# Patient Record
Sex: Female | Born: 1955 | Race: Black or African American | Hispanic: No | Marital: Single | Smoking: Former smoker
Health system: Southern US, Community
[De-identification: ages and names within clinical notes are randomized; demographics above are authoritative.]

## PROBLEM LIST (undated history)

## (undated) DIAGNOSIS — C801 Malignant (primary) neoplasm, unspecified: Secondary | ICD-10-CM

## (undated) HISTORY — PX: TRACHEOSTOMY: SUR1362

---

## 1999-11-04 ENCOUNTER — Encounter: Payer: Self-pay | Admitting: Emergency Medicine

## 2000-10-04 ENCOUNTER — Encounter: Payer: Self-pay | Admitting: Otolaryngology

## 2000-10-04 ENCOUNTER — Inpatient Hospital Stay (HOSPITAL_COMMUNITY): Admission: EM | Admit: 2000-10-04 | Discharge: 2000-10-05 | Payer: Self-pay | Admitting: Emergency Medicine

## 2000-10-04 ENCOUNTER — Encounter: Payer: Self-pay | Admitting: Emergency Medicine

## 2001-04-19 DIAGNOSIS — I10 Essential (primary) hypertension: Secondary | ICD-10-CM | POA: Insufficient documentation

## 2001-06-08 ENCOUNTER — Encounter: Payer: Self-pay | Admitting: *Deleted

## 2001-06-08 ENCOUNTER — Ambulatory Visit (HOSPITAL_COMMUNITY): Admission: RE | Admit: 2001-06-08 | Discharge: 2001-06-08 | Payer: Self-pay | Admitting: *Deleted

## 2002-09-15 ENCOUNTER — Emergency Department (HOSPITAL_COMMUNITY): Admission: EM | Admit: 2002-09-15 | Discharge: 2002-09-15 | Payer: Self-pay | Admitting: Emergency Medicine

## 2005-07-27 ENCOUNTER — Ambulatory Visit: Payer: Self-pay | Admitting: Internal Medicine

## 2005-09-30 ENCOUNTER — Ambulatory Visit: Payer: Self-pay | Admitting: Family Medicine

## 2005-12-28 ENCOUNTER — Ambulatory Visit: Payer: Self-pay | Admitting: Family Medicine

## 2006-05-02 ENCOUNTER — Ambulatory Visit: Payer: Self-pay | Admitting: Family Medicine

## 2006-05-02 ENCOUNTER — Encounter (INDEPENDENT_AMBULATORY_CARE_PROVIDER_SITE_OTHER): Payer: Self-pay | Admitting: Family Medicine

## 2006-05-02 ENCOUNTER — Other Ambulatory Visit: Admission: RE | Admit: 2006-05-02 | Discharge: 2006-05-02 | Payer: Self-pay | Admitting: Family Medicine

## 2006-09-28 ENCOUNTER — Ambulatory Visit: Payer: Self-pay | Admitting: Family Medicine

## 2007-03-09 ENCOUNTER — Encounter (INDEPENDENT_AMBULATORY_CARE_PROVIDER_SITE_OTHER): Payer: Self-pay | Admitting: Family Medicine

## 2007-03-14 ENCOUNTER — Encounter (INDEPENDENT_AMBULATORY_CARE_PROVIDER_SITE_OTHER): Payer: Self-pay | Admitting: Family Medicine

## 2007-05-25 ENCOUNTER — Telehealth (INDEPENDENT_AMBULATORY_CARE_PROVIDER_SITE_OTHER): Payer: Self-pay | Admitting: *Deleted

## 2007-06-08 ENCOUNTER — Encounter (INDEPENDENT_AMBULATORY_CARE_PROVIDER_SITE_OTHER): Payer: Self-pay | Admitting: *Deleted

## 2007-06-12 ENCOUNTER — Ambulatory Visit: Payer: Self-pay | Admitting: Family Medicine

## 2007-06-12 DIAGNOSIS — J309 Allergic rhinitis, unspecified: Secondary | ICD-10-CM | POA: Insufficient documentation

## 2007-06-12 DIAGNOSIS — E785 Hyperlipidemia, unspecified: Secondary | ICD-10-CM

## 2007-06-16 ENCOUNTER — Ambulatory Visit (HOSPITAL_COMMUNITY): Admission: RE | Admit: 2007-06-16 | Discharge: 2007-06-16 | Payer: Self-pay | Admitting: Family Medicine

## 2007-06-21 LAB — CONVERTED CEMR LAB
CO2: 24 meq/L (ref 19–32)
Calcium: 10 mg/dL (ref 8.4–10.5)
Cholesterol: 261 mg/dL — ABNORMAL HIGH (ref 0–200)
Eosinophils Relative: 2 % (ref 0–5)
Glucose, Bld: 98 mg/dL (ref 70–99)
HCT: 38.4 % (ref 36.0–46.0)
Hemoglobin: 12.7 g/dL (ref 12.0–15.0)
Lymphocytes Relative: 33 % (ref 12–46)
Lymphs Abs: 3.3 10*3/uL (ref 0.7–4.0)
Monocytes Absolute: 0.7 10*3/uL (ref 0.1–1.0)
Platelets: 335 10*3/uL (ref 150–400)
Sodium: 143 meq/L (ref 135–145)
Total Bilirubin: 0.5 mg/dL (ref 0.3–1.2)
Total Protein: 8.3 g/dL (ref 6.0–8.3)
Triglycerides: 253 mg/dL — ABNORMAL HIGH (ref <150)
VLDL: 51 mg/dL — ABNORMAL HIGH (ref 0–40)
WBC: 10 10*3/uL (ref 4.0–10.5)

## 2007-06-26 ENCOUNTER — Encounter: Admission: RE | Admit: 2007-06-26 | Discharge: 2007-06-26 | Payer: Self-pay | Admitting: Family Medicine

## 2007-07-04 ENCOUNTER — Encounter: Admission: RE | Admit: 2007-07-04 | Discharge: 2007-07-04 | Payer: Self-pay | Admitting: Family Medicine

## 2007-07-04 ENCOUNTER — Encounter (INDEPENDENT_AMBULATORY_CARE_PROVIDER_SITE_OTHER): Payer: Self-pay | Admitting: Diagnostic Radiology

## 2007-07-04 ENCOUNTER — Encounter (INDEPENDENT_AMBULATORY_CARE_PROVIDER_SITE_OTHER): Payer: Self-pay | Admitting: Family Medicine

## 2007-09-22 ENCOUNTER — Encounter (INDEPENDENT_AMBULATORY_CARE_PROVIDER_SITE_OTHER): Payer: Self-pay | Admitting: Family Medicine

## 2007-10-25 ENCOUNTER — Telehealth (INDEPENDENT_AMBULATORY_CARE_PROVIDER_SITE_OTHER): Payer: Self-pay | Admitting: Family Medicine

## 2008-02-20 ENCOUNTER — Encounter (INDEPENDENT_AMBULATORY_CARE_PROVIDER_SITE_OTHER): Payer: Self-pay | Admitting: Family Medicine

## 2008-02-26 ENCOUNTER — Telehealth (INDEPENDENT_AMBULATORY_CARE_PROVIDER_SITE_OTHER): Payer: Self-pay | Admitting: Family Medicine

## 2008-03-18 ENCOUNTER — Ambulatory Visit: Payer: Self-pay | Admitting: Family Medicine

## 2008-03-18 LAB — CONVERTED CEMR LAB
AST: 11 units/L (ref 0–37)
Alkaline Phosphatase: 67 units/L (ref 39–117)
BUN: 15 mg/dL (ref 6–23)
Basophils Relative: 0 % (ref 0–1)
Creatinine, Ser: 0.85 mg/dL (ref 0.40–1.20)
Eosinophils Absolute: 0.4 10*3/uL (ref 0.0–0.7)
Eosinophils Relative: 3 % (ref 0–5)
Glucose, Bld: 97 mg/dL (ref 70–99)
HCT: 39.2 % (ref 36.0–46.0)
Hemoglobin: 12.8 g/dL (ref 12.0–15.0)
MCHC: 32.7 g/dL (ref 30.0–36.0)
MCV: 88.1 fL (ref 78.0–100.0)
Monocytes Absolute: 0.8 10*3/uL (ref 0.1–1.0)
Monocytes Relative: 6 % (ref 3–12)
Neutrophils Relative %: 56 % (ref 43–77)
RBC: 4.45 M/uL (ref 3.87–5.11)

## 2008-03-22 ENCOUNTER — Encounter (INDEPENDENT_AMBULATORY_CARE_PROVIDER_SITE_OTHER): Payer: Self-pay | Admitting: Family Medicine

## 2008-03-28 ENCOUNTER — Telehealth (INDEPENDENT_AMBULATORY_CARE_PROVIDER_SITE_OTHER): Payer: Self-pay | Admitting: *Deleted

## 2008-04-03 ENCOUNTER — Encounter (INDEPENDENT_AMBULATORY_CARE_PROVIDER_SITE_OTHER): Payer: Self-pay | Admitting: *Deleted

## 2008-04-05 ENCOUNTER — Telehealth (INDEPENDENT_AMBULATORY_CARE_PROVIDER_SITE_OTHER): Payer: Self-pay | Admitting: *Deleted

## 2008-04-30 ENCOUNTER — Ambulatory Visit: Payer: Self-pay | Admitting: Cardiovascular Disease

## 2008-05-02 ENCOUNTER — Encounter (INDEPENDENT_AMBULATORY_CARE_PROVIDER_SITE_OTHER): Payer: Self-pay | Admitting: Family Medicine

## 2008-05-15 ENCOUNTER — Encounter: Payer: Self-pay | Admitting: Cardiovascular Disease

## 2008-05-15 ENCOUNTER — Ambulatory Visit: Payer: Self-pay

## 2008-06-14 ENCOUNTER — Telehealth (INDEPENDENT_AMBULATORY_CARE_PROVIDER_SITE_OTHER): Payer: Self-pay | Admitting: *Deleted

## 2008-08-02 ENCOUNTER — Encounter (INDEPENDENT_AMBULATORY_CARE_PROVIDER_SITE_OTHER): Payer: Self-pay | Admitting: Family Medicine

## 2008-08-15 ENCOUNTER — Encounter (INDEPENDENT_AMBULATORY_CARE_PROVIDER_SITE_OTHER): Payer: Self-pay | Admitting: Family Medicine

## 2008-08-16 ENCOUNTER — Encounter (INDEPENDENT_AMBULATORY_CARE_PROVIDER_SITE_OTHER): Payer: Self-pay | Admitting: Family Medicine

## 2008-09-05 ENCOUNTER — Encounter (INDEPENDENT_AMBULATORY_CARE_PROVIDER_SITE_OTHER): Payer: Self-pay | Admitting: Family Medicine

## 2008-10-01 ENCOUNTER — Encounter (INDEPENDENT_AMBULATORY_CARE_PROVIDER_SITE_OTHER): Payer: Self-pay | Admitting: Nurse Practitioner

## 2008-10-02 ENCOUNTER — Encounter (INDEPENDENT_AMBULATORY_CARE_PROVIDER_SITE_OTHER): Payer: Self-pay | Admitting: Nurse Practitioner

## 2008-11-19 ENCOUNTER — Telehealth (INDEPENDENT_AMBULATORY_CARE_PROVIDER_SITE_OTHER): Payer: Self-pay | Admitting: Nurse Practitioner

## 2008-11-20 ENCOUNTER — Encounter (INDEPENDENT_AMBULATORY_CARE_PROVIDER_SITE_OTHER): Payer: Self-pay | Admitting: *Deleted

## 2008-12-16 ENCOUNTER — Encounter (INDEPENDENT_AMBULATORY_CARE_PROVIDER_SITE_OTHER): Payer: Self-pay | Admitting: *Deleted

## 2009-01-03 ENCOUNTER — Encounter (INDEPENDENT_AMBULATORY_CARE_PROVIDER_SITE_OTHER): Payer: Self-pay | Admitting: *Deleted

## 2009-01-14 ENCOUNTER — Encounter: Payer: Self-pay | Admitting: Physician Assistant

## 2009-02-19 ENCOUNTER — Telehealth: Payer: Self-pay | Admitting: Physician Assistant

## 2009-02-19 ENCOUNTER — Ambulatory Visit (HOSPITAL_COMMUNITY): Admission: RE | Admit: 2009-02-19 | Discharge: 2009-02-19 | Payer: Self-pay | Admitting: Surgery

## 2009-02-19 ENCOUNTER — Encounter (INDEPENDENT_AMBULATORY_CARE_PROVIDER_SITE_OTHER): Payer: Self-pay | Admitting: Surgery

## 2009-02-19 ENCOUNTER — Encounter: Admission: RE | Admit: 2009-02-19 | Discharge: 2009-02-19 | Payer: Self-pay | Admitting: Surgery

## 2009-02-24 ENCOUNTER — Telehealth: Payer: Self-pay | Admitting: Cardiovascular Disease

## 2009-03-18 ENCOUNTER — Telehealth: Payer: Self-pay | Admitting: Physician Assistant

## 2009-04-22 ENCOUNTER — Ambulatory Visit: Payer: Self-pay | Admitting: Physician Assistant

## 2009-04-22 DIAGNOSIS — G4700 Insomnia, unspecified: Secondary | ICD-10-CM

## 2009-04-24 ENCOUNTER — Encounter: Payer: Self-pay | Admitting: Physician Assistant

## 2009-10-09 ENCOUNTER — Telehealth: Payer: Self-pay | Admitting: Physician Assistant

## 2009-10-15 ENCOUNTER — Ambulatory Visit: Payer: Self-pay | Admitting: Physician Assistant

## 2009-10-16 LAB — CONVERTED CEMR LAB
ALT: 19 units/L (ref 0–35)
AST: 21 units/L (ref 0–37)
Alkaline Phosphatase: 59 units/L (ref 39–117)
BUN: 17 mg/dL (ref 6–23)
Calcium: 9.9 mg/dL (ref 8.4–10.5)
Chloride: 104 meq/L (ref 96–112)
Creatinine, Ser: 0.96 mg/dL (ref 0.40–1.20)
HDL: 47 mg/dL (ref >39)
LDL Cholesterol: 157 mg/dL — ABNORMAL HIGH (ref 0–99)
Potassium: 4.2 meq/L (ref 3.5–5.3)
Total CHOL/HDL Ratio: 5.2

## 2009-11-18 ENCOUNTER — Ambulatory Visit: Payer: Self-pay | Admitting: Physician Assistant

## 2010-04-16 ENCOUNTER — Telehealth (INDEPENDENT_AMBULATORY_CARE_PROVIDER_SITE_OTHER): Payer: Self-pay | Admitting: Internal Medicine

## 2010-05-09 ENCOUNTER — Encounter: Payer: Self-pay | Admitting: Internal Medicine

## 2010-05-10 ENCOUNTER — Encounter: Payer: Self-pay | Admitting: Surgery

## 2010-05-17 LAB — CONVERTED CEMR LAB
AST: 16 units/L (ref 0–37)
Albumin: 4.8 g/dL (ref 3.5–5.2)
BUN: 10 mg/dL (ref 6–23)
Calcium: 9.6 mg/dL (ref 8.4–10.5)
Chloride: 102 meq/L (ref 96–112)
Glucose, Bld: 92 mg/dL (ref 70–99)
Potassium: 3.8 meq/L (ref 3.5–5.3)
Sodium: 141 meq/L (ref 135–145)
Total Protein: 7.9 g/dL (ref 6.0–8.3)

## 2010-05-21 NOTE — Assessment & Plan Note (Signed)
Summary: FU FOR CHOL AND BP///KT   Vital Signs:  Patient profile:   55 year old female Height:      64 inches Weight:      264 pounds BMI:     45.48 Temp:     97.9 degrees F oral Pulse rate:   96 / minute Pulse rhythm:   regular Resp:     18 per minute BP sitting:   127 / 87  (left arm) Cuff size:   large  Vitals Entered By: Armenia Shannon (November 18, 2009 9:54 AM) CC: Hypertension Management Is Patient Diabetic? No Pain Assessment Patient in pain? no       Does patient need assistance? Functional Status Self care Ambulation Normal   Primary Care Aine Strycharz:  Tereso Newcomer PA-C  CC:  Hypertension Management.  History of Present Illness: Here for f/u.  Had a hard time finding out if she was taking crestor or not.  At one point, told the nurses she was taking Tricor.  Now, states she took Crestor.  She may have missed some days.  I could never find a record that she tried anything else.  She had crestor increased to 40mg  in 2009 when her Lipid panel returned abnl.  Her LDL then was over 140.  Recently, it was 150 and patient states she was taking.  Sounds like she may be missing some doses.    Hypertension History:      She denies headache, chest pain, dyspnea with exertion, and syncope.  She notes no problems with any antihypertensive medication side effects.        Positive major cardiovascular risk factors include hyperlipidemia and hypertension.  Negative major cardiovascular risk factors include female age less than 61 years old and non-tobacco-user status.     Current Medications (verified): 1)  Lisinopril-Hydrochlorothiazide 20-25 Mg  Tabs (Lisinopril-Hydrochlorothiazide) .... Take 1 Tab By Mouth Daily 2)  Crestor 40 Mg  Tabs (Rosuvastatin Calcium) .Marland Kitchen.. 1 Tablet By Mouth Nightly For Cholesterol **needs Lab Visit** 3)  Loratadine 10 Mg  Tabs (Loratadine) .Marland Kitchen.. 1 Tablet By Mouth Daily For Allergies 4)  Flonase 50 Mcg/act  Susp (Fluticasone Propionate) .... Use 2 Sprays To  Each Nostril Once Daily (Substituted For Veramyst) 5)  Trazodone Hcl 50 Mg Tabs (Trazodone Hcl) .... One Tablet By Mouth Nightly As Needed For Sleep **pharmacy - Discontinue Ambien**  Allergies (verified): No Known Drug Allergies  Physical Exam  General:  alert, well-developed, and well-nourished.   Head:  normocephalic and atraumatic.   Neck:  tracheostomy noted Lungs:  normal breath sounds, no crackles, and no wheezes.   Heart:  regular rhythm and no murmur.   Abdomen:  soft and non-tender.   Neurologic:  alert & oriented X3 and cranial nerves II-XII intact.   Psych:  normally interactive.     Impression & Recommendations:  Problem # 1:  HYPERLIPIDEMIA (ICD-272.4) change to Simvastatin 40 will have her see dietician repeat FLP and LFTs in 3 mos  The following medications were removed from the medication list:    Crestor 40 Mg Tabs (Rosuvastatin calcium) .Marland Kitchen... 1 tablet by mouth nightly for cholesterol **needs lab visit** Her updated medication list for this problem includes:    Simvastatin 40 Mg Tabs (Simvastatin) .Marland Kitchen... Take 1 tab by mouth at bedtime for cholesterol  Problem # 2:  HYPERTENSION (ICD-401.9) controlled  Her updated medication list for this problem includes:    Lisinopril-hydrochlorothiazide 20-25 Mg Tabs (Lisinopril-hydrochlorothiazide) .Marland Kitchen... Take 1 tab by mouth daily  Problem # 3:  Preventive Health Care (ICD-V70.0)  needs CPP  schedule mammo  Orders: Mammogram (Screening) (Mammo)  Complete Medication List: 1)  Lisinopril-hydrochlorothiazide 20-25 Mg Tabs (Lisinopril-hydrochlorothiazide) .... Take 1 tab by mouth daily 2)  Loratadine 10 Mg Tabs (Loratadine) .Marland Kitchen.. 1 tablet by mouth daily for allergies 3)  Flonase 50 Mcg/act Susp (Fluticasone propionate) .... Use 2 sprays to each nostril once daily (substituted for veramyst) 4)  Trazodone Hcl 50 Mg Tabs (Trazodone hcl) .... One tablet by mouth nightly as needed for sleep **pharmacy - discontinue  ambien** 5)  Simvastatin 40 Mg Tabs (Simvastatin) .... Take 1 tab by mouth at bedtime for cholesterol  Hypertension Assessment/Plan:      The patient's hypertensive risk group is category B: At least one risk factor (excluding diabetes) with no target organ damage.  Her calculated 10 year risk of coronary heart disease is 8 %.  Today's blood pressure is 127/87.  Her blood pressure goal is < 140/90.  Patient Instructions: 1)  Please schedule a follow-up appointment in 3 months with Scott for CPP.  Come fasting for lab work. 2)  You are now on Simvastatin 40 mg at bedtime for cholesterol.  Take every day.  Please do not miss any doses. Prescriptions: SIMVASTATIN 40 MG TABS (SIMVASTATIN) Take 1 tab by mouth at bedtime for cholesterol  #30 x 5   Entered and Authorized by:   Tereso Newcomer PA-C   Signed by:   Tereso Newcomer PA-C on 11/18/2009   Method used:   Print then Give to Patient   RxID:   [id number]

## 2010-05-21 NOTE — Progress Notes (Signed)
Summary: Crestor  Phone Note [employer] of Call: Received prior auth. for crestor. I need her to: 1.  Come in for FLP and CMET. 2.  Ask if she has been taking Crestor 40 mg every day. 3.  F/u office visit. Marland Kitchen . I was supposed to see her around Feb in f/u for BP. . . .never did. Initial call taken by: Brynda Rim,  October 09, 2009 9:38 AM  Follow-up for Phone Call        Pt. is currently taking Crestor 40 mg  Daily. pt. scheduled for labs. Will schedule F/U for HTN with you when she comes for labs. Follow-up by: Gaylyn Cheers RN,  October 10, 2009 11:55 AM  Additional Follow-up for Phone Call Additional follow up Details #1::        Put PA form in your slot. Please check paper chart to see if she was ever on simvastatin or something different and why she was switched to Crestor. If tried simvastatin in past, answer yes to #1. Additional Follow-up by: Tereso Newcomer PA-C,  October 10, 2009 1:16 PM    Additional Follow-up for Phone Call Additional follow up Details #2::    Per paper chart, has never been on anything other than Crestor.  Dutch Quint RN  October 10, 2009 3:04 PM

## 2010-05-21 NOTE — Letter (Signed)
Summary: *HSN Results Follow up  [facility]  [address] [place], [place] [address]   Phone: [phone]  Fax: [phone]      04/24/2009   Catherine Erickson [address] [place], [place]  [address]   Dear  [name],                            ____S.Drinkard,FNP   ____D. Gore,FNP       ____B. McPherson,MD   ____V. Rankins,MD    ____E. Mulberry,MD    ____N. Daphine Deutscher, FNP  ____D. Reche Dixon, MD    ____K. Philipp Deputy, MD    _x___S. Alben Spittle, PA-C     This letter is to inform you that your recent test(s):  _______Pap Smear    ___x____Lab Test     _______X-ray    ____x___ is within acceptable limits  _______ requires a medication change  _______ requires a follow-up lab visit  _______ requires a follow-up visit with your provider   Comments:       _________________________________________________________ If you have any questions, please contact our office                     Sincerely,  Tereso Newcomer PA-C [facility]

## 2010-05-21 NOTE — Progress Notes (Signed)
Summary: Query:  Refill trazodone?  Phone Note Outgoing Call   Summary of Call: Last seen 11/2009.  Refill trazodone per protocol or with no refills? Initial call taken by: Dutch Quint RN,  April 16, 2010 5:02 PM  Follow-up for Phone Call        Refill until August of 2012 Follow-up by: Julieanne Manson MD,  April 21, 2010 9:04 AM  Additional Follow-up for Phone Call Additional follow up Details #1::        Noted - refills completed. Additional Follow-up by: Dutch Quint RN,  April 21, 2010 2:58 PM
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
## 2010-07-22 LAB — COMPREHENSIVE METABOLIC PANEL
ALT: 24 U/L (ref 0–35)
AST: 19 U/L (ref 0–37)
Albumin: 4.3 g/dL (ref 3.5–5.2)
Alkaline Phosphatase: 74 U/L (ref 39–117)
CO2: 30 mEq/L (ref 19–32)
Chloride: 101 mEq/L (ref 96–112)
GFR calc Af Amer: 57 mL/min — ABNORMAL LOW (ref >60)
GFR calc non Af Amer: 47 mL/min — ABNORMAL LOW (ref >60)
Potassium: 4 mEq/L (ref 3.5–5.1)
Sodium: 140 mEq/L (ref 135–145)
Total Bilirubin: 0.4 mg/dL (ref 0.3–1.2)

## 2010-07-22 LAB — DIFFERENTIAL
Basophils Absolute: 0 10*3/uL (ref 0.0–0.1)
Basophils Relative: 0 % (ref 0–1)
Eosinophils Absolute: 0.4 10*3/uL (ref 0.0–0.7)
Eosinophils Relative: 3 % (ref 0–5)
Lymphocytes Relative: 41 % (ref 12–46)
Monocytes Absolute: 0.9 10*3/uL (ref 0.1–1.0)

## 2010-07-22 LAB — CBC
MCV: 87.3 fL (ref 78.0–100.0)
Platelets: 342 10*3/uL (ref 150–400)
RBC: 4.57 MIL/uL (ref 3.87–5.11)
WBC: 11.4 10*3/uL — ABNORMAL HIGH (ref 4.0–10.5)
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
## 2010-09-04 NOTE — H&P (Signed)
[facility]  Patient:    Catherine Erickson, KRETZSCHMAR                     MRN: [id number] Adm. Date:  [id number] Attending:  Corliss Marcus                         History and Physical  HISTORY OF PRESENT ILLNESS:   Ms. Laton is a 55 year old black female, patient of [facility], who is admitted with shortness of breath. She first had some cough and shortness of breath in November of last year. She saw medical care at [facility] and a chest x-ray was reportedly normal. She was treated with a cough syrup and prednisone several times from November of 2000 until March of 2001.  Her symptoms would improve during that time and then worsen again.  Last week it started again but it was much worse.  She was treated at [facility] and treated with Claritin and albuterol.  She was wheezing at that time.  Shortness of breath worsened so she came to the emergency room today.  She denies chest pain.  Last week she did begin to note paroxysmal nocturnal dyspnea and orthopnea.  Yesterday she noted swelling in her ankles.  Since arrival in the ER she has been treated with intravenous Lasix and she has been urinating a lot and feels better.  From November of 2000 to March of 2001 her blood pressure was quite variable, high at times.  She recalls that in 1988 or 1999 her blood pressure was up at the [facility]. Discontinuation of oral contraceptives agents was recommended, however, she was restarted on them subsequently.  PAST MEDICAL HISTORY:         Surgical:  None.  ALLERGIES:                    None known.  MEDICATIONS:                  See above.  SOCIAL HISTORY:               Cigarettes, none.  Alcohol, none.  She works for [employer] in [employer] [place].  She lives with her son.  FAMILY HISTORY:               Father died, age 72 of myocardial infarction. Brother died, age 65 of apparent myocardial infarction.  Mother had hypertension.   There is diabetes remotely in the family.  REVIEW OF SYSTEMS:            Otherwise negative.  PHYSICAL EXAMINATION:  VITAL SIGNS:                  Blood pressure 130/100, pulse 16, respiratory rate 18, temperature 97.2.  She is comfortable.  HEENT:                        The eyes have pupils that are equal, round, and reactive to light.  Extraocular movements are intact.  The funduscopic examination reveals minimal arterial wiring and no AV nicking.  The ear, nose and throat are unremarkable.  NECK:                         Supple.  The thyroid is not enlarged or tender.   CHEST:  Clear to auscultation and percussion.  HEART:                        Regular rate and rhythm with a normal S1 and S2, without an S3 or S4.  There is a 2/6 holosystolic murmur at the apex.  Trace edema is present.  ABDOMEN:                      Soft and nontender without hepatosplenomegaly or mass.  NEUROLOGICAL:                 Oriented x 3.  Cranial nerves are intact.  Motor is 5/5.  LABORATORY DATA:              Pending.  ECG shows a sinus tachycardia with no acute changes.  Chest x-ray reveals a dilated heart silhouette.  There is interstitial edema.  IMPRESSION:                   New onset congestive heart failure. Differential diagnosis includes ischemia, viral, toxic, valvular, and hypertensive.  I doubt ischemic toxic.  I suspect her holosystolic murmur is mitral regurgitation and this is secondary not a primary problem.  I will obtain an echocardiogram, continue Lasix, add ACE inhibitor, beta blocker, possibly Aldactone, and possibly Coumadin depending on results of ultrasound. D:  11/04/99 TD:  11/06/99 Job: [id number]
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
## 2010-09-04 NOTE — Discharge Summary (Signed)
[place]  Patient:    AMEL, KITCH                     MRN: [id number] Adm. Date:  [id number] Disc. Date: [id number] Attending:  Corliss Marcus CC:         Richardine Service, M.D.                           Discharge Summary  ADMISSION DIAGNOSIS:  Shortness of breath.  DISCHARGE DIAGNOSES: 1. Congestive cardiomyopathy. 2. Mitral regurgitation, severe, secondary to congestive cardiomyopathy. 3. Iron deficiency anemia. 4. Elevated liver function tests presumed secondary to congestive heart    failure.  HISTORY OF PRESENT ILLNESS:  The patient is a 55 year old white female who presented to the [facility] Emergency Room with shortness of breath, cough, paroxysmal nocturnal dyspnea, and orthopnea of two weeks duration. She had had intermittent episodes of shortness of breath since October of last year.  Please see the admitting history and physical examination for details.  HOSPITAL COURSE:  The patient was admitted after a chest x-ray revealed cardiomegaly and evidence of mild pulmonary edema mainly of the interstitial type.  She was treated with Lasix and Capoten 6.25 mg daily.  Her initial blood pressure was 159/105 and her pulse was about 110.  With treatment with Lasix and Capoten, her shortness of breath improved and her heart rate dropped slightly and her blood pressure improved.  Toprol XL 25 mg one half tablet daily was added.  K-Dur was also added because of diuresis.  She was seen in consultation by Celso Sickle, M.D.  Along with the 2-D echocardiogram that I had ordered, he recommended a transesophageal echocardiogram to rule out the possibility of ruptured chorde tendineae as the cause of acute mitral regurgitation.  These studies were done.  The 2-D echocardiogram revealed an ejection fraction of 25-30%.  This was confirmed on the TEE.  There was global hypokinesis, 4+ mitral  regurgitation, and a morphologically intact mitral valve.  There was no flail segment and no myxomatous changes were noted. There were no thrombi.  She was in much improved condition and was discharged feeling much better.  DISCHARGE MEDICATIONS: 1. Lasix 40 mg daily. 2. Birth control pills as before. 3. Accupril 5 mg b.i.d. 4. Toprol XL 25 mg one half tablet daily. 5. Aspirin one daily. 6. Iron sulfate one daily. 7. Potassium in the form of K-Dur 20 mEq one daily.  DIET:  No added salt.  This was reviewed in detail with the patient.  ACTIVITY:  As tolerated.  She may return to work in two days.  FOLLOW-UP:  She will make an appointment to see Celso Sickle, M.D., next week.  Please note that at admission her hemoglobin was 11.4 with an MCV that was normal.  The RDW was elevated.  I checked folate, B12, and iron studies.  The studies all came back consistent with iron deficiency.  Therefore, she is to continue taking iron.  She did have mild elevations in liver function tests, but those improved during the hospitalization. DD:  11/07/99 TD:  11/10/99 Job: [id number]
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
## 2010-09-04 NOTE — Consult Note (Signed)
[facility]  Patient:    Catherine Erickson, Catherine Erickson                     MRN: [id number] Proc. Date: 11/05/99 Adm. Date:  [id number] Attending:  Darnelle Bos CC:         Winn Jock. Earl Gala, M.D.             [facility]                          Consultation Report  CONCLUSIONS: 1. Congestive heart failure and rather significant-sounding murmur of mitral    regurgitation (by examination).  Rule out myxomatous mitral valve    disease/mitral valve prolapse with caudal rupture causing significant    mitral regurgitation.  Rule out other etiologies of CHF, including    idiopathic/post viral cardiomyopathy, hypertension, ischemic, toxic,    and others. 2. Hypertension.  RECOMMENDATION: 1. A 2-D Doppler echocardiogram to rule out mitral valve prolapse/caudal    rupture and quantitative severity of mitral regurgitation.  TEE may be    necessary. 2. Aggressive diuresis. 3. Optimize ACE inhibitor therapy. 4. Very low-dose beta blocker therapy until more information about etiology    of CHF is obtained. 5. Main need left and right heart catheterization to evaluate full hemodynamic    consequences of the patients mitral regurgitation. 6. TSH to rule out occult thyrotoxicosis.  COMMENTS:  The patient is 55 years of age and gives the history that she abruptly developed shortness of breath in November 2000.  She was seen at [facility] and felt to have allergies, and treated with Claritin and bronchodilators.  Since November, her breathing has not been right.  She initially had exertional dyspnea.  Over the past two weeks, she has noticed orthopnea, PND, and lower extremity swelling.  She came to the emergency room last evening, where chest x-ray revealed cardiomegaly and pulmonary edema. She was admitted by Dr. Earl Gala and begun on diuresis and now feels significant better, although not normal.  She has no prior history of heart murmur,  heart disease, rheumatic fever.  She does not use drugs.  She denies ethanol consumption.  She has no history of thyroid disease.  FAMILY HISTORY:  There is a family history of heart disease with her father dying of myocardial infarction at 10 and a brother dying abruptly, possibly of a heart attack, at age 8.  HABITS:  She denies ethanol consumption and does not smoke cigarettes.  SIGNIFICANT MEDICAL ILLNESSES:  Labile blood pressure elevation.  ALLERGIES:  None.  SOCIAL HISTORY:  She is not married.  She has a 74 year old child.  She had no cardiovascular difficulties during a full-term pregnancy.  REVIEW OF SYSTEMS:  Denies hemoptysis.  Has not had palpitations.  Denies syncope.  She has noticed some abdominal swelling that has coincided with lower extremity edema.  She denies chest discomfort.  There is no history of pulmonary disease, DVT, or thromboembolism.  PHYSICAL EXAMINATION:  GENERAL:  The patient is in no acute distress.  VITAL SIGNS:  Blood pressure 138/92, heart rate 110.  There is no orthostasis or paradox.  She is afebrile with temperature of 98.7.  SKIN:  Clear.  No cyanosis is noted on examination of the nail beds.  HEENT:  Reveals pupils are equal and reactive.  No xanthelasmas noted.  NECK:  Reveals no obvious thyromegaly.  I am unable to appreciate significant JVD with  the patient sitting at 90 degrees, but lying at 45 degrees there is JVD to the angle of the jaw.  No carotid bruits are heard.  LUNGS:  Essentially clear.  No wheezing is appreciated.  CARDIAC:  Remarkable for a very active precordium.  PMI is laterally displaced into the sixth intercostal space anterior to the anterior axillary line. There is a 2-3/6 holosystolic murmur that is heard at the apex and radiates into the axilla.  I am unable to hear this murmur over the spine.  She has a summation gallop heard along the left sternal border.  No diastolic murmurs are heard.  ABDOMEN:   Reveals no obvious liver or spleen enlargement.  The abdomen is obese.  No pulsatile masses.  Bowel sounds are normal.  EXTREMITIES:  Reveal 1+ edema.  Posterior tibial, popliteal, femoral, and radial pulses are 2+.  NEUROLOGIC:  Reveals a patient who is intact with reference to memory, orientation, cranial nerves, and motor function.  LABORATORY DATA:  EKG reveals LVH with strain, left atrial enlargement, and sinus tachycardia, nonspecific T wave abnormality.  Chest x-ray reveals cardiomegaly and pulmonary edema.  Significant laboratory data includes hemoglobin of 10.5, WBC 9.0.  Platelet count 385,000.  Potassium is 4.6, BUN 9, creatinine 0.8.  CK 89.  Echo is pending.  DISCUSSION:  I am concerned that this patient abruptly developed significant mitral regurgitation in November and has developed progressive left ventricular dysfunction due to severe mitral regurgitation since that time, resulting in this present admission with congestive heart failure. Echocardiogram will help to determine the severity of mitral regurgitation. TEE may be necessary, and if we determine that there is a structural mitral valve abnormality, she may need left and right heart catheterization and consideration of mitral valve repair or replacement.  In the meantime, I would aggressively treat the patient medically with diuresis and optimization of ACE inhibitor therapy.  I would go very gingerly with beta blocker therapy until we have further information concerning the degree of mitral regurgitation, as tachycardia may be her only means of maintaining a good ______ cardiac output.  I would do a TSH to rule out occult thyrotoxicosis. DD:  11/05/99 TD:  11/05/99 Job: [id number]

## 2010-09-04 NOTE — Procedures (Signed)
[facility]  Patient:    Catherine Erickson, Catherine Erickson                     MRN: [id number] Proc. Date: 11/06/99 Adm. Date:  [id number] Disc. Date: [id number] Attending:  Corliss Marcus CC:         Celso Sickle, M.D.             Winn Jock. Earl Gala, M.D.             [facility] [phone]                           Procedure Report  PROCEDURE PERFORMED:  Transesophageal echocardiography.  SURGEON:  Francisca December, M.D.  ANESTHESIA:  INDICATIONS:  The patient is a 55 year old woman admitted with congestive heart failure.  Transthoracic echocardiogram has revealed significant mitral regurgitation with dilated hypocontractile LV.  There is question of possible flail segment of the mitral valve.  This study is undertaken to more clearly delineate the mitral valve pathology.  DESCRIPTION OF PROCEDURE:  Transesophageal echocardiography was carried out following successful intubation with the probe.  This was facilitated using 20% benzocaine posterior pharyngeal anesthesia and 4 mg of midazolam, 100 mcg of fentanyl administered intravenously.  O2 saturation, heart rate, blood pressure, and ECG were monitored throughout and remained stable.  FINDINGS:  The left ventricle was dilated and hypocontractile.  There is akinesis, most especially in the anterior portion towards the base consistent with possible induction disorder.  The myocardial segments are hypokinetic. The right ventricle and right atrium _________ and contracting adequately. The right-sided cardiac valves had normal morphology and no significant regurgitation.  The aortic valve was thin prior to trileaflet and opens adequately.  The mitral valve was thin, opens adequately, and had good diastolic excursion.  No evidence of prolapse, eczematous change, redundancy of her segment is seen.  There is no significant aortic regurgitation.  There is severe mitral regurgitation with turbulence within the left  atrium which is mild to moderately dilated.  There is systolic flow reversal seen in the pulmonary vein documented by pulse wave Doppler.  The inner atrial septum was intact by both 2-D and Doppler methods.  There was no pericardial abnormality noted.  FINAL IMPRESSION: 1. Severe mitral regurgitation. 2. Dilated hypocontractile left ventricle. 3. No evidence of significant mitral valve morphological change as noted    above. 4. Suspect degree of mitral regurgitation related to dilated left ventricle    as well as mitral annulus. DD:  11/06/99 TD:  11/10/99 Job: [id number]

## 2010-09-16 ENCOUNTER — Other Ambulatory Visit: Payer: Self-pay | Admitting: Physician Assistant

## 2010-10-15 ENCOUNTER — Other Ambulatory Visit: Payer: Self-pay | Admitting: Physician Assistant

## 2010-11-11 ENCOUNTER — Other Ambulatory Visit: Payer: Self-pay | Admitting: Physician Assistant

## 2016-10-02 ENCOUNTER — Emergency Department (HOSPITAL_COMMUNITY): Payer: Medicaid Other

## 2016-10-02 ENCOUNTER — Inpatient Hospital Stay (HOSPITAL_COMMUNITY)
Admission: EM | Admit: 2016-10-02 | Discharge: 2016-10-12 | DRG: 392 | Disposition: A | Discharge status: Home / Self Care | Payer: Medicaid Other | Attending: Thoracic Surgery (Cardiothoracic Vascular Surgery) | Admitting: Thoracic Surgery (Cardiothoracic Vascular Surgery)

## 2016-10-02 ENCOUNTER — Encounter (HOSPITAL_COMMUNITY): Payer: Self-pay | Admitting: Nurse Practitioner

## 2016-10-02 ENCOUNTER — Encounter (HOSPITAL_COMMUNITY)
Admission: EM | Disposition: A | Discharge status: Home / Self Care | Payer: Self-pay | Source: Home / Self Care | Attending: Thoracic Surgery (Cardiothoracic Vascular Surgery)

## 2016-10-02 ENCOUNTER — Inpatient Hospital Stay (HOSPITAL_COMMUNITY): Payer: Medicaid Other | Admitting: Anesthesiology

## 2016-10-02 ENCOUNTER — Inpatient Hospital Stay (HOSPITAL_COMMUNITY): Payer: Medicaid Other

## 2016-10-02 DIAGNOSIS — R0602 Shortness of breath: Secondary | ICD-10-CM | POA: Diagnosis not present

## 2016-10-02 DIAGNOSIS — E785 Hyperlipidemia, unspecified: Secondary | ICD-10-CM | POA: Diagnosis present

## 2016-10-02 DIAGNOSIS — E669 Obesity, unspecified: Secondary | ICD-10-CM | POA: Diagnosis present

## 2016-10-02 DIAGNOSIS — G4700 Insomnia, unspecified: Secondary | ICD-10-CM | POA: Diagnosis present

## 2016-10-02 DIAGNOSIS — N179 Acute kidney failure, unspecified: Secondary | ICD-10-CM | POA: Diagnosis present

## 2016-10-02 DIAGNOSIS — D638 Anemia in other chronic diseases classified elsewhere: Secondary | ICD-10-CM | POA: Diagnosis present

## 2016-10-02 DIAGNOSIS — T80212A Local infection due to central venous catheter, initial encounter: Secondary | ICD-10-CM

## 2016-10-02 DIAGNOSIS — Z930 Tracheostomy status: Secondary | ICD-10-CM

## 2016-10-02 DIAGNOSIS — J90 Pleural effusion, not elsewhere classified: Secondary | ICD-10-CM | POA: Diagnosis not present

## 2016-10-02 DIAGNOSIS — Z6841 Body Mass Index (BMI) 40.0 and over, adult: Secondary | ICD-10-CM

## 2016-10-02 DIAGNOSIS — I10 Essential (primary) hypertension: Secondary | ICD-10-CM | POA: Diagnosis present

## 2016-10-02 DIAGNOSIS — E870 Hyperosmolality and hypernatremia: Secondary | ICD-10-CM | POA: Diagnosis not present

## 2016-10-02 DIAGNOSIS — K222 Esophageal obstruction: Principal | ICD-10-CM | POA: Diagnosis present

## 2016-10-02 DIAGNOSIS — Z87891 Personal history of nicotine dependence: Secondary | ICD-10-CM

## 2016-10-02 DIAGNOSIS — K223 Perforation of esophagus: Secondary | ICD-10-CM | POA: Diagnosis present

## 2016-10-02 DIAGNOSIS — M7989 Other specified soft tissue disorders: Secondary | ICD-10-CM | POA: Diagnosis not present

## 2016-10-02 DIAGNOSIS — A419 Sepsis, unspecified organism: Secondary | ICD-10-CM

## 2016-10-02 DIAGNOSIS — R079 Chest pain, unspecified: Secondary | ICD-10-CM | POA: Diagnosis present

## 2016-10-02 DIAGNOSIS — J982 Interstitial emphysema: Secondary | ICD-10-CM | POA: Diagnosis present

## 2016-10-02 DIAGNOSIS — Z09 Encounter for follow-up examination after completed treatment for conditions other than malignant neoplasm: Secondary | ICD-10-CM

## 2016-10-02 DIAGNOSIS — J9811 Atelectasis: Secondary | ICD-10-CM

## 2016-10-02 HISTORY — PX: ESOPHAGOSCOPY: SHX5534

## 2016-10-02 HISTORY — PX: VIDEO BRONCHOSCOPY: SHX5072

## 2016-10-02 LAB — COMPREHENSIVE METABOLIC PANEL
ALK PHOS: 70 U/L (ref 38–126)
ALT: 15 U/L (ref 14–54)
ANION GAP: 11 (ref 5–15)
AST: 39 U/L (ref 15–41)
Albumin: 3.8 g/dL (ref 3.5–5.0)
BILIRUBIN TOTAL: 2.4 mg/dL — AB (ref 0.3–1.2)
BUN: 15 mg/dL (ref 6–20)
CALCIUM: 9.2 mg/dL (ref 8.9–10.3)
CO2: 23 mmol/L (ref 22–32)
CREATININE: 1.52 mg/dL — AB (ref 0.44–1.00)
Chloride: 105 mmol/L (ref 101–111)
GFR calc non Af Amer: 37 mL/min — ABNORMAL LOW (ref >60)
GFR, EST AFRICAN AMERICAN: 43 mL/min — AB (ref >60)
GLUCOSE: 123 mg/dL — AB (ref 65–99)
Potassium: 5.1 mmol/L (ref 3.5–5.1)
Sodium: 139 mmol/L (ref 135–145)
TOTAL PROTEIN: 9 g/dL — AB (ref 6.5–8.1)

## 2016-10-02 LAB — URINALYSIS, ROUTINE W REFLEX MICROSCOPIC
Bacteria, UA: NONE SEEN
Bilirubin Urine: NEGATIVE
GLUCOSE, UA: NEGATIVE mg/dL
Ketones, ur: 20 mg/dL — AB
Leukocytes, UA: NEGATIVE
NITRITE: NEGATIVE
PH: 5 (ref 5.0–8.0)
Protein, ur: 100 mg/dL — AB
SPECIFIC GRAVITY, URINE: 1.02 (ref 1.005–1.030)

## 2016-10-02 LAB — CBC WITH DIFFERENTIAL/PLATELET
BASOS ABS: 0 10*3/uL (ref 0.0–0.1)
BASOS PCT: 0 %
EOS ABS: 0 10*3/uL (ref 0.0–0.7)
Eosinophils Relative: 0 %
HCT: 38.2 % (ref 36.0–46.0)
HEMOGLOBIN: 13.3 g/dL (ref 12.0–15.0)
LYMPHS PCT: 8 %
Lymphs Abs: 2 10*3/uL (ref 0.7–4.0)
MCH: 27.5 pg (ref 26.0–34.0)
MCHC: 34.8 g/dL (ref 30.0–36.0)
MCV: 79.1 fL (ref 78.0–100.0)
MONO ABS: 2 10*3/uL — AB (ref 0.1–1.0)
Monocytes Relative: 8 %
NEUTROS ABS: 21.1 10*3/uL — AB (ref 1.7–7.7)
NEUTROS PCT: 84 %
PLATELETS: 360 10*3/uL (ref 150–400)
RBC: 4.83 MIL/uL (ref 3.87–5.11)
RDW: 15.4 % (ref 11.5–15.5)
WBC: 25.1 10*3/uL — ABNORMAL HIGH (ref 4.0–10.5)

## 2016-10-02 LAB — MRSA PCR SCREENING: MRSA by PCR: NEGATIVE

## 2016-10-02 LAB — TYPE AND SCREEN
ABO/RH(D): O POS
Antibody Screen: NEGATIVE

## 2016-10-02 LAB — APTT: aPTT: 41 seconds — ABNORMAL HIGH (ref 24–36)

## 2016-10-02 LAB — PROTIME-INR
INR: 1.25
PROTHROMBIN TIME: 15.8 s — AB (ref 11.4–15.2)

## 2016-10-02 LAB — ABO/RH: ABO/RH(D): O POS

## 2016-10-02 LAB — I-STAT CG4 LACTIC ACID, ED
LACTIC ACID, VENOUS: 0.83 mmol/L (ref 0.5–1.9)
Lactic Acid, Venous: 1.93 mmol/L (ref 0.5–1.9)

## 2016-10-02 SURGERY — BRONCHOSCOPY, VIDEO-ASSISTED
Anesthesia: General | Laterality: Right

## 2016-10-02 SURGERY — EGD (ESOPHAGOGASTRODUODENOSCOPY)
Anesthesia: General

## 2016-10-02 MED ORDER — IOPAMIDOL (ISOVUE-300) INJECTION 61%
INTRAVENOUS | Status: AC
  Filled 2016-10-02: qty 30

## 2016-10-02 MED ORDER — FENTANYL CITRATE (PF) 100 MCG/2ML IJ SOLN
INTRAMUSCULAR | Status: AC
  Administered 2016-10-02: 50 ug via INTRAVENOUS
  Filled 2016-10-02: qty 2

## 2016-10-02 MED ORDER — ONDANSETRON HCL 4 MG/2ML IJ SOLN: 4.0000 mg | Freq: Once | INTRAMUSCULAR | Status: DC | PRN

## 2016-10-02 MED ORDER — VANCOMYCIN HCL 10 G IV SOLR
1250.0000 mg | INTRAVENOUS | Status: DC
  Administered 2016-10-03 – 2016-10-07 (×5): 1250 mg via INTRAVENOUS
  Filled 2016-10-02 (×6): qty 1250

## 2016-10-02 MED ORDER — MIDAZOLAM HCL 2 MG/2ML IJ SOLN
INTRAMUSCULAR | Status: AC
Start: 2016-10-02 — End: 2016-10-02
  Filled 2016-10-02: qty 2

## 2016-10-02 MED ORDER — FENTANYL CITRATE (PF) 250 MCG/5ML IJ SOLN
INTRAMUSCULAR | Status: AC
  Filled 2016-10-02: qty 5

## 2016-10-02 MED ORDER — ONDANSETRON HCL 4 MG/2ML IJ SOLN
INTRAMUSCULAR | Status: DC | PRN
  Administered 2016-10-02: 4 mg via INTRAVENOUS

## 2016-10-02 MED ORDER — ARTIFICIAL TEARS OPHTHALMIC OINT
TOPICAL_OINTMENT | OPHTHALMIC | Status: DC | PRN
  Administered 2016-10-02: 1 via OPHTHALMIC

## 2016-10-02 MED ORDER — PIPERACILLIN-TAZOBACTAM 3.375 G IVPB 30 MIN
3.3750 g | INTRAVENOUS | Status: AC
  Administered 2016-10-02: 3.375 g via INTRAVENOUS
  Filled 2016-10-02: qty 50

## 2016-10-02 MED ORDER — ACETAMINOPHEN 500 MG PO TABS
1000.0000 mg | ORAL_TABLET | Freq: Once | ORAL | Status: AC
  Administered 2016-10-02: 1000 mg via ORAL
  Filled 2016-10-02: qty 2

## 2016-10-02 MED ORDER — PROPOFOL 10 MG/ML IV BOLUS
INTRAVENOUS | Status: AC
  Filled 2016-10-02: qty 40

## 2016-10-02 MED ORDER — LIDOCAINE HCL (CARDIAC) 20 MG/ML IV SOLN
INTRAVENOUS | Status: DC | PRN
  Administered 2016-10-02: 50 mg via INTRAVENOUS

## 2016-10-02 MED ORDER — PHENYLEPHRINE HCL 10 MG/ML IJ SOLN
INTRAVENOUS | Status: DC | PRN
  Administered 2016-10-02: 25 ug/min via INTRAVENOUS

## 2016-10-02 MED ORDER — PANTOPRAZOLE SODIUM 40 MG IV SOLR
40.0000 mg | INTRAVENOUS | Status: DC
  Administered 2016-10-02 – 2016-10-09 (×8): 40 mg via INTRAVENOUS
  Filled 2016-10-02 (×8): qty 40

## 2016-10-02 MED ORDER — METOPROLOL TARTRATE 5 MG/5ML IV SOLN
2.5000 mg | INTRAVENOUS | Status: DC | PRN
  Administered 2016-10-08 – 2016-10-11 (×7): 2.5 mg via INTRAVENOUS
  Filled 2016-10-02 (×7): qty 5

## 2016-10-02 MED ORDER — PIPERACILLIN-TAZOBACTAM 3.375 G IVPB: 3.3750 g | Freq: Four times a day (QID) | INTRAVENOUS | Status: DC

## 2016-10-02 MED ORDER — MORPHINE SULFATE (PF) 4 MG/ML IV SOLN
2.0000 mg | INTRAVENOUS | Status: DC | PRN
  Administered 2016-10-02: 2 mg via INTRAVENOUS

## 2016-10-02 MED ORDER — MORPHINE SULFATE (PF) 4 MG/ML IV SOLN
2.0000 mg | INTRAVENOUS | Status: DC | PRN
Start: 2016-10-02 — End: 2016-10-12
  Administered 2016-10-02 – 2016-10-08 (×45): 4 mg via INTRAVENOUS
  Administered 2016-10-08: 2 mg via INTRAVENOUS
  Administered 2016-10-08 – 2016-10-12 (×27): 4 mg via INTRAVENOUS
  Filled 2016-10-02 (×75): qty 1

## 2016-10-02 MED ORDER — PIPERACILLIN-TAZOBACTAM 3.375 G IVPB
3.3750 g | Freq: Three times a day (TID) | INTRAVENOUS | Status: AC
  Administered 2016-10-02 – 2016-10-09 (×22): 3.375 g via INTRAVENOUS
  Filled 2016-10-02 (×23): qty 50

## 2016-10-02 MED ORDER — FENTANYL CITRATE (PF) 100 MCG/2ML IJ SOLN
25.0000 ug | INTRAMUSCULAR | Status: DC | PRN
  Administered 2016-10-02 (×3): 50 ug via INTRAVENOUS

## 2016-10-02 MED ORDER — DEXMEDETOMIDINE HCL IN NACL 200 MCG/50ML IV SOLN
INTRAVENOUS | Status: AC
  Filled 2016-10-02: qty 50

## 2016-10-02 MED ORDER — MORPHINE SULFATE (PF) 2 MG/ML IV SOLN
4.0000 mg | Freq: Once | INTRAVENOUS | Status: AC
  Administered 2016-10-02: 4 mg via INTRAVENOUS
  Filled 2016-10-02: qty 2

## 2016-10-02 MED ORDER — PNEUMOCOCCAL VAC POLYVALENT 25 MCG/0.5ML IJ INJ
0.5000 mL | INJECTION | INTRAMUSCULAR | Status: AC
  Administered 2016-10-09: 0.5 mL via INTRAMUSCULAR
  Filled 2016-10-02: qty 0.5

## 2016-10-02 MED ORDER — IOPAMIDOL (ISOVUE-370) INJECTION 76%
80.0000 mL | Freq: Once | INTRAVENOUS | Status: AC | PRN
  Administered 2016-10-02: 80 mL via INTRAVENOUS

## 2016-10-02 MED ORDER — ROCURONIUM BROMIDE 100 MG/10ML IV SOLN
INTRAVENOUS | Status: DC | PRN
  Administered 2016-10-02: 50 mg via INTRAVENOUS

## 2016-10-02 MED ORDER — LACTATED RINGERS IV SOLN
INTRAVENOUS | Status: DC | PRN
  Administered 2016-10-02: 10:00:00 via INTRAVENOUS

## 2016-10-02 MED ORDER — HYDRALAZINE HCL 20 MG/ML IJ SOLN
5.0000 mg | Freq: Four times a day (QID) | INTRAMUSCULAR | Status: DC | PRN
  Administered 2016-10-02: 10 mg via INTRAVENOUS
  Filled 2016-10-02: qty 1

## 2016-10-02 MED ORDER — METOPROLOL TARTRATE 5 MG/5ML IV SOLN
5.0000 mg | Freq: Four times a day (QID) | INTRAVENOUS | Status: DC
  Administered 2016-10-02 – 2016-10-12 (×40): 5 mg via INTRAVENOUS
  Filled 2016-10-02 (×40): qty 5

## 2016-10-02 MED ORDER — VANCOMYCIN HCL 10 G IV SOLR
2000.0000 mg | Freq: Once | INTRAVENOUS | Status: AC
  Administered 2016-10-02: 2000 mg via INTRAVENOUS
  Filled 2016-10-02: qty 2000

## 2016-10-02 MED ORDER — CHLORHEXIDINE GLUCONATE CLOTH 2 % EX PADS
6.0000 | MEDICATED_PAD | Freq: Every day | CUTANEOUS | Status: DC
  Administered 2016-10-03 – 2016-10-11 (×8): 6 via TOPICAL

## 2016-10-02 MED ORDER — ONDANSETRON HCL 4 MG/2ML IJ SOLN
4.0000 mg | Freq: Once | INTRAMUSCULAR | Status: AC
  Administered 2016-10-02: 4 mg via INTRAVENOUS
  Filled 2016-10-02: qty 2

## 2016-10-02 MED ORDER — SUCCINYLCHOLINE CHLORIDE 20 MG/ML IJ SOLN
INTRAMUSCULAR | Status: DC | PRN
  Administered 2016-10-02: 200 mg via INTRAVENOUS

## 2016-10-02 MED ORDER — SODIUM CHLORIDE 0.9 % IV SOLN: INTRAVENOUS | Status: DC

## 2016-10-02 MED ORDER — SUGAMMADEX SODIUM 200 MG/2ML IV SOLN
INTRAVENOUS | Status: DC | PRN
  Administered 2016-10-02: 220 mg via INTRAVENOUS

## 2016-10-02 MED ORDER — 0.9 % SODIUM CHLORIDE (POUR BTL) OPTIME
TOPICAL | Status: DC | PRN
  Administered 2016-10-02: 2000 mL

## 2016-10-02 MED ORDER — SODIUM CHLORIDE 0.9% FLUSH
10.0000 mL | Freq: Two times a day (BID) | INTRAVENOUS | Status: DC
  Administered 2016-10-02 – 2016-10-08 (×11): 10 mL
  Administered 2016-10-08: 20 mL
  Administered 2016-10-09 – 2016-10-12 (×5): 10 mL

## 2016-10-02 MED ORDER — METOPROLOL TARTRATE 5 MG/5ML IV SOLN
5.0000 mg | Freq: Once | INTRAVENOUS | Status: AC
  Administered 2016-10-02: 5 mg via INTRAVENOUS
  Filled 2016-10-02: qty 5

## 2016-10-02 MED ORDER — ORAL CARE MOUTH RINSE
15.0000 mL | Freq: Two times a day (BID) | OROMUCOSAL | Status: DC
  Administered 2016-10-02 – 2016-10-12 (×17): 15 mL via OROMUCOSAL

## 2016-10-02 MED ORDER — PIPERACILLIN-TAZOBACTAM 3.375 G IVPB 30 MIN
3.3750 g | Freq: Once | INTRAVENOUS | Status: AC
  Administered 2016-10-02: 3.375 g via INTRAVENOUS
  Filled 2016-10-02: qty 50

## 2016-10-02 MED ORDER — MORPHINE SULFATE (PF) 4 MG/ML IV SOLN: 2.0000 mg | INTRAVENOUS | Status: DC | PRN

## 2016-10-02 MED ORDER — CHLORHEXIDINE GLUCONATE 0.12 % MT SOLN
15.0000 mL | Freq: Two times a day (BID) | OROMUCOSAL | Status: DC
  Administered 2016-10-02 – 2016-10-12 (×20): 15 mL via OROMUCOSAL
  Filled 2016-10-02 (×18): qty 15

## 2016-10-02 MED ORDER — MORPHINE SULFATE (PF) 4 MG/ML IV SOLN
2.0000 mg | INTRAVENOUS | Status: DC | PRN
  Administered 2016-10-02: 2 mg via INTRAVENOUS
  Filled 2016-10-02: qty 1

## 2016-10-02 MED ORDER — MORPHINE SULFATE (PF) 2 MG/ML IV SOLN: 2.0000 mg | INTRAVENOUS | Status: DC | PRN

## 2016-10-02 MED ORDER — PROPOFOL 10 MG/ML IV BOLUS
INTRAVENOUS | Status: DC | PRN
  Administered 2016-10-02: 140 mg via INTRAVENOUS

## 2016-10-02 MED ORDER — ALBUMIN HUMAN 5 % IV SOLN
INTRAVENOUS | Status: DC | PRN
  Administered 2016-10-02: 11:00:00 via INTRAVENOUS

## 2016-10-02 MED ORDER — IOPAMIDOL (ISOVUE-300) INJECTION 61%
30.0000 mL | Freq: Once | INTRAVENOUS | Status: AC | PRN
  Administered 2016-10-02: 30 mL via ORAL

## 2016-10-02 MED ORDER — SODIUM CHLORIDE 0.9 % IV SOLN
INTRAVENOUS | Status: DC
  Administered 2016-10-02: 15:00:00 via INTRAVENOUS
  Administered 2016-10-03: 1 mL via INTRAVENOUS
  Administered 2016-10-03: 125 mL/h via INTRAVENOUS
  Administered 2016-10-04: 100 mL/h via INTRAVENOUS

## 2016-10-02 MED ORDER — MIDAZOLAM HCL 5 MG/5ML IJ SOLN
INTRAMUSCULAR | Status: DC | PRN
  Administered 2016-10-02 (×2): 1 mg via INTRAVENOUS

## 2016-10-02 MED ORDER — DEXTROSE 5 % IV SOLN: 500.0000 mg | Freq: Once | INTRAVENOUS | Status: DC

## 2016-10-02 MED ORDER — SODIUM CHLORIDE 0.9% FLUSH: 10.0000 mL | INTRAVENOUS | Status: DC | PRN

## 2016-10-02 MED ORDER — HYDRALAZINE HCL 20 MG/ML IJ SOLN
5.0000 mg | INTRAMUSCULAR | Status: DC | PRN
  Administered 2016-10-07 – 2016-10-11 (×9): 10 mg via INTRAVENOUS
  Filled 2016-10-02 (×10): qty 1

## 2016-10-02 MED ORDER — IOPAMIDOL (ISOVUE-370) INJECTION 76%
INTRAVENOUS | Status: AC
  Filled 2016-10-02: qty 100

## 2016-10-02 MED ORDER — DEXTROSE 5 % IV SOLN: 1.0000 g | Freq: Once | INTRAVENOUS | Status: DC

## 2016-10-02 MED ORDER — FENTANYL CITRATE (PF) 100 MCG/2ML IJ SOLN
INTRAMUSCULAR | Status: DC | PRN
  Administered 2016-10-02 (×2): 25 ug via INTRAVENOUS
  Administered 2016-10-02: 50 ug via INTRAVENOUS

## 2016-10-02 SURGICAL SUPPLY — 73 items
ADH SKN CLS APL DERMABOND .7 (GAUZE/BANDAGES/DRESSINGS)
APPLIER CLIP ROT 10 11.4 M/L (STAPLE)
APR CLP MED LRG 11.4X10 (STAPLE)
BAG SPEC RTRVL LRG 6X4 10 (ENDOMECHANICALS)
CANISTER SUCT 3000ML PPV (MISCELLANEOUS) ×10 IMPLANT
CATH KIT ON Q 5IN SLV (PAIN MANAGEMENT) IMPLANT
CATH THORACIC 28FR (CATHETERS) IMPLANT
CATH THORACIC 28FR RT ANG (CATHETERS) IMPLANT
CATH THORACIC 36FR (CATHETERS) IMPLANT
CATH THORACIC 36FR RT ANG (CATHETERS) IMPLANT
CLIP APPLIE ROT 10 11.4 M/L (STAPLE) IMPLANT
CLIP TI MEDIUM 6 (CLIP) IMPLANT
CONN Y 3/8X3/8X3/8  BEN (MISCELLANEOUS) ×4
CONN Y 3/8X3/8X3/8 BEN (MISCELLANEOUS) ×6 IMPLANT
CONT SPEC 4OZ CLIKSEAL STRL BL (MISCELLANEOUS) ×20 IMPLANT
COVER SURGICAL LIGHT HANDLE (MISCELLANEOUS) ×10 IMPLANT
DERMABOND ADVANCED (GAUZE/BANDAGES/DRESSINGS)
DERMABOND ADVANCED .7 DNX12 (GAUZE/BANDAGES/DRESSINGS) IMPLANT
DRAIN CHANNEL 28F RND 3/8 FF (WOUND CARE) IMPLANT
DRAIN CHANNEL 32F RND 10.7 FF (WOUND CARE) IMPLANT
DRAPE LAPAROSCOPIC ABDOMINAL (DRAPES) ×10 IMPLANT
DRAPE WARM FLUID 44X44 (DRAPE) ×10 IMPLANT
ELECT REM PT RETURN 9FT ADLT (ELECTROSURGICAL) ×10
ELECTRODE REM PT RTRN 9FT ADLT (ELECTROSURGICAL) ×6 IMPLANT
GAUZE SPONGE 4X4 12PLY STRL (GAUZE/BANDAGES/DRESSINGS) ×10 IMPLANT
GLOVE SURG SIGNA 7.5 PF LTX (GLOVE) ×20 IMPLANT
GOWN STRL REUS W/ TWL LRG LVL3 (GOWN DISPOSABLE) ×12 IMPLANT
GOWN STRL REUS W/ TWL XL LVL3 (GOWN DISPOSABLE) ×6 IMPLANT
GOWN STRL REUS W/TWL LRG LVL3 (GOWN DISPOSABLE) ×20
GOWN STRL REUS W/TWL XL LVL3 (GOWN DISPOSABLE) ×10
HEMOSTAT SURGICEL 2X14 (HEMOSTASIS) IMPLANT
IV CATH 22GX1 FEP (IV SOLUTION) IMPLANT
KIT BASIN OR (CUSTOM PROCEDURE TRAY) ×10 IMPLANT
KIT ROOM TURNOVER OR (KITS) ×10 IMPLANT
KIT SUCTION CATH 14FR (SUCTIONS) ×10 IMPLANT
NS IRRIG 1000ML POUR BTL (IV SOLUTION) ×20 IMPLANT
PACK CHEST (CUSTOM PROCEDURE TRAY) ×10 IMPLANT
PAD ARMBOARD 7.5X6 YLW CONV (MISCELLANEOUS) ×20 IMPLANT
POUCH ENDO CATCH II 15MM (MISCELLANEOUS) IMPLANT
POUCH SPECIMEN RETRIEVAL 10MM (ENDOMECHANICALS) IMPLANT
SEALANT PROGEL (MISCELLANEOUS) IMPLANT
SEALANT SURG COSEAL 4ML (VASCULAR PRODUCTS) IMPLANT
SEALANT SURG COSEAL 8ML (VASCULAR PRODUCTS) IMPLANT
SOLUTION ANTI FOG 6CC (MISCELLANEOUS) ×10 IMPLANT
SPECIMEN JAR MEDIUM (MISCELLANEOUS) ×10 IMPLANT
SPONGE INTESTINAL PEANUT (DISPOSABLE) IMPLANT
SPONGE TONSIL 1 RF SGL (DISPOSABLE) ×10 IMPLANT
SUT PROLENE 4 0 RB 1 (SUTURE) ×5
SUT PROLENE 4-0 RB1 .5 CRCL 36 (SUTURE) ×1 IMPLANT
SUT SILK  1 MH (SUTURE) ×8
SUT SILK 1 MH (SUTURE) ×12 IMPLANT
SUT SILK 2 0SH CR/8 30 (SUTURE) IMPLANT
SUT SILK 3 0SH CR/8 30 (SUTURE) IMPLANT
SUT VIC AB 1 CTX 36 (SUTURE)
SUT VIC AB 1 CTX36XBRD ANBCTR (SUTURE) IMPLANT
SUT VIC AB 2-0 CTX 36 (SUTURE) IMPLANT
SUT VIC AB 2-0 UR6 27 (SUTURE) IMPLANT
SUT VIC AB 3-0 MH 27 (SUTURE) IMPLANT
SUT VIC AB 3-0 X1 27 (SUTURE) ×10 IMPLANT
SUT VICRYL 2 TP 1 (SUTURE) IMPLANT
SYR 20CC LL (SYRINGE) IMPLANT
SYSTEM SAHARA CHEST DRAIN ATS (WOUND CARE) ×10 IMPLANT
TAPE CLOTH 4X10 WHT NS (GAUZE/BANDAGES/DRESSINGS) ×10 IMPLANT
TIP APPLICATOR SPRAY EXTEND 16 (VASCULAR PRODUCTS) IMPLANT
TOWEL GREEN STERILE (TOWEL DISPOSABLE) IMPLANT
TOWEL GREEN STERILE FF (TOWEL DISPOSABLE) IMPLANT
TRAY FOLEY W/METER SILVER 16FR (SET/KITS/TRAYS/PACK) ×10 IMPLANT
TROCAR XCEL BLADELESS 5X75MML (TROCAR) ×10 IMPLANT
TROCAR XCEL NON-BLD 5MMX100MML (ENDOMECHANICALS) IMPLANT
TUBE TRACH SHILEY 6 76FEN UNCF (TUBING) ×1 IMPLANT
TUBE TRACH SHILEY 6 FEN UNCF (TUBING) ×5
TUNNELER SHEATH ON-Q 11GX8 DSP (PAIN MANAGEMENT) IMPLANT
WATER STERILE IRR 1000ML POUR (IV SOLUTION) ×20 IMPLANT

## 2016-10-02 NOTE — ED Triage Notes (Signed)
Pt is c/o fever, chest pain, generalized body aches and malaise. O2 sats 87% in RA, denies shortness of breath.

## 2016-10-02 NOTE — Progress Notes (Signed)
Pt continues to report 10/10 back pain with BPs in 200's. Dr Roxan Hockey notified. Heating packs applied to back.

## 2016-10-02 NOTE — Anesthesia Preprocedure Evaluation (Addendum)
Anesthesia Evaluation  Patient identified by MRN, date of birth, ID band Patient awake    Reviewed: Allergy & Precautions, NPO status , Patient's Chart, lab work & pertinent test results  Airway Mallampati: [place]  (+) Teeth Intact, Dental Advisory Given,    Pulmonary former smoker,    breath sounds clear to auscultation       Cardiovascular  Rhythm:Regular Rate:Tachycardia     Neuro/Psych    GI/Hepatic   Endo/Other    Renal/GU      Musculoskeletal   Abdominal (+) + obese,   Peds  Hematology   Anesthesia Other Findings   Reproductive/Obstetrics                            Anesthesia Physical Anesthesia Plan  ASA: IV  Anesthesia Plan: General   Post-op Pain Management:    Induction: Intravenous  PONV Risk Score and Plan: Ondansetron and Dexamethasone  Airway Management Planned: Oral ETT and Tracheostomy  Additional Equipment: Arterial line and CVP  Intra-op Plan:   Post-operative Plan: Possible Post-op intubation/ventilation  Informed Consent: I have reviewed the patients History and Physical, chart, labs and discussed the procedure including the risks, benefits and alternatives for the proposed anesthesia with the patient or authorized representative who has indicated his/her understanding and acceptance.   Dental advisory given  Plan Discussed with: CRNA and Anesthesiologist  Anesthesia Plan Comments:         Anesthesia Quick Evaluation

## 2016-10-02 NOTE — Op Note (Signed)
[name], [name]              ACCOUNT NO.:  [id number]  MEDICAL RECORD NO.:  [id number]  LOCATION:  2H23C                        FACILITY:  [facility]  PHYSICIAN:  Revonda Standard. Roxan Hockey, M.D.DATE OF BIRTH:  1955/09/25  DATE OF PROCEDURE:  10/02/2016 DATE OF DISCHARGE:                              OPERATIVE REPORT   PREOPERATIVE DIAGNOSIS:  Pneumomediastinum, suspect esophageal perforation.  POSTOPERATIVE DIAGNOSIS:  Pneumomediastinum.  PROCEDURE:  Flexible fiberoptic bronchoscopy.  SURGEON:  Revonda Standard. Roxan Hockey, M.D.  ASSISTANT:  None.  ANESTHESIA:  General.  FINDINGS:  Normal endobronchial anatomy, some edema of the mucosa and blunting of the carinas, no endobronchial lesions seen, minimal secretions.  CLINICAL NOTE:  Catherine Erickson is a 61 year old woman with a history of lye ingestion about 35 years ago with a permanent tracheostomy.  She presented with rather sudden onset of chest pain.  She came to the Emergency Room at [facility] and workup revealed a pneumomediastinum with suspicion for esophageal perforation.  The patient had been febrile on arrival and white blood cell count was elevated.  A CT with oral contrast did not show any definite extravasation.  Given the high index of suspicion, the patient was advised to go the operating room for upper endoscopy and right VATS for drainage of a small pleural effusion and pneumomediastinum.  The indications, risks, benefits and alternatives were discussed with the patient and her family.  She understood and accepted the risks and agreed to proceed.  OPERATIVE NOTE:  Catherine Erickson was brought to the operating room on October 02, 2016.  Anesthesia placed a central line and arterial blood pressure monitoring line.  She was taken into the operating room and general anesthesia was induced.  Her tracheostomy tube was changed out for a 7.5- mm endotracheal tube.  Sequential compression devices were placed on the calves for DVT  prophylaxis.  A Foley catheter was placed, she was placed in a left lateral decubitus position.  Dr. Carol Ada performed esophagogastroduodenoscopy.  Please see his full report for full details.  Of note, there was no definite esophageal perforation.  There was a stricture high in the proximal esophagus that was dilated to allow passage of the endoscope.  No endoluminal pathology was seen in the esophagus.  The endoscope was withdrawn.  Given the findings on the esophagoscopy, it was felt that there was no indication for thoracotomy to drain the mediastinum.  The patient was placed back in a supine position to ensure that the trachea was not the source of the mediastinal air.  Flexible fiberoptic bronchoscopy was performed via the endotracheal tube.  The airways appeared edematous and the carinas were blunted, but there was normal airway anatomy and no endobronchial lesions were seen, there were minimal secretions.  As the bronchoscope was pulled back in the trachea, the endotracheal tube was removed and the bronchoscope was withdrawn all the way to the tracheal stoma.  There were no adhesions or granulation tissue or signs of perforation.  The endotracheal tube was replaced and then a #6 Shiley trach was placed.  The patient was taken to the postanesthetic care unit in stable condition.     Revonda Standard Roxan Hockey, M.D.  SCH/MEDQ  D:  10/02/2016  T:  10/02/2016  Job:  [id number]

## 2016-10-02 NOTE — Progress Notes (Signed)
      [address]       [place] [address]             [phone]      Reviewed CT with PO contrast. Contrast passed freely into stomach. I still think she needs her right pleural space and mediastinum drained, as well as an EGD to evaluate for possible tear/ mass. D/w patient and 2 of her daughters. They understand the indications, risks, benefits and alternatives. She understands the risks include but are not limited to death, MI, DVT/PE, bleeding, possible need for transfusion, infection, as well other unforeseeable complications. She agrees to proceed.  Revonda Standard Roxan Hockey, MD [facility] [phone]

## 2016-10-02 NOTE — Transfer of Care (Signed)
Immediate Anesthesia Transfer of Care Note  Patient: Catherine Erickson  Procedure(s) Performed: Procedure(s): VIDEO BRONCHOSCOPY (N/A) ESOPHAGOSCOPY (Right)  Patient Location: PACU  Anesthesia Type:General  Level of Consciousness: awake and alert   Airway & Oxygen Therapy: Patient Spontanous Breathing and Patient connected to tracheostomy mask oxygen  Post-op Assessment: Report given to RN, Post -op Vital signs reviewed and stable and Patient moving all extremities X 4  Post vital signs: Reviewed and stable  Last Vitals:  Vitals:   10/02/16 0854 10/02/16 0900  BP: (!) 147/94 (!) 166/76  Pulse: (!) 120 (!) 120  Resp: (!) 26 (!) 24  Temp: 36.9 C     Last Pain:  Vitals:   10/02/16 0854  TempSrc: Oral  PainSc: 0-No pain         Complications: No apparent anesthesia complications

## 2016-10-02 NOTE — Progress Notes (Signed)
Pharmacy Antibiotic Note  Catherine Erickson is a 61 y.o. female admitted on 10/02/2016 with sepsis.  Pharmacy has been consulted for vancomycin dosing.  Received vancomycin 2g IV in ED this AM at 0448.  Also on Zosyn.  Plan: Vancomycin 1250 mg IV every 24 hours.  Goal trough 15-20 mcg/mL.  Will watch renal function, cultures and clinical course. Vancomycin trough at steady state as indicated.  Height: 5\' 3"  (160 cm) Weight: 244 lb 7.8 oz (110.9 kg) IBW/kg (Calculated) : 52.4  Temp (24hrs), Avg:99.3 F (37.4 C), Min:97.3 F (36.3 C), Max:102.9 F (39.4 C)   Recent Labs Lab 10/02/16 0105 10/02/16 0118 10/02/16 0502  WBC 25.1*  --   --   CREATININE 1.52*  --   --   LATICACIDVEN  --  1.93* 0.83    Estimated Creatinine Clearance: 48.3 mL/min (A) (by C-G formula based on SCr of 1.52 mg/dL (H)).    No Known Allergies  Antimicrobials this admission: 6/16 zosyn >>  6/16 vancomycin >>   Dose adjustments this admission: * Microbiology results: 6/16 BCx x 2:  6/16 UCx: Sputum:  6/16 MRSA PCR: neg  Thank you for allowing pharmacy to be a part of this patient's care.  Uvaldo Rising, BCPS  Clinical Pharmacist Pager [phone]  10/02/2016 2:46 PM

## 2016-10-02 NOTE — Progress Notes (Signed)
pCxr  Read by Dr Linna Caprice - okay to use

## 2016-10-02 NOTE — Op Note (Signed)
[facility] Patient Name: Catherine Erickson Procedure Date : 10/02/2016 MRN: [id number] Attending MD: Carol Ada , MD Date of Birth: Aug 11, 1955 CSN: [id number] Age: 61 Admit Type: Inpatient Procedure:                Upper GI endoscopy Indications:              Abnormal CT of the GI tract Providers:                Carol Ada, MD, Vista Lawman, RN, William Dalton, Technician, Marcene Duos, Technician Referring MD:              Medicines:                General Anesthesia Complications:            No immediate complications. Estimated Blood Loss:     Estimated blood loss: none. Procedure:                Pre-Anesthesia Assessment:                           - Prior to the procedure, a History and Physical                            was performed, and patient medications and                            allergies were reviewed. The patient's tolerance of                            previous anesthesia was also reviewed. The risks                            and benefits of the procedure and the sedation                            options and risks were discussed with the patient.                            All questions were answered, and informed consent                            was obtained. Prior Anticoagulants: The patient has                            taken no previous anticoagulant or antiplatelet                            agents. ASA Grade Assessment: III - A patient with                            severe systemic disease. After reviewing the risks  and benefits, the patient was deemed in                            satisfactory condition to undergo the procedure.                           - Sedation was administered by an anesthesia                            professional. General anesthesia was attained.                           After obtaining informed consent, the endoscope was                            passed  under direct vision. Throughout the                            procedure, the patient's blood pressure, pulse, and                            oxygen saturations were monitored continuously. The                            was introduced through the mouth, and advanced to                            the second part of duodenum. The upper GI endoscopy                            was technically difficult and complex. The patient                            tolerated the procedure well. Scope In: Scope Out: Findings:      One severe benign-appearing, intrinsic stenosis was found. This measured       7 mm (inner diameter) x 1.5 cm (in length) and was traversed after       downsizing scope and dilating. A guide wire was placed, then the scope       was withdrawn. Using the wire as a guide, dilation with a 6-7-8 mm       balloon and an 11-25-08 mm balloon dilator was performed to 10 mm. The       dilation site was examined and showed moderate improvement in luminal       narrowing. Estimated blood loss: none.      The stomach was normal.      The examined duodenum was normal.      Approximately 2-3 cm distal to the UES a very tight stricture was       identified. The adult endoscope was not able to traverse the area with       moderate pressure. The pediatric endoscope was then used, but it was       still too large for the stricture. Using a through the scope balloon       dilator with the adult upper endoscope the stricture was  dilated up       sequentially from 6-10 mm. The pediatric endoscope was again used and       there was some resistance, but with moderate pressure the endoscope was       advanced through the stricture. There was no evidence of any esophageal       masses, ulcerations, or inflammation. No evidence of any esophageal       perforation. The Z-line was sharp and intact. Even with dilation up to       10 mm it was still difficult to push the endoscope through the entire        upper GI tract. Even withdrawing the endoscope was difficult. Impression:               - Benign-appearing esophageal stenosis. Dilated.                           - Normal stomach.                           - Normal examined duodenum.                           - No specimens collected. Moderate Sedation:      None Recommendation:           - Return patient to hospital ward for ongoing care.                           - NPO.                           - Continue present medications.                           - Repeat dilation in the near future.                           - Further management per CVTS. Procedure Code(s):        --- Professional ---                           [phone], Esophagogastroduodenoscopy, flexible,                            transoral; with insertion of guide wire followed by                            passage of dilator(s) through esophagus over guide                            wire Diagnosis Code(s):        --- Professional ---                           K22.2, Esophageal obstruction                           R93.3, Abnormal findings on diagnostic imaging of  other parts of digestive tract CPT copyright 2016 American Medical Association. All rights reserved. The codes documented in this report are preliminary and upon coder review may  be revised to meet current compliance requirements. Carol Ada, MD Carol Ada, MD 10/02/2016 11:57:31 AM This report has been signed electronically. Number of Addenda: 0

## 2016-10-02 NOTE — H&P (Signed)
Catherine Erickson is an 61 y.o. female.   Chief Complaint: chest pain HPI: 61 yo woman with a past history significant for lye ingestion at age 59 and tracheostomy. Also has a history of hypertension and hyperlipidemia. Developed sudden onset of chest pain more on left side yesterday. Also noted fever, back pain, and shortness of breath. No preceding emesis. Went to ED. Noted to be tachycardic and appeared acutely ill. CT showed an esophageal perforation. Started on vanco and zosyn and resuscitated.  Currently still c/o chest and back pain but is smiling and in no acute distress.  Denies difficulty swallowing prior to onset of  Pain.  History reviewed. No pertinent past medical history. PMH Lye ingestion age 48 Tracheostomy Hypertension Hyperlipidemia  History reviewed. No pertinent surgical history. Tracheostomy  History reviewed. No pertinent family history. Social History:  reports that she has never smoked. She does not have any smokeless tobacco history on file. She reports that she does not drink alcohol or use drugs.  Allergies: No Known Allergies   (Not in a hospital admission)  Results for orders placed or performed during the hospital encounter of 10/02/16 (from the past 48 hour(s))  Comprehensive metabolic panel     Status: Abnormal   Collection Time: 10/02/16  1:05 AM  Result Value Ref Range   Sodium 139 135 - 145 mmol/L   Potassium 5.1 3.5 - 5.1 mmol/L   Chloride 105 101 - 111 mmol/L   CO2 23 22 - 32 mmol/L   Glucose, Bld 123 (H) 65 - 99 mg/dL   BUN 15 6 - 20 mg/dL   Creatinine, Ser 1.52 (H) 0.44 - 1.00 mg/dL   Calcium 9.2 8.9 - 10.3 mg/dL   Total Protein 9.0 (H) 6.5 - 8.1 g/dL   Albumin 3.8 3.5 - 5.0 g/dL   AST 39 15 - 41 U/L   ALT 15 14 - 54 U/L   Alkaline Phosphatase 70 38 - 126 U/L   Total Bilirubin 2.4 (H) 0.3 - 1.2 mg/dL   GFR calc non Af Amer 37 (L) >60 mL/min   GFR calc Af Amer 43 (L) >60 mL/min    Comment: (NOTE) The eGFR has been calculated using  the CKD EPI equation. This calculation has not been validated in all clinical situations. eGFR's persistently <60 mL/min signify possible Chronic Kidney Disease.    Anion gap 11 5 - 15  CBC WITH DIFFERENTIAL     Status: Abnormal   Collection Time: 10/02/16  1:05 AM  Result Value Ref Range   WBC 25.1 (H) 4.0 - 10.5 K/uL   RBC 4.83 3.87 - 5.11 MIL/uL   Hemoglobin 13.3 12.0 - 15.0 g/dL   HCT 38.2 36.0 - 46.0 %   MCV 79.1 78.0 - 100.0 fL   MCH 27.5 26.0 - 34.0 pg   MCHC 34.8 30.0 - 36.0 g/dL   RDW 15.4 11.5 - 15.5 %   Platelets 360 150 - 400 K/uL   Neutrophils Relative % 84 %   Lymphocytes Relative 8 %   Monocytes Relative 8 %   Eosinophils Relative 0 %   Basophils Relative 0 %   Neutro Abs 21.1 (H) 1.7 - 7.7 K/uL   Lymphs Abs 2.0 0.7 - 4.0 K/uL   Monocytes Absolute 2.0 (H) 0.1 - 1.0 K/uL   Eosinophils Absolute 0.0 0.0 - 0.7 K/uL   Basophils Absolute 0.0 0.0 - 0.1 K/uL   Smear Review MORPHOLOGY UNREMARKABLE   I-Stat CG4 Lactic Acid, ED  (not  at  [facility])     Status: Abnormal   Collection Time: 10/02/16  1:18 AM  Result Value Ref Range   Lactic Acid, Venous 1.93 (HH) 0.5 - 1.9 mmol/L  Urinalysis, Routine w reflex microscopic (not at [facility])     Status: Abnormal   Collection Time: 10/02/16  4:50 AM  Result Value Ref Range   Color, Urine YELLOW YELLOW   APPearance HAZY (A) CLEAR   Specific Gravity, Urine 1.020 1.005 - 1.030   pH 5.0 5.0 - 8.0   Glucose, UA NEGATIVE NEGATIVE mg/dL   Hgb urine dipstick SMALL (A) NEGATIVE   Bilirubin Urine NEGATIVE NEGATIVE   Ketones, ur 20 (A) NEGATIVE mg/dL   Protein, ur 100 (A) NEGATIVE mg/dL   Nitrite NEGATIVE NEGATIVE   Leukocytes, UA NEGATIVE NEGATIVE   RBC / HPF 0-5 0 - 5 RBC/hpf   WBC, UA 0-5 0 - 5 WBC/hpf   Bacteria, UA NONE SEEN NONE SEEN   Squamous Epithelial / LPF 6-30 (A) NONE SEEN   Mucous PRESENT    Hyaline Casts, UA PRESENT   I-Stat CG4 Lactic Acid, ED  (not at  [facility])     Status: None   Collection Time: 10/02/16  5:02 AM   Result Value Ref Range   Lactic Acid, Venous 0.83 0.5 - 1.9 mmol/L   Dg Chest 1 View  Result Date: 10/02/2016 CLINICAL DATA:  Left-sided chest pain EXAM: CHEST 1 VIEW COMPARISON:  02/18/2009 FINDINGS: Tracheostomy tube in place with tip at the lower margin of clavicles. Low lung volumes. Small bilateral effusions and mild bibasilar atelectasis or infiltrate. Mild cardiomegaly. IMPRESSION: Low lung volumes with small pleural effusions and bibasilar atelectasis or infiltrate. Cardiomegaly. Electronically Signed   By: Donavan Foil M.D.   On: 10/02/2016 02:03   Ct Angio Chest Pe W Or Wo Contrast  Result Date: 10/02/2016 CLINICAL DATA:  Acute onset of fever and left-sided chest pain. Generalized body aches and malaise. Decreased O2 saturation. Initial encounter. EXAM: CT ANGIOGRAPHY CHEST WITH CONTRAST TECHNIQUE: Multidetector CT imaging of the chest was performed using the standard protocol during bolus administration of intravenous contrast. Multiplanar CT image reconstructions and MIPs were obtained to evaluate the vascular anatomy. CONTRAST:  80 mL of Isovue 370 IV contrast COMPARISON:  Chest radiograph performed earlier today at 1:44 a.m. FINDINGS: Cardiovascular: The heart is normal in size. The thoracic aorta is grossly unremarkable. The great vessels are within normal limits. No calcific atherosclerotic disease is seen. Mediastinum/Nodes: Significant mediastinal air is seen tracking about the entire course of the esophagus, somewhat more diffuse in appearance at the upper esophagus. This is compatible with esophageal perforation. The location of perforation is difficult to determine, but may be at the upper esophagus, given the pattern of air, or at the site of esophageal wall thickening at the mid esophagus. Focal wall thickening at the mid esophagus raises concern for malignancy, though esophagitis might have a similar appearance. This measures up to 5.3 x 5.0 x 2.8 cm. Vague soft tissue  inflammation is suggested about the mainstem bronchi bilaterally, which could conceivably reflect infiltration from an underlying esophageal mass. The patient's tracheostomy tube is seen ending 4 cm above the carina. The visualized portions of the thyroid gland are unremarkable. No axillary lymphadenopathy is seen. Lungs/Pleura: Small right and trace left pleural effusions are noted. Right perihilar airspace opacity may reflect pneumonia. A 6 mm nodule is noted at the right middle lobe (image 44 of 88). No pneumothorax is seen. Upper Abdomen: The  visualized portions of the liver and spleen are grossly unremarkable. The visualized portions of the pancreas and adrenal glands are within normal limits. Left renal scarring and a left renal cyst are noted. Musculoskeletal: The bladder is mildly distended and grossly unremarkable. The prostate remains normal in size. Review of the MIP images confirms the above findings. IMPRESSION: 1. Significant mediastinal air along the entire course of the esophagus, somewhat more diffuse in appearance at the upper esophagus. This is compatible with esophageal perforation. The location of perforation is difficult to determine, but may be at the upper esophagus, given the pattern of air, or at the site of esophageal wall thickening at the mid esophagus. 2. Focal esophageal wall thickening at the mid esophagus raises concern for malignancy, though esophagitis might have a similar appearance. This measures up to 5.3 x 5.0 x 2.8 cm. Vague soft tissue inflammation suggested about the mainstem bronchi bilaterally. This is of uncertain significance and may reflect infiltration from an underlying esophageal mass. 3. Small right and trace left pleural effusions. Right perihilar airspace opacity may reflect pneumonia. 4. **An incidental finding of potential clinical significance has been found. 6 mm nodule noted at the right middle lobe. If the patient has an esophageal mass, this could reflect  metastatic disease. Alternatively, non-contrast chest CT at 6-12 months is recommended. If the nodule is stable at time of repeat CT, then future CT at 18-24 months (from today's scan) is considered optional for low-risk patients, but is recommended for high-risk patients. This recommendation follows the consensus statement: Guidelines for Management of Incidental Pulmonary Nodules Detected on CT Images: From the Fleischner Society 2017; Radiology 2017; 284:228-243.** 5. Left renal scarring and left renal cyst noted. Critical Value/emergent results were called by telephone at the time of interpretation on 10/02/2016 at 3:46 am to Dr. Deno Etienne, who verbally acknowledged these results. Electronically Signed   By: Garald Balding M.D.   On: 10/02/2016 04:07    Review of Systems  Constitutional: Positive for fever.  Respiratory: Positive for cough and shortness of breath.   Cardiovascular: Positive for chest pain.  Gastrointestinal: Negative for abdominal pain, nausea and vomiting.  Musculoskeletal: Positive for back pain.  All other systems reviewed and are negative.   Blood pressure 132/72, pulse (!) 116, temperature (!) 102.9 F (39.4 C), temperature source Oral, resp. rate (!) 25, height _0  (1.676 m), weight 260 lb (117.9 kg), SpO2 97 %. Physical Exam  Vitals reviewed. Constitutional: She is oriented to person, place, and time. No distress.  obese  HENT:  Head: Normocephalic and atraumatic.  Eyes: Conjunctivae and EOM are normal. No scleral icterus.  Neck:  Tracheostomy in place  Cardiovascular:  No murmur heard. Tachy, regular  Respiratory: Effort normal. No respiratory distress. She has no wheezes.  Diminished BS both bases  GI: Soft. She exhibits no distension. There is no tenderness.  Musculoskeletal: She exhibits no edema.  Neurological: She is alert and oriented to person, place, and time. No cranial nerve deficit.  No focal motor deficit  Skin: Skin is warm and dry.      Assessment/Plan  61 yo woman with a history of lye ingestion 35 years ago with probable esophageal injury at that time. Long standing tracheostomy. Now presents with sudden onset CP and has an esophageal perforation. This is a life-threatening illness with high probability of major morbidity. Initial resuscitation underway and has been started on broad spectrum antibiotics. Looks surprisingly good considering the diagnosis.   Admit to ICU at  [facility]  Will likely need operative intervention  Will get input from GI as to whether stenting may be possible  Continue vanco and zosyn  Melrose Nakayama, MD 10/02/2016, 6:26 AM

## 2016-10-02 NOTE — Interval H&P Note (Signed)
History and Physical Interval Note:  10/02/2016 10:10 AM  Catherine Erickson  has presented today for surgery, with the diagnosis of ESOPHAGEAL PERFERATION  The various methods of treatment have been discussed with the patient and family. After consideration of risks, benefits and other options for treatment, the patient has consented to  Procedure(s): VIDEO ASSISTED THORACOSCOPY WITH ESOPHASCOPY (Right) VIDEO ASSISTED THORACOSCOPY, DRAIN MEDIASTINUM WITH ESOPHAGOSCOPY (Right) ESOPHAGOSCOPY (Right) as a surgical intervention .  The patient's history has been reviewed, patient examined, no change in status, stable for surgery.  I have reviewed the patient's chart and labs.  Questions were answered to the patient's satisfaction.     Melrose Nakayama

## 2016-10-02 NOTE — Consult Note (Signed)
Reason for Consult: Esophageal perforation and possible mass Referring Physician: CVTS  Catherine Erickson HPI: This is a 61 year old female who presents to the ER with complaints of chest pain, fever, and SOB.  The majority of the history was obtained from the chart as she was in the process of being transferred to [facility].  She started with L>R chest pain acutely yesterday and her symptoms continued to progress.  She presented to the ER wit hthe above complaints and her WBC was found to be at 25,000 and she was SOB.  She is s/p tracheostomy and at the age of 25 she ingested lye.  A CT scan of the chest revealed an esophageal perforation, but the site of perforation was not clear.  There is a significant amount of air in the upper esophagus, but there is also the possibility of a mid esophageal mass.  Vancomycin and Zosyn were initiated.  No reports of dysphagia, odynophagia, nausea, or vomiting.  History reviewed. No pertinent past medical history.  History reviewed. No pertinent surgical history.  History reviewed. No pertinent family history.  Social History:  reports that she has never smoked. She does not have any smokeless tobacco history on file. She reports that she does not drink alcohol or use drugs.  Allergies: No Known Allergies  Medications:  Scheduled: . iopamidol      . iopamidol       Continuous:   Results for orders placed or performed during the hospital encounter of 10/02/16 (from the past 24 hour(s))  Comprehensive metabolic panel     Status: Abnormal   Collection Time: 10/02/16  1:05 AM  Result Value Ref Range   Sodium 139 135 - 145 mmol/L   Potassium 5.1 3.5 - 5.1 mmol/L   Chloride 105 101 - 111 mmol/L   CO2 23 22 - 32 mmol/L   Glucose, Bld 123 (H) 65 - 99 mg/dL   BUN 15 6 - 20 mg/dL   Creatinine, Ser 1.52 (H) 0.44 - 1.00 mg/dL   Calcium 9.2 8.9 - 10.3 mg/dL   Total Protein 9.0 (H) 6.5 - 8.1 g/dL   Albumin 3.8 3.5 - 5.0 g/dL   AST 39 15 - 41 U/L    ALT 15 14 - 54 U/L   Alkaline Phosphatase 70 38 - 126 U/L   Total Bilirubin 2.4 (H) 0.3 - 1.2 mg/dL   GFR calc non Af Amer 37 (L) >60 mL/min   GFR calc Af Amer 43 (L) >60 mL/min   Anion gap 11 5 - 15  CBC WITH DIFFERENTIAL     Status: Abnormal   Collection Time: 10/02/16  1:05 AM  Result Value Ref Range   WBC 25.1 (H) 4.0 - 10.5 K/uL   RBC 4.83 3.87 - 5.11 MIL/uL   Hemoglobin 13.3 12.0 - 15.0 g/dL   HCT 38.2 36.0 - 46.0 %   MCV 79.1 78.0 - 100.0 fL   MCH 27.5 26.0 - 34.0 pg   MCHC 34.8 30.0 - 36.0 g/dL   RDW 15.4 11.5 - 15.5 %   Platelets 360 150 - 400 K/uL   Neutrophils Relative % 84 %   Lymphocytes Relative 8 %   Monocytes Relative 8 %   Eosinophils Relative 0 %   Basophils Relative 0 %   Neutro Abs 21.1 (H) 1.7 - 7.7 K/uL   Lymphs Abs 2.0 0.7 - 4.0 K/uL   Monocytes Absolute 2.0 (H) 0.1 - 1.0 K/uL   Eosinophils Absolute  0.0 0.0 - 0.7 K/uL   Basophils Absolute 0.0 0.0 - 0.1 K/uL   Smear Review MORPHOLOGY UNREMARKABLE   I-Stat CG4 Lactic Acid, ED  (not at  [facility])     Status: Abnormal   Collection Time: 10/02/16  1:18 AM  Result Value Ref Range   Lactic Acid, Venous 1.93 (HH) 0.5 - 1.9 mmol/L  Urinalysis, Routine w reflex microscopic (not at [facility])     Status: Abnormal   Collection Time: 10/02/16  4:50 AM  Result Value Ref Range   Color, Urine YELLOW YELLOW   APPearance HAZY (A) CLEAR   Specific Gravity, Urine 1.020 1.005 - 1.030   pH 5.0 5.0 - 8.0   Glucose, UA NEGATIVE NEGATIVE mg/dL   Hgb urine dipstick SMALL (A) NEGATIVE   Bilirubin Urine NEGATIVE NEGATIVE   Ketones, ur 20 (A) NEGATIVE mg/dL   Protein, ur 100 (A) NEGATIVE mg/dL   Nitrite NEGATIVE NEGATIVE   Leukocytes, UA NEGATIVE NEGATIVE   RBC / HPF 0-5 0 - 5 RBC/hpf   WBC, UA 0-5 0 - 5 WBC/hpf   Bacteria, UA NONE SEEN NONE SEEN   Squamous Epithelial / LPF 6-30 (A) NONE SEEN   Mucous PRESENT    Hyaline Casts, UA PRESENT   I-Stat CG4 Lactic Acid, ED  (not at  [facility])     Status: None   Collection Time:  10/02/16  5:02 AM  Result Value Ref Range   Lactic Acid, Venous 0.83 0.5 - 1.9 mmol/L     Dg Chest 1 View  Result Date: 10/02/2016 CLINICAL DATA:  Left-sided chest pain EXAM: CHEST 1 VIEW COMPARISON:  02/18/2009 FINDINGS: Tracheostomy tube in place with tip at the lower margin of clavicles. Low lung volumes. Small bilateral effusions and mild bibasilar atelectasis or infiltrate. Mild cardiomegaly. IMPRESSION: Low lung volumes with small pleural effusions and bibasilar atelectasis or infiltrate. Cardiomegaly. Electronically Signed   By: Donavan Foil M.D.   On: 10/02/2016 02:03   Ct Angio Chest Pe W Or Wo Contrast  Result Date: 10/02/2016 CLINICAL DATA:  Acute onset of fever and left-sided chest pain. Generalized body aches and malaise. Decreased O2 saturation. Initial encounter. EXAM: CT ANGIOGRAPHY CHEST WITH CONTRAST TECHNIQUE: Multidetector CT imaging of the chest was performed using the standard protocol during bolus administration of intravenous contrast. Multiplanar CT image reconstructions and MIPs were obtained to evaluate the vascular anatomy. CONTRAST:  80 mL of Isovue 370 IV contrast COMPARISON:  Chest radiograph performed earlier today at 1:44 a.m. FINDINGS: Cardiovascular: The heart is normal in size. The thoracic aorta is grossly unremarkable. The great vessels are within normal limits. No calcific atherosclerotic disease is seen. Mediastinum/Nodes: Significant mediastinal air is seen tracking about the entire course of the esophagus, somewhat more diffuse in appearance at the upper esophagus. This is compatible with esophageal perforation. The location of perforation is difficult to determine, but may be at the upper esophagus, given the pattern of air, or at the site of esophageal wall thickening at the mid esophagus. Focal wall thickening at the mid esophagus raises concern for malignancy, though esophagitis might have a similar appearance. This measures up to 5.3 x 5.0 x 2.8 cm. Vague  soft tissue inflammation is suggested about the mainstem bronchi bilaterally, which could conceivably reflect infiltration from an underlying esophageal mass. The patient's tracheostomy tube is seen ending 4 cm above the carina. The visualized portions of the thyroid gland are unremarkable. No axillary lymphadenopathy is seen. Lungs/Pleura: Small right and trace left pleural effusions  are noted. Right perihilar airspace opacity may reflect pneumonia. A 6 mm nodule is noted at the right middle lobe (image 44 of 88). No pneumothorax is seen. Upper Abdomen: The visualized portions of the liver and spleen are grossly unremarkable. The visualized portions of the pancreas and adrenal glands are within normal limits. Left renal scarring and a left renal cyst are noted. Musculoskeletal: The bladder is mildly distended and grossly unremarkable. The prostate remains normal in size. Review of the MIP images confirms the above findings. IMPRESSION: 1. Significant mediastinal air along the entire course of the esophagus, somewhat more diffuse in appearance at the upper esophagus. This is compatible with esophageal perforation. The location of perforation is difficult to determine, but may be at the upper esophagus, given the pattern of air, or at the site of esophageal wall thickening at the mid esophagus. 2. Focal esophageal wall thickening at the mid esophagus raises concern for malignancy, though esophagitis might have a similar appearance. This measures up to 5.3 x 5.0 x 2.8 cm. Vague soft tissue inflammation suggested about the mainstem bronchi bilaterally. This is of uncertain significance and may reflect infiltration from an underlying esophageal mass. 3. Small right and trace left pleural effusions. Right perihilar airspace opacity may reflect pneumonia. 4. **An incidental finding of potential clinical significance has been found. 6 mm nodule noted at the right middle lobe. If the patient has an esophageal mass, this  could reflect metastatic disease. Alternatively, non-contrast chest CT at 6-12 months is recommended. If the nodule is stable at time of repeat CT, then future CT at 18-24 months (from today's scan) is considered optional for low-risk patients, but is recommended for high-risk patients. This recommendation follows the consensus statement: Guidelines for Management of Incidental Pulmonary Nodules Detected on CT Images: From the Fleischner Society 2017; Radiology 2017; 284:228-243.** 5. Left renal scarring and left renal cyst noted. Critical Value/emergent results were called by telephone at the time of interpretation on 10/02/2016 at 3:46 am to Dr. Deno Etienne, who verbally acknowledged these results. Electronically Signed   By: Garald Balding M.D.   On: 10/02/2016 04:07    ROS:  As stated above in the HPI otherwise negative.  Blood pressure (!) 161/103, pulse (!) 119, temperature 98.3 F (36.8 C), temperature source Oral, resp. rate 19, height 5\' 6"  (1.676 m), weight 117.9 kg (260 lb), SpO2 97 %.    PE: Gen: NAD, Alert and Oriented HEENT:  [place]/AT, EOMI Neck: Supple, no LAD Lungs: CTA Bilaterally CV: RRR without M/G/R, tachycardic ABM: Soft, NTND, +BS Ext: No C/C/E  Assessment/Plan: 1) Esophageal perforation. 2) ? Esophageal mass. 3) Tracheostomy. 4) Leukocytosis. 5) Fever.   Clinically she appears better than her CT scan reports.  I spoke with Dr. Roxan Hockey and the plan is to pursue an EGD to further define the possibility of an esophageal mass and perforation.  A repeat CT scan was performed with contrast to help isolate the site of perforation.  With her prior history of lye ingestion she is at a higher risk of esophageal malignancy.  Plan: 1) Emergent EGD in the OR when patient is ready.  Catherine Erickson D 10/02/2016, 8:05 AM

## 2016-10-02 NOTE — Progress Notes (Signed)
Pt transfer from [facility]. Currently denies pain. #6 [place] in place. TC on at 6Lpm. Pt has copious secretions, suctioning performed. Pt reports secretions normal. Family at bedside. Currently denies pain. Dr Roxan Hockey in to discuss plan of care with patient.

## 2016-10-02 NOTE — Progress Notes (Signed)
Pt complaining of 10/10 back pain. Arterial BPs in 200's. 2mg  Morphine given as ordered, pt denied relief, BP remained elevated. Ellwood Handler, PA paged. Additional 2mg  Morphine given as per verbal order.

## 2016-10-02 NOTE — Brief Op Note (Signed)
10/02/2016  4:29 PM  PATIENT:  Catherine Erickson  61 y.o. female  PRE-OPERATIVE DIAGNOSIS:  ESOPHAGEAL PERFERATION  POST-OPERATIVE DIAGNOSIS:  PNEUMOMEDIASTINUM  PROCEDURE:  Procedure(s): VIDEO BRONCHOSCOPY (N/A) ESOPHAGOSCOPY (Right)  SURGEON:  Surgeon(s) and Role: Panel 1:    * Melrose Nakayama, MD - Primary  Panel 2:    Carol Ada, MD - Primary  ANESTHESIA:   general  EBL:  Total I/O In: 1933.3 [I.V.:1533.3; Other:150; IV Piggyback:250] Out: 1010 [Urine:1000; Blood:10]  BLOOD ADMINISTERED:none  DRAINS: none   LOCAL MEDICATIONS USED:  NONE  SPECIMEN:  No Specimen  DISPOSITION OF SPECIMEN:  N/A  PLAN OF CARE: Admit to inpatient   PATIENT DISPOSITION:  PACU - hemodynamically stable.   Delay start of Pharmacological VTE agent (>24hrs) due to surgical blood loss or risk of bleeding: yes

## 2016-10-02 NOTE — Anesthesia Postprocedure Evaluation (Signed)
Anesthesia Post Note  Patient: JAQUANA GEIGER  Procedure(s) Performed: Procedure(s) (LRB): VIDEO BRONCHOSCOPY (N/A) ESOPHAGOSCOPY (Right)     Patient location during evaluation: PACU Anesthesia Type: General Level of consciousness: awake and awake and alert Pain management: pain level controlled Vital Signs Assessment: post-procedure vital signs reviewed and stable Respiratory status: spontaneous breathing, respiratory function stable and nonlabored ventilation Cardiovascular status: blood pressure returned to baseline Postop Assessment: no headache Anesthetic complications: no    Last Vitals:  Vitals:   10/02/16 1800 10/02/16 1900  BP:    Pulse: (!) 112 (!) 114  Resp: (!) 53 (!) 33  Temp:      Last Pain:  Vitals:   10/02/16 1754  TempSrc: Oral  PainSc:                  Orrie Schubert COKER

## 2016-10-02 NOTE — Progress Notes (Signed)
Trach ties placed with RN without complication.

## 2016-10-02 NOTE — ED Provider Notes (Signed)
[facility] DEPT Provider Note   CSN: [id number] Arrival date & time: 10/02/16  0028  By signing my name below, I, Ny'Kea Lewis, attest that this documentation has been prepared under the direction and in the presence of Deno Etienne, DO. Electronically Signed: Lise Auer, ED Scribe. 10/02/16. 1:30 AM.  History   Chief Complaint Chief Complaint  Patient presents with  . Fever  . Chest Pain   The history is provided by the patient and a relative. No language interpreter was used.    HPI Comments: Catherine Erickson is a 61 y.o. female with no a history of HTN and HLD, who presents to the Emergency Department complaining of sudden onset, sharp, left sided chest pain that began today. Pt notes associated fever, generalized back pain, coughing, and shortness of breath. Per pt's family they noticed she was not feeling well. Pain chest is worsened when coughing. No recent sick contact. No h/o PE/DVT. Not currently on anti coagulants. She has tried Percocet and Ibuprofen with mild relief. Denies nausea, emesis, diarrhea, or any other acute associated symptoms at this time.  History reviewed. No pertinent past medical history.  Patient Active Problem List   Diagnosis Date Noted  . Esophageal perforation 10/02/2016  . INSOMNIA 04/22/2009  . HYPERLIPIDEMIA 06/12/2007  . ALLERGIC RHINITIS 06/12/2007  . HYPERTENSION 04/19/2001   History reviewed. No pertinent surgical history.  OB History    No data available     Home Medications    Prior to Admission medications   Medication Sig Start Date End Date Taking? Authorizing Provider  amLODipine (NORVASC) 5 MG tablet Take 5 mg by mouth daily after breakfast. 08/09/16  Yes [provider]  ibuprofen (ADVIL,MOTRIN) 200 MG tablet Take 400 mg by mouth every 6 (six) hours as needed for headache, mild pain or moderate pain.   Yes [provider]  lisinopril-hydrochlorothiazide (PRINZIDE,ZESTORETIC) 20-25 MG tablet Take 1 tablet by  mouth daily after breakfast. 08/09/16  Yes [provider]   Family History History reviewed. No pertinent family history.  Social History Social History  Substance Use Topics  . Smoking status: Never Smoker  . Smokeless tobacco: Not on file  . Alcohol use No   Allergies   Patient has no known allergies.  Review of Systems Review of Systems  Constitutional: Positive for fever. Negative for chills.  HENT: Negative for congestion and rhinorrhea.   Eyes: Negative for redness and visual disturbance.  Respiratory: Positive for cough and shortness of breath. Negative for wheezing.   Cardiovascular: Positive for chest pain.  Genitourinary: Negative for dysuria and urgency.  Musculoskeletal: Positive for back pain. Negative for arthralgias and myalgias.  Skin: Negative for pallor and wound.  All other systems reviewed and are negative.  Physical Exam Updated Vital Signs BP (!) 161/103 (BP Location: Left Arm) Comment: arm flexed, holding granddaughter.  Pulse (!) 119   Temp 98.3 F (36.8 C) (Oral)   Resp 19   Ht 5\' 6"  (1.676 m)   Wt 117.9 kg (260 lb)   SpO2 97%   BMI 41.97 kg/m   Physical Exam  Constitutional: She is oriented to person, place, and time. She appears well-developed and well-nourished. No distress.  Warm to the touch.   HENT:  Head: Normocephalic and atraumatic.  Eyes: EOM are normal. Pupils are equal, round, and reactive to light.  Neck: Normal range of motion. Neck supple.  Cardiovascular: Regular rhythm.  Tachycardia present.  Exam reveals no gallop and no friction rub.  No murmur heard. Pulmonary/Chest: Effort normal. She has no wheezes. She has no rales.  Diminshed breath sounds. Rhonchi in the left lung base. Trache in place without discharge.   Abdominal: Soft. She exhibits no distension. There is no tenderness.  Musculoskeletal: She exhibits no edema or tenderness.  No lower extremities edema.   Neurological: She is alert and oriented to  person, place, and time.  Skin: Skin is warm. She is diaphoretic.  Psychiatric: She has a normal mood and affect. Her behavior is normal.  Nursing note and vitals reviewed.  ED Treatments / Results  DIAGNOSTIC STUDIES: Oxygen Saturation is 90% on RA, low by my interpretation.   COORDINATION OF CARE: 1:07 AM-Discussed next steps with pt. Pt verbalized understanding and is agreeable with the plan.   Labs (all labs ordered are listed, but only abnormal results are displayed) Labs Reviewed  COMPREHENSIVE METABOLIC PANEL - Abnormal; Notable for the following:       Result Value   Glucose, Bld 123 (*)    Creatinine, Ser 1.52 (*)    Total Protein 9.0 (*)    Total Bilirubin 2.4 (*)    GFR calc non Af Amer 37 (*)    GFR calc Af Amer 43 (*)    All other components within normal limits  CBC WITH DIFFERENTIAL/PLATELET - Abnormal; Notable for the following:    WBC 25.1 (*)    Neutro Abs 21.1 (*)    Monocytes Absolute 2.0 (*)    All other components within normal limits  URINALYSIS, ROUTINE W REFLEX MICROSCOPIC - Abnormal; Notable for the following:    APPearance HAZY (*)    Hgb urine dipstick SMALL (*)    Ketones, ur 20 (*)    Protein, ur 100 (*)    Squamous Epithelial / LPF 6-30 (*)    All other components within normal limits  I-STAT CG4 LACTIC ACID, ED - Abnormal; Notable for the following:    Lactic Acid, Venous 1.93 (*)    All other components within normal limits  CULTURE, BLOOD (ROUTINE X 2)  CULTURE, BLOOD (ROUTINE X 2)  URINE CULTURE  I-STAT CG4 LACTIC ACID, ED    EKG  EKG Interpretation  Date/Time:  Saturday October 02 2016 00:43:43 EDT Ventricular Rate:  146 PR Interval:    QRS Duration: 187 QT Interval:  372 QTC Calculation: 580 R Axis:   89 Text Interpretation:  Sinus tachycardia IVCD, consider atypical RBBB Since last tracing rate faster Confirmed by Deno Etienne [phone]) on 10/02/2016 1:23:37 AM       Radiology Dg Chest 1 View  Result Date:  10/02/2016 CLINICAL DATA:  Left-sided chest pain EXAM: CHEST 1 VIEW COMPARISON:  02/18/2009 FINDINGS: Tracheostomy tube in place with tip at the lower margin of clavicles. Low lung volumes. Small bilateral effusions and mild bibasilar atelectasis or infiltrate. Mild cardiomegaly. IMPRESSION: Low lung volumes with small pleural effusions and bibasilar atelectasis or infiltrate. Cardiomegaly. Electronically Signed   By: Donavan Foil M.D.   On: 10/02/2016 02:03   Ct Angio Chest Pe W Or Wo Contrast  Result Date: 10/02/2016 CLINICAL DATA:  Acute onset of fever and left-sided chest pain. Generalized body aches and malaise. Decreased O2 saturation. Initial encounter. EXAM: CT ANGIOGRAPHY CHEST WITH CONTRAST TECHNIQUE: Multidetector CT imaging of the chest was performed using the standard protocol during bolus administration of intravenous contrast. Multiplanar CT image reconstructions and MIPs were obtained to evaluate the vascular anatomy. CONTRAST:  80 mL of Isovue 370 IV contrast COMPARISON:  Chest radiograph performed earlier today at 1:44 a.m. FINDINGS: Cardiovascular: The heart is normal in size. The thoracic aorta is grossly unremarkable. The great vessels are within normal limits. No calcific atherosclerotic disease is seen. Mediastinum/Nodes: Significant mediastinal air is seen tracking about the entire course of the esophagus, somewhat more diffuse in appearance at the upper esophagus. This is compatible with esophageal perforation. The location of perforation is difficult to determine, but may be at the upper esophagus, given the pattern of air, or at the site of esophageal wall thickening at the mid esophagus. Focal wall thickening at the mid esophagus raises concern for malignancy, though esophagitis might have a similar appearance. This measures up to 5.3 x 5.0 x 2.8 cm. Vague soft tissue inflammation is suggested about the mainstem bronchi bilaterally, which could conceivably reflect infiltration from  an underlying esophageal mass. The patient's tracheostomy tube is seen ending 4 cm above the carina. The visualized portions of the thyroid gland are unremarkable. No axillary lymphadenopathy is seen. Lungs/Pleura: Small right and trace left pleural effusions are noted. Right perihilar airspace opacity may reflect pneumonia. A 6 mm nodule is noted at the right middle lobe (image 44 of 88). No pneumothorax is seen. Upper Abdomen: The visualized portions of the liver and spleen are grossly unremarkable. The visualized portions of the pancreas and adrenal glands are within normal limits. Left renal scarring and a left renal cyst are noted. Musculoskeletal: The bladder is mildly distended and grossly unremarkable. The prostate remains normal in size. Review of the MIP images confirms the above findings. IMPRESSION: 1. Significant mediastinal air along the entire course of the esophagus, somewhat more diffuse in appearance at the upper esophagus. This is compatible with esophageal perforation. The location of perforation is difficult to determine, but may be at the upper esophagus, given the pattern of air, or at the site of esophageal wall thickening at the mid esophagus. 2. Focal esophageal wall thickening at the mid esophagus raises concern for malignancy, though esophagitis might have a similar appearance. This measures up to 5.3 x 5.0 x 2.8 cm. Vague soft tissue inflammation suggested about the mainstem bronchi bilaterally. This is of uncertain significance and may reflect infiltration from an underlying esophageal mass. 3. Small right and trace left pleural effusions. Right perihilar airspace opacity may reflect pneumonia. 4. **An incidental finding of potential clinical significance has been found. 6 mm nodule noted at the right middle lobe. If the patient has an esophageal mass, this could reflect metastatic disease. Alternatively, non-contrast chest CT at 6-12 months is recommended. If the nodule is stable at  time of repeat CT, then future CT at 18-24 months (from today's scan) is considered optional for low-risk patients, but is recommended for high-risk patients. This recommendation follows the consensus statement: Guidelines for Management of Incidental Pulmonary Nodules Detected on CT Images: From the Fleischner Society 2017; Radiology 2017; 284:228-243.** 5. Left renal scarring and left renal cyst noted. Critical Value/emergent results were called by telephone at the time of interpretation on 10/02/2016 at 3:46 am to Dr. Deno Etienne, who verbally acknowledged these results. Electronically Signed   By: Garald Balding M.D.   On: 10/02/2016 04:07    Procedures Procedures (including critical care time)  Medications Ordered in ED Medications  iopamidol (ISOVUE-370) 76 % injection (not administered)  iopamidol (ISOVUE-300) 61 % injection (not administered)  acetaminophen (TYLENOL) tablet 1,000 mg (1,000 mg Oral Given 10/02/16 0230)  iopamidol (ISOVUE-370) 76 % injection 80 mL (80 mLs Intravenous Contrast Given 10/02/16  0320)  vancomycin (VANCOCIN) 2,000 mg in sodium chloride 0.9 % 500 mL IVPB (2,000 mg Intravenous New Bag/Given 10/02/16 0448)  piperacillin-tazobactam (ZOSYN) IVPB 3.375 g (0 g Intravenous Stopped 10/02/16 0440)  morphine 2 MG/ML injection 4 mg (4 mg Intravenous Given 10/02/16 0556)  ondansetron (ZOFRAN) injection 4 mg (4 mg Intravenous Given 10/02/16 0556)  iopamidol (ISOVUE-300) 61 % injection 30 mL (30 mLs Oral Contrast Given 10/02/16 9211)     Initial Impression / Assessment and Plan / ED Course  I have reviewed the triage vital signs and the nursing notes.  Pertinent labs & imaging results that were available during my care of the patient were reviewed by me and considered in my medical decision making (see chart for details).     61 yo F With a chief complaint of chest pain. Going on for the past day. Patient has had some significant coughing as well as having fevers. On my exam is  ill-appearing with regular tachycardia. Rhonchi heard in the right lung base. Chest x-ray with questionable infiltrate. I do not feel this was significant enough to cause her symptoms I ordered a CT of the chest. Found to have an esophageal perforation with a likely esophageal mass. Start on broad-spectrum antibiotics. CT surgery consult.  CRITICAL CARE Performed by: Cecilio Asper   Total critical care time: 80 minutes  Critical care time was exclusive of separately billable procedures and treating other patients.  Critical care was necessary to treat or prevent imminent or life-threatening deterioration.  Critical care was time spent personally by me on the following activities: development of treatment plan with patient and/or surrogate as well as nursing, discussions with consultants, evaluation of patient's response to treatment, examination of patient, obtaining history from patient or surrogate, ordering and performing treatments and interventions, ordering and review of laboratory studies, ordering and review of radiographic studies, pulse oximetry and re-evaluation of patient's condition.   Final Clinical Impressions(s) / ED Diagnoses   Final diagnoses:  Sepsis ([place])  Esophageal perforation    New Prescriptions New Prescriptions   No medications on file   I personally performed the services described in this documentation, which was scribed in my presence. The recorded information has been reviewed and is accurate.     Deno Etienne, DO 10/02/16 [phone]

## 2016-10-02 NOTE — Progress Notes (Signed)
Pt to [facility] from [facility] ED with stable long term trach. Pt placed on 5L/28% ATC. Pt in no distress, no increased WOB. Pt with #6 Shiley (cuffless) found to have no inner cannula. Pt came with new #6 shiley in box which was opened and inner cannula was replaced in pt existing trach. No complications noted. RN aware. RT will continue to monitor.

## 2016-10-02 NOTE — Anesthesia Procedure Notes (Signed)
Central Venous Catheter Insertion Performed by: Roberts Gaudy, anesthesiologist Start/End6/16/2018 10:45 PM, 10/02/2016 10:50 PM Patient location: Pre-op. Preanesthetic checklist: patient identified, IV checked, site marked, risks and benefits discussed, surgical consent, monitors and equipment checked, pre-op evaluation and timeout performed Position: Trendelenburg Hand hygiene performed , maximum sterile barriers used  and Seldinger technique used Catheter size: 8 Fr Central line was placed.Double lumen Procedure performed without using ultrasound guided technique. Attempts: 2 Following insertion, line sutured, dressing applied and Biopatch. Post procedure assessment: blood return through all ports, free fluid flow and no air  Patient tolerated the procedure well with no immediate complications.

## 2016-10-02 NOTE — Anesthesia Procedure Notes (Signed)
Procedure Name: Intubation Date/Time: 10/02/2016 10:40 AM Performed by: Suzy Bouchard Pre-anesthesia Checklist: Patient identified Patient Re-evaluated:Patient Re-evaluated prior to inductionOxygen Delivery Method: Circle system utilized Preoxygenation: Pre-oxygenation with 100% oxygen Intubation Type: IV induction Laryngoscope Size: Glidescope and 3 Laser Tube: Cuffed inflated with minimal occlusive pressure - saline Tube size: 7.5 mm Number of attempts: 3 Airway Equipment and Method: Stylet,  Video-laryngoscopy and Tracheostomy Placement Confirmation: positive ETCO2 and breath sounds checked- equal and bilateral Tube secured with: Tape Dental Injury: Teeth and Oropharynx as per pre-operative assessment  Difficulty Due To: Difficulty was anticipated Comments: Patient with smooth IVI 6.0 uncuffed shiley in place.  Pre 02 was through the trach.  Shiley removed and lubricated 6.5 ETT inserted through trach.  +BBS +ETC02.  Glidescope inserted carefully into mouth with no recognizable airway structures noted.  Dr. Linna Caprice looked as well.  Friable, bleeding tissue.  Glidescope removed.  6.5 ETT exchanged for 7.5 lubricated ETT easily passed, again with +BBS and +ETC02.  Bronchial blocker positioned under FOB by Dr. Linna Caprice.  Patient stable throughout procedure.

## 2016-10-03 ENCOUNTER — Inpatient Hospital Stay (HOSPITAL_COMMUNITY): Payer: Medicaid Other

## 2016-10-03 ENCOUNTER — Encounter (HOSPITAL_COMMUNITY): Payer: Self-pay | Admitting: Thoracic Surgery (Cardiothoracic Vascular Surgery)

## 2016-10-03 LAB — CBC
HEMATOCRIT: 32.4 % — AB (ref 36.0–46.0)
HEMOGLOBIN: 10.6 g/dL — AB (ref 12.0–15.0)
MCH: 26.7 pg (ref 26.0–34.0)
MCHC: 32.7 g/dL (ref 30.0–36.0)
MCV: 81.6 fL (ref 78.0–100.0)
Platelets: 301 10*3/uL (ref 150–400)
RBC: 3.97 MIL/uL (ref 3.87–5.11)
RDW: 15.7 % — ABNORMAL HIGH (ref 11.5–15.5)
WBC: 21.1 10*3/uL — ABNORMAL HIGH (ref 4.0–10.5)

## 2016-10-03 LAB — BASIC METABOLIC PANEL
Anion gap: 10 (ref 5–15)
BUN: 13 mg/dL (ref 6–20)
CHLORIDE: 109 mmol/L (ref 101–111)
CO2: 23 mmol/L (ref 22–32)
CREATININE: 1.34 mg/dL — AB (ref 0.44–1.00)
Calcium: 8.4 mg/dL — ABNORMAL LOW (ref 8.9–10.3)
GFR calc non Af Amer: 43 mL/min — ABNORMAL LOW (ref >60)
GFR, EST AFRICAN AMERICAN: 50 mL/min — AB (ref >60)
Glucose, Bld: 113 mg/dL — ABNORMAL HIGH (ref 65–99)
Potassium: 4 mmol/L (ref 3.5–5.1)
Sodium: 142 mmol/L (ref 135–145)

## 2016-10-03 LAB — URINE CULTURE

## 2016-10-03 MED ORDER — ENOXAPARIN SODIUM 40 MG/0.4ML ~~LOC~~ SOLN
40.0000 mg | SUBCUTANEOUS | Status: DC
  Administered 2016-10-03 – 2016-10-12 (×10): 40 mg via SUBCUTANEOUS
  Filled 2016-10-03 (×10): qty 0.4

## 2016-10-03 NOTE — Progress Notes (Signed)
1 Day Post-Op Procedure(s) (LRB): VIDEO BRONCHOSCOPY (N/A) ESOPHAGOSCOPY (Right) Subjective: Febrile to 102 yesterday afternoon C/o CP 7/10, not as bad as yesterday Wants ice  Objective: Vital signs in last 24 hours: Temp:  [97.3 F (36.3 C)-102.6 F (39.2 C)] 98.5 F (36.9 C) (06/17 0400) Pulse Rate:  [30-129] 101 (06/17 0700) Cardiac Rhythm: Sinus tachycardia (06/16 2000) Resp:  [17-53] 27 (06/17 0700) BP: (103-194)/(67-132) 194/79 (06/16 1526) SpO2:  [77 %-100 %] 96 % (06/17 0700) Arterial Line BP: (125-218)/(62-87) 145/67 (06/17 0700) FiO2 (%):  [28 %-35 %] 35 % (06/17 0320) Weight:  [244 lb 7.8 oz (110.9 kg)] 244 lb 7.8 oz (110.9 kg) (06/16 0854)  Hemodynamic parameters for last 24 hours:    Intake/Output from previous day: 06/16 0701 - 06/17 0700 In: 4233.3 [I.V.:3533.3; IV Piggyback:550] Out: 1935 [LFYBO:1751; Blood:10] Intake/Output this shift: No intake/output data recorded.  General appearance: alert, cooperative and mild distress Neurologic: intact Heart: tachy, regular Lungs: diminished breath sounds bibasilar Abdomen: normal findings: soft, non-tender  Lab Results:  Recent Labs  10/02/16 0105 10/03/16 0308  WBC 25.1* 21.1*  HGB 13.3 10.6*  HCT 38.2 32.4*  PLT 360 301   BMET:  Recent Labs  10/02/16 0105 10/03/16 0308  NA 139 142  K 5.1 4.0  CL 105 109  CO2 23 23  GLUCOSE 123* 113*  BUN 15 13  CREATININE 1.52* 1.34*  CALCIUM 9.2 8.4*    PT/INR:  Recent Labs  10/02/16 0945  LABPROT 15.8*  INR 1.25   ABG No results found for: PHART, HCO3, TCO2, ACIDBASEDEF, O2SAT CBG (last 3)  No results for input(s): GLUCAP in the last 72 hours.  Assessment/Plan: S/P Procedure(s) (LRB): VIDEO BRONCHOSCOPY (N/A) ESOPHAGOSCOPY (Right) -Pneumomediastinum- no evidence of esophageal perforation by Ct or EGD. No tracheal problems by bronch. May be a oropharyngeal source. Treating conservatively for now with NPO and IV antibiotics. Might still require  surgical drainage at some point.   Esophageal stricture treated with balloon dilation  Continue Vanco and Zosyn Continue IVF Continue NPO for now  Acute renal failure on admission- improving with IV hydration  SCD in place, will go ahead and add enoxaparin as well at this point   LOS: 1 day    Catherine Erickson 10/03/2016

## 2016-10-03 NOTE — Plan of Care (Signed)
Problem: Pain Managment: Goal: General experience of comfort will improve Outcome: Progressing Pt with no acute c/o pain only cjhronic lower back pain. Relieved by PRN analgesics.  Problem: Skin Integrity: Goal: Risk for impaired skin integrity will decrease Outcome: Progressing Pt without s/s of skin breakdown, sacral dressing applied per hospital protocol. Low air low bed in use.

## 2016-10-03 NOTE — Progress Notes (Signed)
      [address]       [place] [address]             [phone]       Asleep in chair  BP (!) 151/83   Pulse 99   Temp 99.7 F (37.6 C) (Oral)   Resp (!) 50   Ht 5\' 3"  (1.6 m)   Wt 244 lb 7.8 oz (110.9 kg)   SpO2 92%   BMI 43.31 kg/m    Intake/Output Summary (Last 24 hours) at 10/03/16 2000 Last data filed at 10/03/16 1900  Gross per 24 hour  Intake          2962.92 ml  Output              725 ml  Net          2237.92 ml    Continue IV antibiotics for pneumomediastinum  Remo Lipps C. Roxan Hockey, MD [facility] [phone]

## 2016-10-03 NOTE — Progress Notes (Signed)
Progress Note for [facility]  Subjective: Feeling better.  Objective: Vital signs in last 24 hours: Temp:  [97.3 F (36.3 C)-102.6 F (39.2 C)] 98.5 F (36.9 C) (06/17 0400) Pulse Rate:  [30-129] 101 (06/17 0700) Resp:  [17-53] 27 (06/17 0700) BP: (103-194)/(67-132) 194/79 (06/16 1526) SpO2:  [77 %-100 %] 96 % (06/17 0700) Arterial Line BP: (125-218)/(62-87) 145/67 (06/17 0700) FiO2 (%):  [28 %-35 %] 35 % (06/17 0320)    Intake/Output from previous day: 06/16 0701 - 06/17 0700 In: 4233.3 [I.V.:3533.3; IV Piggyback:550] Out: 1935 [DJMEQ:6834; Blood:10] Intake/Output this shift: No intake/output data recorded.  General appearance: alert and no distress Resp: wheezes - mild and diffuse Cardio: regular rate and rhythm and tachycardic GI: soft, non-tender; bowel sounds normal; no masses,  no organomegaly  Lab Results:  Recent Labs  10/02/16 0105 10/03/16 0308  WBC 25.1* 21.1*  HGB 13.3 10.6*  HCT 38.2 32.4*  PLT 360 301   BMET  Recent Labs  10/02/16 0105 10/03/16 0308  NA 139 142  K 5.1 4.0  CL 105 109  CO2 23 23  GLUCOSE 123* 113*  BUN 15 13  CREATININE 1.52* 1.34*  CALCIUM 9.2 8.4*   LFT  Recent Labs  10/02/16 0105  PROT 9.0*  ALBUMIN 3.8  AST 39  ALT 15  ALKPHOS 70  BILITOT 2.4*   PT/INR  Recent Labs  10/02/16 0945  LABPROT 15.8*  INR 1.25   Hepatitis Panel No results for input(s): HEPBSAG, HCVAB, HEPAIGM, HEPBIGM in the last 72 hours. C-Diff No results for input(s): CDIFFTOX in the last 72 hours. Fecal Lactopherrin No results for input(s): FECLLACTOFRN in the last 72 hours.  Studies/Results: X-ray Chest Pa Or Ap  Result Date: 10/02/2016 CLINICAL DATA:  Central line site infection EXAM: CHEST 1 VIEW COMPARISON:  Chest radiograph from earlier today. FINDINGS: Tracheostomy tube tip overlies the tracheal air column just below the thoracic inlet. Right subclavian central venous catheter terminates at the cavoatrial junction. Stable  cardiomediastinal silhouette with mild cardiomegaly. No pneumothorax. Probable small bilateral pleural effusions, stable. Vascular crowding without overt pulmonary edema. Low lung volumes. Stable mild patchy bibasilar lung opacities. IMPRESSION: 1. No pneumothorax. Right subclavian central venous catheter terminates over the cavoatrial junction . 2. Low lung volumes. Stable patchy bibasilar lung opacities, favor atelectasis . 3. Stable small bilateral pleural effusions . 4. Stable mild cardiomegaly without overt pulmonary edema. Electronically Signed   By: Ilona Sorrel M.D.   On: 10/02/2016 14:04   Dg Chest 1 View  Result Date: 10/02/2016 CLINICAL DATA:  Left-sided chest pain EXAM: CHEST 1 VIEW COMPARISON:  02/18/2009 FINDINGS: Tracheostomy tube in place with tip at the lower margin of clavicles. Low lung volumes. Small bilateral effusions and mild bibasilar atelectasis or infiltrate. Mild cardiomegaly. IMPRESSION: Low lung volumes with small pleural effusions and bibasilar atelectasis or infiltrate. Cardiomegaly. Electronically Signed   By: Donavan Foil M.D.   On: 10/02/2016 02:03   Ct Chest Wo Contrast  Result Date: 10/02/2016 CLINICAL DATA:  Evaluate for extravasation of contrast material from the esophagus. Pneumomediastinum. EXAM: CT CHEST WITHOUT CONTRAST TECHNIQUE: Multidetector CT imaging of the chest was performed following the standard protocol without IV contrast. COMPARISON:  Earlier today FINDINGS: Cardiovascular: There is mild cardiac enlargement. No pericardial effusion. No pneumopericardium. Mediastinum/Nodes: Tracheostomy tube is in place with tip above carina. The trachea appears patent and midline. Extensive posterior mediastinal gas is identified surrounding the esophagus and and descending aorta. The patient ingested large volume of enteric  contrast material without extravasation. The contrast passed through the esophagus and opacifies the stomach. There is mild wall thickening of the  distal esophagus from the level of the carina to the GE junction. Mediastinal lymph nodes are abnormal an multiplicity throughout the mediastinum. Right paratracheal node is enlarged measuring 1.2 cm, image 66 of series 2. Enlarged sub- carinal lymph node measures 1.7 cm, image 76 of series 2. There is a pre-vascular node which measures 1.2 cm, image 57 of series 2. Lungs/Pleura: Small bilateral pleural effusions. The right pleural effusion is mildly complicated measuring 21 HU. Low-attenuation left pleural effusion. There is subsegmental atelectasis identified involving both lower lobes, right greater than left. There is a pulmonary nodule within the right middle lobe measuring 6 mm, image 83 of series 5. Upper Abdomen: No acute abnormality. Musculoskeletal: No chest wall mass or suspicious bone lesions identified. IMPRESSION: 1. Extensive posterior pneumomediastinum. Presumed esophageal in origin. No extravasation of enteric contrast material identified however. 2. Wall thickening involving the distal esophagus. Cannot rule out underlying neoplasm versus esophagitis. 3. Mediastinal adenopathy including right paratracheal, sub- carinal and pre-vascular lymph node stations. 4. Bilateral pleural fluid collections. The right pleural fluid is mildly complicated measuring 21 HU. 5. Bibasilar atelectasis. 6. Pulmonary nodule in the right middle lobe measures 6 mm. Electronically Signed   By: Kerby Moors M.D.   On: 10/02/2016 08:03   Ct Angio Chest Pe W Or Wo Contrast  Result Date: 10/02/2016 CLINICAL DATA:  Acute onset of fever and left-sided chest pain. Generalized body aches and malaise. Decreased O2 saturation. Initial encounter. EXAM: CT ANGIOGRAPHY CHEST WITH CONTRAST TECHNIQUE: Multidetector CT imaging of the chest was performed using the standard protocol during bolus administration of intravenous contrast. Multiplanar CT image reconstructions and MIPs were obtained to evaluate the vascular anatomy.  CONTRAST:  80 mL of Isovue 370 IV contrast COMPARISON:  Chest radiograph performed earlier today at 1:44 a.m. FINDINGS: Cardiovascular: The heart is normal in size. The thoracic aorta is grossly unremarkable. The great vessels are within normal limits. No calcific atherosclerotic disease is seen. Mediastinum/Nodes: Significant mediastinal air is seen tracking about the entire course of the esophagus, somewhat more diffuse in appearance at the upper esophagus. This is compatible with esophageal perforation. The location of perforation is difficult to determine, but may be at the upper esophagus, given the pattern of air, or at the site of esophageal wall thickening at the mid esophagus. Focal wall thickening at the mid esophagus raises concern for malignancy, though esophagitis might have a similar appearance. This measures up to 5.3 x 5.0 x 2.8 cm. Vague soft tissue inflammation is suggested about the mainstem bronchi bilaterally, which could conceivably reflect infiltration from an underlying esophageal mass. The patient's tracheostomy tube is seen ending 4 cm above the carina. The visualized portions of the thyroid gland are unremarkable. No axillary lymphadenopathy is seen. Lungs/Pleura: Small right and trace left pleural effusions are noted. Right perihilar airspace opacity may reflect pneumonia. A 6 mm nodule is noted at the right middle lobe (image 44 of 88). No pneumothorax is seen. Upper Abdomen: The visualized portions of the liver and spleen are grossly unremarkable. The visualized portions of the pancreas and adrenal glands are within normal limits. Left renal scarring and a left renal cyst are noted. Musculoskeletal: The bladder is mildly distended and grossly unremarkable. The prostate remains normal in size. Review of the MIP images confirms the above findings. IMPRESSION: 1. Significant mediastinal air along the entire course of the esophagus,  somewhat more diffuse in appearance at the upper  esophagus. This is compatible with esophageal perforation. The location of perforation is difficult to determine, but may be at the upper esophagus, given the pattern of air, or at the site of esophageal wall thickening at the mid esophagus. 2. Focal esophageal wall thickening at the mid esophagus raises concern for malignancy, though esophagitis might have a similar appearance. This measures up to 5.3 x 5.0 x 2.8 cm. Vague soft tissue inflammation suggested about the mainstem bronchi bilaterally. This is of uncertain significance and may reflect infiltration from an underlying esophageal mass. 3. Small right and trace left pleural effusions. Right perihilar airspace opacity may reflect pneumonia. 4. **An incidental finding of potential clinical significance has been found. 6 mm nodule noted at the right middle lobe. If the patient has an esophageal mass, this could reflect metastatic disease. Alternatively, non-contrast chest CT at 6-12 months is recommended. If the nodule is stable at time of repeat CT, then future CT at 18-24 months (from today's scan) is considered optional for low-risk patients, but is recommended for high-risk patients. This recommendation follows the consensus statement: Guidelines for Management of Incidental Pulmonary Nodules Detected on CT Images: From the Fleischner Society 2017; Radiology 2017; 284:228-243.** 5. Left renal scarring and left renal cyst noted. Critical Value/emergent results were called by telephone at the time of interpretation on 10/02/2016 at 3:46 am to Dr. Deno Etienne, who verbally acknowledged these results. Electronically Signed   By: Garald Balding M.D.   On: 10/02/2016 04:07   Dg Chest Port 1 View  Result Date: 10/03/2016 CLINICAL DATA:  Pneumomediastinum. EXAM: PORTABLE CHEST 1 VIEW COMPARISON:  10/02/2016 FINDINGS: Tracheostomy tube is in place, tip well positioned in the central trachea. Right subclavian central venous line tip overlies the level of superior  vena cava. Heart is enlarged. There is opacification of the left lower lobe, obscuring the hemidiaphragm. Right pleural effusion and right basilar opacity persists and appears increased. IMPRESSION: 1. Persistent cardiomegaly. 2. Increasing opacities at the lung bases. Electronically Signed   By: Nolon Nations M.D.   On: 10/03/2016 07:57    Medications:  Scheduled: . chlorhexidine  15 mL Mouth Rinse BID  . Chlorhexidine Gluconate Cloth  6 each Topical Daily  . enoxaparin (LOVENOX) injection  40 mg Subcutaneous Q24H  . mouth rinse  15 mL Mouth Rinse q12n4p  . metoprolol tartrate  5 mg Intravenous Q6H  . pantoprazole (PROTONIX) IV  40 mg Intravenous Q24H  . [START ON 10/06/2016] pneumococcal 23 valent vaccine  0.5 mL Intramuscular Tomorrow-1000  . sodium chloride flush  10-40 mL Intracatheter Q12H   Continuous: . sodium chloride 125 mL/hr at 10/03/16 0700  . piperacillin-tazobactam (ZOSYN)  IV 3.375 g (10/03/16 0800)  . 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 2) Pneumomediastinum. 3) Fever. 4) Leukocytosis. 5) AKI.   Clinically he remains stable and she is improving.  Surprisingly, she denies ever having any issues with dysphagia.  She had a Tmax of 102 yesterday afternoon and her WBC is still elevated, but decreasing.  No overt source for pneumomediastinum.  Plan: 1) Maintain NPO for now. 2) Continue with antibiotics. 3) Further esophageal dilation in the future. 4) [facility] to assume care in the AM.  LOS: 1 day   Lashanda Storlie D 10/03/2016, 9:38 AM

## 2016-10-04 ENCOUNTER — Encounter (HOSPITAL_COMMUNITY): Payer: Self-pay | Admitting: *Deleted

## 2016-10-04 ENCOUNTER — Inpatient Hospital Stay (HOSPITAL_COMMUNITY): Payer: Medicaid Other

## 2016-10-04 DIAGNOSIS — K222 Esophageal obstruction: Principal | ICD-10-CM

## 2016-10-04 DIAGNOSIS — J982 Interstitial emphysema: Secondary | ICD-10-CM

## 2016-10-04 LAB — BASIC METABOLIC PANEL
ANION GAP: 8 (ref 5–15)
BUN: 15 mg/dL (ref 6–20)
CHLORIDE: 113 mmol/L — AB (ref 101–111)
CO2: 25 mmol/L (ref 22–32)
CREATININE: 1.18 mg/dL — AB (ref 0.44–1.00)
Calcium: 8.9 mg/dL (ref 8.9–10.3)
GFR calc non Af Amer: 50 mL/min — ABNORMAL LOW (ref >60)
GFR, EST AFRICAN AMERICAN: 58 mL/min — AB (ref >60)
Glucose, Bld: 113 mg/dL — ABNORMAL HIGH (ref 65–99)
POTASSIUM: 3.9 mmol/L (ref 3.5–5.1)
SODIUM: 146 mmol/L — AB (ref 135–145)

## 2016-10-04 LAB — CBC
HCT: 32 % — ABNORMAL LOW (ref 36.0–46.0)
Hemoglobin: 10.5 g/dL — ABNORMAL LOW (ref 12.0–15.0)
MCH: 26.9 pg (ref 26.0–34.0)
MCHC: 32.8 g/dL (ref 30.0–36.0)
MCV: 81.8 fL (ref 78.0–100.0)
Platelets: 341 10*3/uL (ref 150–400)
RBC: 3.91 MIL/uL (ref 3.87–5.11)
RDW: 16.1 % — ABNORMAL HIGH (ref 11.5–15.5)
WBC: 22.7 10*3/uL — AB (ref 4.0–10.5)

## 2016-10-04 MED ORDER — GLYCERIN (LAXATIVE) 2.1 G RE SUPP
1.0000 | RECTAL | Status: DC | PRN
  Filled 2016-10-04: qty 1

## 2016-10-04 MED ORDER — DOCUSATE SODIUM 100 MG PO CAPS
100.0000 mg | ORAL_CAPSULE | Freq: Two times a day (BID) | ORAL | Status: DC | PRN
  Administered 2016-10-04: 100 mg via ORAL
  Filled 2016-10-04: qty 1

## 2016-10-04 MED ORDER — POTASSIUM CHLORIDE IN NACL 20-0.45 MEQ/L-% IV SOLN
INTRAVENOUS | Status: DC
  Administered 2016-10-04: 08:00:00 via INTRAVENOUS
  Administered 2016-10-04: 1000 mL via INTRAVENOUS
  Administered 2016-10-06 – 2016-10-12 (×3): via INTRAVENOUS
  Filled 2016-10-04 (×5): qty 1000

## 2016-10-04 NOTE — Progress Notes (Signed)
2 Days Post-Op Procedure(s) (LRB): VIDEO BRONCHOSCOPY (N/A) ESOPHAGOSCOPY (Right) Subjective: Less pain  Objective: Vital signs in last 24 hours: Temp:  [98.4 F (36.9 C)-99.7 F (37.6 C)] 99.2 F (37.3 C) (06/18 0400) Pulse Rate:  [97-111] 99 (06/18 0600) Cardiac Rhythm: Sinus tachycardia (06/18 0400) Resp:  [0-50] 23 (06/18 0600) BP: (102-155)/(49-144) 138/79 (06/18 0600) SpO2:  [86 %-96 %] 91 % (06/18 0600) Arterial Line BP: (154)/(69) 154/69 (06/17 0800) FiO2 (%):  [35 %] 35 % (06/18 0424) Weight:  [243 lb 8 oz (110.5 kg)] 243 lb 8 oz (110.5 kg) (06/18 0042)  Hemodynamic parameters for last 24 hours:    Intake/Output from previous day: 06/17 0701 - 06/18 0700 In: 2137.9 [I.V.:1737.9; IV Piggyback:400] Out: -  Intake/Output this shift: No intake/output data recorded.  General appearance: alert, cooperative and no distress Neurologic: intact Heart: regular rate and rhythm Lungs: diminished breath sounds bibasilar Abdomen: normal findings: soft, non-tender  Lab Results:  Recent Labs  10/03/16 0308 10/04/16 0336  WBC 21.1* 22.7*  HGB 10.6* 10.5*  HCT 32.4* 32.0*  PLT 301 341   BMET:  Recent Labs  10/03/16 0308 10/04/16 0336  NA 142 146*  K 4.0 3.9  CL 109 113*  CO2 23 25  GLUCOSE 113* 113*  BUN 13 15  CREATININE 1.34* 1.18*  CALCIUM 8.4* 8.9    PT/INR:  Recent Labs  10/02/16 0945  LABPROT 15.8*  INR 1.25   ABG No results found for: PHART, HCO3, TCO2, ACIDBASEDEF, O2SAT CBG (last 3)  No results for input(s): GLUCAP in the last 72 hours.  Assessment/Plan: S/P Procedure(s) (LRB): VIDEO BRONCHOSCOPY (N/A) ESOPHAGOSCOPY (Right) -Pneumomediastinum  Fevers trending down on vanco and Zosyn, but WBC still elevated Renal function continues to improve Mild hypernatremia- change to 1/2 NS Will give ice chip today Will ask ENT to see her re: whether she needs upper airway scope Mobilize SCD + enoxaparin for DVT prophylaxis   LOS: 2 days     Melrose Nakayama 10/04/2016

## 2016-10-04 NOTE — Care Management Note (Signed)
Case Management Note Catherine Gibbons RN, [name] Unit 2W-Case Manager-- [place] coverage [phone]  Patient Details  Name: Catherine Erickson MRN: [id number] Date of Birth: 1956-04-03  Subjective/Objective:  Pt admitted with ?esophageal perforation- s/p Procedure(s) (LRB): VIDEO BRONCHOSCOPY (N/A) ESOPHAGOSCOPY (Right) -Pneumomediastinum- no evidence of esophageal perforation by Ct or EGD - pt with chronic long term trach.              Action/Plan: PTA pt lived at home with family- CM to follow- referral received for possible medication needs-   Expected Discharge Date:                  Expected Discharge Plan:     In-House Referral:     Discharge planning Services  CM Consult, Medication Assistance  Post Acute Care Choice:    Choice offered to:     DME Arranged:    DME Agency:     HH Arranged:    HH Agency:     Status of Service:  In process, will continue to follow  If discussed at Long Length of Stay Meetings, dates discussed:    Discharge Disposition:   Additional Comments:  Catherine Patricia, RN 10/04/2016, 12:41 PM

## 2016-10-04 NOTE — Progress Notes (Signed)
Patient ID: Catherine Erickson, female   DOB: Jul 18, 1955, 61 y.o.   MRN: [id number] EVENING ROUNDS NOTE :     [address]       [place] [address]             [phone]                 2 Days Post-Op Procedure(s) (LRB): VIDEO BRONCHOSCOPY (N/A) ESOPHAGOSCOPY (Right)  Total Length of Stay:  LOS: 2 days  BP (!) 172/102 (BP Location: Left Arm)   Pulse 82   Temp 98.6 F (37 C) (Oral)   Resp (!) 30   Ht 5\' 3"  (1.6 m)   Wt 243 lb 8 oz (110.5 kg)   SpO2 93%   BMI 43.13 kg/m   .Intake/Output      06/18 0701 - 06/19 0700   I.V. (mL/kg) 920 (8.3)   IV Piggyback 50   Total Intake(mL/kg) 970 (8.8)   Net +970       Urine Occurrence 2 x   Stool Occurrence 1 x     . 0.45 % NaCl with KCl 20 mEq / L 75 mL/hr at 10/04/16 2000  . piperacillin-tazobactam (ZOSYN)  IV Stopped (10/04/16 1851)  . vancomycin Stopped (10/04/16 0510)     Lab Results  Component Value Date   WBC 22.7 (H) 10/04/2016   HGB 10.5 (L) 10/04/2016   HCT 32.0 (L) 10/04/2016   PLT 341 10/04/2016   GLUCOSE 113 (H) 10/04/2016   CHOL 243 (H) 10/15/2009   TRIG 196 (H) 10/15/2009   HDL 47 10/15/2009   LDLCALC 157 (H) 10/15/2009   ALT 15 10/02/2016   AST 39 10/02/2016   NA 146 (H) 10/04/2016   K 3.9 10/04/2016   CL 113 (H) 10/04/2016   CREATININE 1.18 (H) 10/04/2016   BUN 15 10/04/2016   CO2 25 10/04/2016   TSH 1.868 03/18/2008   INR 1.25 10/02/2016   BP elevated  reanl function stable    Grace Isaac MD  Beeper [phone] Office [phone] 10/04/2016 8:37 PM

## 2016-10-04 NOTE — Progress Notes (Addendum)
Daily Rounding Note  10/04/2016, 12:38 PM  LOS: 2 days   SUBJECTIVE:   Chief complaint:   Hungry.  NPO with ice chips.   No BMs.  No pain  OBJECTIVE:         Vital signs in last 24 hours:    Temp:  [97.8 F (36.6 C)-99.7 F (37.6 C)] 97.8 F (36.6 C) (06/18 1100) Pulse Rate:  [94-107] 95 (06/18 1200) Resp:  [0-50] 24 (06/18 1200) BP: (104-159)/(49-144) 147/81 (06/18 1200) SpO2:  [87 %-100 %] 99 % (06/18 1221) FiO2 (%):  [35 %-40 %] 35 % (06/18 1221) Weight:  [110.5 kg (243 lb 8 oz)] 110.5 kg (243 lb 8 oz) (06/18 0042)   Filed Weights   10/02/16 0037 10/02/16 0854 10/04/16 0042  Weight: 117.9 kg (260 lb) 110.9 kg (244 lb 7.8 oz) 110.5 kg (243 lb 8 oz)   General: alert, pleasant, short statured, obese. Looks younger than 57.   Neck: trach in place Heart: RRR Chest: clear bil.   Abdomen: soft, NT, ND.  Obese.  BS hypoactive  Extremities: no CCE Neuro/Psych:  Pleasant, calm.  Alert.  Oriented x 3.  Moves al  Limbs.    Intake/Output from previous day: 06/17 0701 - 06/18 0700 In: 2137.9 [I.V.:1737.9; IV Piggyback:400] Out: -   Intake/Output this shift: Total I/O In: 170 [I.V.:170] Out: -   Lab Results:  Recent Labs  10/02/16 0105 10/03/16 0308 10/04/16 0336  WBC 25.1* 21.1* 22.7*  HGB 13.3 10.6* 10.5*  HCT 38.2 32.4* 32.0*  PLT 360 301 341   BMET  Recent Labs  10/02/16 0105 10/03/16 0308 10/04/16 0336  NA 139 142 146*  K 5.1 4.0 3.9  CL 105 109 113*  CO2 23 23 25   GLUCOSE 123* 113* 113*  BUN 15 13 15   CREATININE 1.52* 1.34* 1.18*  CALCIUM 9.2 8.4* 8.9   LFT  Recent Labs  10/02/16 0105  PROT 9.0*  ALBUMIN 3.8  AST 39  ALT 15  ALKPHOS 70  BILITOT 2.4*   PT/INR  Recent Labs  10/02/16 0945  LABPROT 15.8*  INR 1.25   Hepatitis Panel No results for input(s): HEPBSAG, HCVAB, HEPAIGM, HEPBIGM in the last 72 hours.  Studies/Results: X-ray Chest Pa Or Ap  Result Date:  10/02/2016 CLINICAL DATA:  Central line site infection EXAM: CHEST 1 VIEW COMPARISON:  Chest radiograph from earlier today. FINDINGS: Tracheostomy tube tip overlies the tracheal air column just below the thoracic inlet. Right subclavian central venous catheter terminates at the cavoatrial junction. Stable cardiomediastinal silhouette with mild cardiomegaly. No pneumothorax. Probable small bilateral pleural effusions, stable. Vascular crowding without overt pulmonary edema. Low lung volumes. Stable mild patchy bibasilar lung opacities. IMPRESSION: 1. No pneumothorax. Right subclavian central venous catheter terminates over the cavoatrial junction . 2. Low lung volumes. Stable patchy bibasilar lung opacities, favor atelectasis . 3. Stable small bilateral pleural effusions . 4. Stable mild cardiomegaly without overt pulmonary edema. Electronically Signed   By: Ilona Sorrel M.D.   On: 10/02/2016 14:04   Dg Chest Port 1 View  Result Date: 10/04/2016 CLINICAL DATA:  Pneumomediastinum.  Tracheostomy . EXAM: PORTABLE CHEST 1 VIEW COMPARISON:  None. FINDINGS: Tracheostomy in stable position. Right subclavian line stable position. Cardiomegaly with persistent bibasilar infiltrates/edema. Bibasilar atelectasis. Small bilateral pleural effusions IMPRESSION: 1. Lines and tubes in stable position. 2. Cardiomegaly with persistent bibasilar infiltrates/edema and small bilateral pleural effusions. 3. Low lung volumes with basilar atelectasis.  Chest is unchanged from prior exam. Electronically Signed   By: Marcello Moores  Register   On: 10/04/2016 07:31   Dg Chest Port 1 View  Result Date: 10/03/2016 CLINICAL DATA:  Pneumomediastinum. EXAM: PORTABLE CHEST 1 VIEW COMPARISON:  10/02/2016 FINDINGS: Tracheostomy tube is in place, tip well positioned in the central trachea. Right subclavian central venous line tip overlies the level of superior vena cava. Heart is enlarged. There is opacification of the left lower lobe, obscuring the  hemidiaphragm. Right pleural effusion and right basilar opacity persists and appears increased. IMPRESSION: 1. Persistent cardiomegaly. 2. Increasing opacities at the lung bases. Electronically Signed   By: Nolon Nations M.D.   On: 10/03/2016 07:57   Scheduled Meds: . chlorhexidine  15 mL Mouth Rinse BID  . Chlorhexidine Gluconate Cloth  6 each Topical Daily  . enoxaparin (LOVENOX) injection  40 mg Subcutaneous Q24H  . mouth rinse  15 mL Mouth Rinse q12n4p  . metoprolol tartrate  5 mg Intravenous Q6H  . pantoprazole (PROTONIX) IV  40 mg Intravenous Q24H  . [START ON 10/06/2016] pneumococcal 23 valent vaccine  0.5 mL Intramuscular Tomorrow-1000  . sodium chloride flush  10-40 mL Intracatheter Q12H   Continuous Infusions: . 0.45 % NaCl with KCl 20 mEq / L 75 mL/hr at 10/04/16 0800  . piperacillin-tazobactam (ZOSYN)  IV Stopped (10/04/16 1017)  . vancomycin Stopped (10/04/16 0510)   PRN Meds:.Glycerin (Adult), hydrALAZINE, metoprolol tartrate, morphine injection, sodium chloride flush  ASSESMENT:   *  Lye ingestion age 61.  Permanent tracheostomy,   *  Pneumomediastinum with acute chest pain, possible pharyngeal or esophageal perf.  S/p 10/02/16 bronchoscopy, Dr Roxan Hockey: Normal endobronchial anatomy, some edema of the mucosa and blunting of the carinas, no endobronchial lesions seen, minimal secretions. 10/02/16 EGD: with esophageal dilatation to 7mm of benign upper esophageal stenosis, otherwise normal appearing. Ongoing Vanc and Zosyn.    PLAN   *  Management of diet per Dr Roxan Hockey.  GI will sign off.     Azucena Freed  10/04/2016, 12:38 PM Pager: [phone]    Attending physician's note   I have taken an interval history, reviewed the chart and examined the patient. I agree with the Advanced Practitioner's note, impression and recommendations. Pneumomediastinum without evidence of esophageal perforation at EGD. A benign appearing upper esophageal stricture was dilated  during EGD, otherwise the EGD appeared normal. Pt taking ice chips and notes less chest pain. Mgmt per Dr. Roxan Hockey. GI signing off.   Lucio Edward, MD [facility] [phone] Mon-Fri 8a-5p [phone] after 5p, weekends, holidays

## 2016-10-05 ENCOUNTER — Inpatient Hospital Stay (HOSPITAL_COMMUNITY): Payer: Medicaid Other

## 2016-10-05 ENCOUNTER — Encounter (HOSPITAL_COMMUNITY): Payer: Self-pay | Admitting: *Deleted

## 2016-10-05 LAB — CBC
HEMATOCRIT: 29.4 % — AB (ref 36.0–46.0)
HEMOGLOBIN: 9.6 g/dL — AB (ref 12.0–15.0)
MCH: 26.6 pg (ref 26.0–34.0)
MCHC: 32.7 g/dL (ref 30.0–36.0)
MCV: 81.4 fL (ref 78.0–100.0)
Platelets: 348 10*3/uL (ref 150–400)
RBC: 3.61 MIL/uL — ABNORMAL LOW (ref 3.87–5.11)
RDW: 16.2 % — ABNORMAL HIGH (ref 11.5–15.5)
WBC: 21 10*3/uL — ABNORMAL HIGH (ref 4.0–10.5)

## 2016-10-05 LAB — BASIC METABOLIC PANEL
ANION GAP: 7 (ref 5–15)
BUN: 13 mg/dL (ref 6–20)
CHLORIDE: 111 mmol/L (ref 101–111)
CO2: 26 mmol/L (ref 22–32)
Calcium: 8.8 mg/dL — ABNORMAL LOW (ref 8.9–10.3)
Creatinine, Ser: 1.01 mg/dL — ABNORMAL HIGH (ref 0.44–1.00)
GFR calc Af Amer: >60 mL/min (ref >60)
GFR calc non Af Amer: >60 mL/min (ref >60)
GLUCOSE: 119 mg/dL — AB (ref 65–99)
POTASSIUM: 3.8 mmol/L (ref 3.5–5.1)
Sodium: 144 mmol/L (ref 135–145)

## 2016-10-05 MED ORDER — GUAIFENESIN-DM 100-10 MG/5ML PO SYRP
15.0000 mL | ORAL_SOLUTION | ORAL | Status: DC | PRN
  Administered 2016-10-05 – 2016-10-11 (×14): 15 mL via ORAL
  Filled 2016-10-05 (×14): qty 15

## 2016-10-05 NOTE — Progress Notes (Signed)
[facility] BRIEF SICU PROGRESS NOTE  3 Days Post-Op  S/P Procedure(s) (LRB): VIDEO BRONCHOSCOPY (N/A) ESOPHAGOSCOPY (Right)   Stable day  Plan: Waiting for bed on step down unit  Rexene Alberts, MD 10/05/2016 7:36 PM

## 2016-10-05 NOTE — Progress Notes (Signed)
Family at bedside trimming patient toe nails.

## 2016-10-05 NOTE — Progress Notes (Signed)
Report given to Rachel Bo, RN   Pt transported via [facility] with )2 and cardiac monitor to 4 E 11.

## 2016-10-05 NOTE — Progress Notes (Signed)
3 Days Post-Op Procedure(s) (LRB): VIDEO BRONCHOSCOPY (N/A) ESOPHAGOSCOPY (Right) Subjective: No complaints this AM. Denies pain and nausea  Objective: Vital signs in last 24 hours: Temp:  [97.8 F (36.6 C)-98.6 F (37 C)] 98.4 F (36.9 C) (06/19 0400) Pulse Rate:  [80-105] 80 (06/19 0700) Cardiac Rhythm: Normal sinus rhythm (06/19 0700) Resp:  [11-42] 22 (06/19 0700) BP: (80-173)/(35-131) 139/111 (06/19 0700) SpO2:  [88 %-100 %] 100 % (06/19 0700) FiO2 (%):  [35 %-40 %] 35 % (06/19 0700)  Hemodynamic parameters for last 24 hours:    Intake/Output from previous day: 06/18 0701 - 06/19 0700 In: 2195 [I.V.:1745; IV Piggyback:450] Out: 500 [Urine:500] Intake/Output this shift: No intake/output data recorded.  General appearance: alert, cooperative and no distress Neurologic: intact Heart: regular rate and rhythm Lungs: diminished breath sounds bibasilar  Lab Results:  Recent Labs  10/04/16 0336 10/05/16 0412  WBC 22.7* 21.0*  HGB 10.5* 9.6*  HCT 32.0* 29.4*  PLT 341 348   BMET:  Recent Labs  10/04/16 0336 10/05/16 0412  NA 146* 144  K 3.9 3.8  CL 113* 111  CO2 25 26  GLUCOSE 113* 119*  BUN 15 13  CREATININE 1.18* 1.01*  CALCIUM 8.9 8.8*    PT/INR:  Recent Labs  10/02/16 0945  LABPROT 15.8*  INR 1.25   ABG No results found for: PHART, HCO3, TCO2, ACIDBASEDEF, O2SAT CBG (last 3)  No results for input(s): GLUCAP in the last 72 hours.  Assessment/Plan: S/P Procedure(s) (LRB): VIDEO BRONCHOSCOPY (N/A) ESOPHAGOSCOPY (Right) Plan for transfer to step-down: see transfer orders  Pneumomediastinum with no evidence of esophageal perforation  Clinically improved with pain resolved and no fevers  WBC slowly trending down but still elevated- follow Will advance to clear liquid diet IV to KVO Bilateral pleural effusions- check PA/Lat tomorrow to get a better sense of how large they are Mobilize Enoxaparin for DVT prophylaxis   LOS: 3 days     Melrose Nakayama 10/05/2016

## 2016-10-06 ENCOUNTER — Inpatient Hospital Stay (HOSPITAL_COMMUNITY): Payer: Medicaid Other

## 2016-10-06 LAB — VANCOMYCIN, TROUGH: Vancomycin Tr: 20 ug/mL (ref 15–20)

## 2016-10-06 LAB — CBC
HCT: 30 % — ABNORMAL LOW (ref 36.0–46.0)
HEMOGLOBIN: 9.8 g/dL — AB (ref 12.0–15.0)
MCH: 26.4 pg (ref 26.0–34.0)
MCHC: 32.7 g/dL (ref 30.0–36.0)
MCV: 80.9 fL (ref 78.0–100.0)
Platelets: 378 10*3/uL (ref 150–400)
RBC: 3.71 MIL/uL — ABNORMAL LOW (ref 3.87–5.11)
RDW: 16.2 % — ABNORMAL HIGH (ref 11.5–15.5)
WBC: 22.3 10*3/uL — ABNORMAL HIGH (ref 4.0–10.5)

## 2016-10-06 NOTE — Progress Notes (Addendum)
[address]       [place] [address]             [phone]      4 Days Post-Op Procedure(s) (LRB): VIDEO BRONCHOSCOPY (N/A) ESOPHAGOSCOPY (Right) Subjective: Feels better, not SOB , no pain or nausea  Objective: Vital signs in last 24 hours: Temp:  [97.7 F (36.5 C)-99.4 F (37.4 C)] 98.1 F (36.7 C) (06/20 0734) Pulse Rate:  [66-94] 81 (06/20 0409) Cardiac Rhythm: Normal sinus rhythm (06/20 0700) Resp:  [20-43] 20 (06/20 0409) BP: (133-176)/(60-139) 158/79 (06/20 0734) SpO2:  [91 %-100 %] 99 % (06/20 0409) FiO2 (%):  [30 %-35 %] 30 % (06/20 0409)  Hemodynamic parameters for last 24 hours:    Intake/Output from previous day: 06/19 0701 - 06/20 0700 In: 465 [P.O.:240; I.V.:175; IV Piggyback:50] Out: 500 [Urine:500] Intake/Output this shift: No intake/output data recorded.  General appearance: alert, cooperative and no distress Heart: regular rate and rhythm Lungs: dim in lower fields Abdomen: soft, + BS, Nontender Extremities: minor edema Wound: n//a  Lab Results:  Recent Labs  10/05/16 0412 10/06/16 0345  WBC 21.0* 22.3*  HGB 9.6* 9.8*  HCT 29.4* 30.0*  PLT 348 378   BMET:  Recent Labs  10/04/16 0336 10/05/16 0412  NA 146* 144  K 3.9 3.8  CL 113* 111  CO2 25 26  GLUCOSE 113* 119*  BUN 15 13  CREATININE 1.18* 1.01*  CALCIUM 8.9 8.8*    PT/INR: No results for input(s): LABPROT, INR in the last 72 hours. ABG No results found for: PHART, HCO3, TCO2, ACIDBASEDEF, O2SAT CBG (last 3)  No results for input(s): GLUCAP in the last 72 hours.  Meds Scheduled Meds: . chlorhexidine  15 mL Mouth Rinse BID  . Chlorhexidine Gluconate Cloth  6 each Topical Daily  . enoxaparin (LOVENOX) injection  40 mg Subcutaneous Q24H  . mouth rinse  15 mL Mouth Rinse q12n4p  . metoprolol tartrate  5 mg Intravenous Q6H  . pantoprazole (PROTONIX) IV  40 mg Intravenous Q24H  . pneumococcal 23 valent vaccine  0.5 mL Intramuscular Tomorrow-1000    . sodium chloride flush  10-40 mL Intracatheter Q12H   Continuous Infusions: . 0.45 % NaCl with KCl 20 mEq / L 10 mL/hr at 10/06/16 0344  . piperacillin-tazobactam (ZOSYN)  IV 3.375 g (10/06/16 0656)  . vancomycin Stopped (10/06/16 0524)   PRN Meds:.Glycerin (Adult), guaiFENesin-dextromethorphan, hydrALAZINE, metoprolol tartrate, morphine injection, sodium chloride flush  Xrays Dg Chest 2 View  Result Date: 10/06/2016 CLINICAL DATA:  61 year old female with remote history of lying ingestion 35 years ago, permanent tracheostomy. Sudden onset chest pain with fever, found have Posterior pneumomediastinum suspicious for esophageal perforation. Status post intraoperative bronchoscopy and EGD 4 days ago with no definite esophageal or bronchial perforation found. EXAM: CHEST  2 VIEW COMPARISON:  10/05/2016 and earlier FINDINGS: Seated AP and lateral views of the chest. Stable tracheostomy. Right IJ central line remains in place. Stable cardiomegaly and mediastinal contours. Small to moderate bilateral pleural effusions. Dense bibasilar opacity.   No acute pulmonary edema. No pneumothorax. Paucity of bowel gas in the upper abdomen. No pneumoperitoneum identified. No acute osseous abnormality identified. IMPRESSION: 1. Small to moderate bilateral pleural effusions with bibasilar collapse or consolidation. 2. Stable tracheostomy tube and right subclavian central line. Electronically Signed   By: Genevie Ann M.D.   On: 10/06/2016 08:28   Dg Chest Port 1 View  Result Date: 10/05/2016 CLINICAL  DATA:  Shortness of Breath EXAM: PORTABLE CHEST 1 VIEW COMPARISON:  10/04/2016 FINDINGS: Cardiac shadow remains enlarged. Tracheostomy tube and right-sided central venous line are again seen and stable. Bilateral pleural effusions and bibasilar atelectasis is again seen. The overall appearance is stable from the previous exam. No new focal abnormality is noted. IMPRESSION: No significant change in bibasilar effusions and  atelectasis. Electronically Signed   By: Inez Catalina M.D.   On: 10/05/2016 07:19    Assessment/Plan: S/P Procedure(s) (LRB): VIDEO BRONCHOSCOPY (N/A) ESOPHAGOSCOPY (Right)  1 steady improvement 2 CXR-  Stable effusions/consolidation 3 leukocytosis slightly worse, tmax 99.4- on zosyn/vanco 4 slow advance of diet- appears to be tol clears without difficulty 5 sq lovenox for DVT prophylaxis   LOS: 4 days    GOLD,WAYNE E 10/06/2016 Patient seen and examined, agree with above Everything looks better except WBC which remains elevated- follow Will try full liquids today  Remo Lipps C. Roxan Hockey, MD [facility] [phone]

## 2016-10-06 NOTE — Plan of Care (Signed)
Problem: Nutrition: Goal: Adequate nutrition will be maintained Outcome: Progressing Advanced to full liquid diet today.

## 2016-10-06 NOTE — Care Management Note (Addendum)
Case Management Note  Patient Details  Name: Catherine Erickson MRN: [id number] Date of Birth: 05/09/1955  Subjective/Objective:     Pt admitted with ?esophageal perforation- s/p Procedure(s) (LRB): VIDEO BRONCHOSCOPY (N/A) ESOPHAGOSCOPY (Right)  -Pneumomediastinum- no evidence of esophageal perforation by Ct or EGD - pt with chronic long term trach.   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  She has PCP , she could not think of name.  She is on 30% trach collar right now and states she is not on any oxygen at home, she does her own trach care and get her own trach supplies.                           Action/Plan: PTA pt lived at home with family- CM to follow- referral received for possible medication needs  Expected Discharge Date:                  Expected Discharge Plan:  Home/Self Care  In-House Referral:     Discharge planning Services  CM Consult, Medication Assistance  Post Acute Care Choice:    Choice offered to:     DME Arranged:    DME Agency:     HH Arranged:    HH Agency:     Status of Service:  In process, will continue to follow  If discussed at Long Length of Stay Meetings, dates discussed:    Additional Comments:  Catherine Mayo, RN 10/06/2016, 9:27 AM

## 2016-10-06 NOTE — Progress Notes (Signed)
Pharmacy Antibiotic Note   Vanc trough this am (20) cannot be interpreted because lab needed to be redrawn after next dose had already been running for ~15 minutes.  Will collect again tomorrow am prior to next dose.   Thank you for allowing pharmacy to be a part of this patient's care.  Wynona Neat, PharmD, BCPS  10/06/2016 5:21 AM

## 2016-10-07 LAB — CBC
HCT: 31.5 % — ABNORMAL LOW (ref 36.0–46.0)
HEMOGLOBIN: 10.4 g/dL — AB (ref 12.0–15.0)
MCH: 26.7 pg (ref 26.0–34.0)
MCHC: 33 g/dL (ref 30.0–36.0)
MCV: 80.8 fL (ref 78.0–100.0)
PLATELETS: 415 10*3/uL — AB (ref 150–400)
RBC: 3.9 MIL/uL (ref 3.87–5.11)
RDW: 16.1 % — ABNORMAL HIGH (ref 11.5–15.5)
WBC: 25.9 10*3/uL — ABNORMAL HIGH (ref 4.0–10.5)

## 2016-10-07 LAB — CULTURE, BLOOD (ROUTINE X 2)
Culture: NO GROWTH
Culture: NO GROWTH
SPECIAL REQUESTS: ADEQUATE
Special Requests: ADEQUATE

## 2016-10-07 LAB — VANCOMYCIN, TROUGH: VANCOMYCIN TR: 10 ug/mL — AB (ref 15–20)

## 2016-10-07 MED ORDER — VANCOMYCIN HCL 10 G IV SOLR
2000.0000 mg | INTRAVENOUS | Status: AC
  Administered 2016-10-07 – 2016-10-09 (×3): 2000 mg via INTRAVENOUS
  Filled 2016-10-07 (×4): qty 2000

## 2016-10-07 MED ORDER — LEVALBUTEROL HCL 0.63 MG/3ML IN NEBU
0.6300 mg | INHALATION_SOLUTION | Freq: Three times a day (TID) | RESPIRATORY_TRACT | Status: DC
  Administered 2016-10-07 – 2016-10-12 (×17): 0.63 mg via RESPIRATORY_TRACT
  Filled 2016-10-07 (×16): qty 3

## 2016-10-07 MED ORDER — LEVALBUTEROL HCL 0.63 MG/3ML IN NEBU
INHALATION_SOLUTION | RESPIRATORY_TRACT | Status: AC
  Filled 2016-10-07: qty 3

## 2016-10-07 NOTE — Progress Notes (Addendum)
[address]       [place] [address]             [phone]      5 Days Post-Op Procedure(s) (LRB): VIDEO BRONCHOSCOPY (N/A) ESOPHAGOSCOPY (Right) Subjective: Feels well overall  Objective: Vital signs in last 24 hours: Temp:  [98.4 F (36.9 C)-99 F (37.2 C)] 98.6 F (37 C) (06/21 0700) Pulse Rate:  [67-86] 67 (06/21 0700) Cardiac Rhythm: Normal sinus rhythm (06/21 0700) Resp:  [19-27] 20 (06/21 0700) BP: (158-190)/(79-90) 179/86 (06/21 0700) SpO2:  [93 %-100 %] 94 % (06/21 0700) FiO2 (%):  [30 %] 30 % (06/21 0416)  Hemodynamic parameters for last 24 hours:    Intake/Output from previous day: 06/20 0701 - 06/21 0700 In: 920 [P.O.:560; I.V.:210; IV Piggyback:150] Out: 300 [Urine:300] Intake/Output this shift: No intake/output data recorded.  General appearance: alert, cooperative and no distress Heart: regular rate and rhythm Lungs: dim in left>right base ,mild wheeze Abdomen: benign Extremities: no edema  Lab Results:  Recent Labs  10/06/16 0345 10/07/16 0300  WBC 22.3* 25.9*  HGB 9.8* 10.4*  HCT 30.0* 31.5*  PLT 378 415*   BMET:  Recent Labs  10/05/16 0412  NA 144  K 3.8  CL 111  CO2 26  GLUCOSE 119*  BUN 13  CREATININE 1.01*  CALCIUM 8.8*    PT/INR: No results for input(s): LABPROT, INR in the last 72 hours. ABG No results found for: PHART, HCO3, TCO2, ACIDBASEDEF, O2SAT CBG (last 3)  No results for input(s): GLUCAP in the last 72 hours.  Meds Scheduled Meds: . chlorhexidine  15 mL Mouth Rinse BID  . Chlorhexidine Gluconate Cloth  6 each Topical Daily  . enoxaparin (LOVENOX) injection  40 mg Subcutaneous Q24H  . mouth rinse  15 mL Mouth Rinse q12n4p  . metoprolol tartrate  5 mg Intravenous Q6H  . pantoprazole (PROTONIX) IV  40 mg Intravenous Q24H  . pneumococcal 23 valent vaccine  0.5 mL Intramuscular Tomorrow-1000  . sodium chloride flush  10-40 mL Intracatheter Q12H   Continuous Infusions: . 0.45 % NaCl  with KCl 20 mEq / L 10 mL/hr at 10/06/16 2000  . piperacillin-tazobactam (ZOSYN)  IV 3.375 g (10/07/16 0647)  . vancomycin     PRN Meds:.Glycerin (Adult), guaiFENesin-dextromethorphan, hydrALAZINE, metoprolol tartrate, morphine injection, sodium chloride flush  Xrays Dg Chest 2 View  Result Date: 10/06/2016 CLINICAL DATA:  61 year old female with remote history of lying ingestion 35 years ago, permanent tracheostomy. Sudden onset chest pain with fever, found have Posterior pneumomediastinum suspicious for esophageal perforation. Status post intraoperative bronchoscopy and EGD 4 days ago with no definite esophageal or bronchial perforation found. EXAM: CHEST  2 VIEW COMPARISON:  10/05/2016 and earlier FINDINGS: Seated AP and lateral views of the chest. Stable tracheostomy. Right IJ central line remains in place. Stable cardiomegaly and mediastinal contours. Small to moderate bilateral pleural effusions. Dense bibasilar opacity.   No acute pulmonary edema. No pneumothorax. Paucity of bowel gas in the upper abdomen. No pneumoperitoneum identified. No acute osseous abnormality identified. IMPRESSION: 1. Small to moderate bilateral pleural effusions with bibasilar collapse or consolidation. 2. Stable tracheostomy tube and right subclavian central line. Electronically Signed   By: Genevie Ann M.D.   On: 10/06/2016 08:28    Assessment/Plan: S/P Procedure(s) (LRB): VIDEO BRONCHOSCOPY (N/A) ESOPHAGOSCOPY (Right)   1 looks pretty good, tolerating diet without problem 2 leukocytosis still rising- cont abx and maximize pulm toilet as able,  eill add xopenex 3 poss advance diet further soon  LOS: 5 days    GOLD,WAYNE E 10/07/2016 Patient seen and examined, agree with above Will try soft diet  Remo Lipps C. Roxan Hockey, MD [facility] [phone]

## 2016-10-07 NOTE — Progress Notes (Signed)
Pharmacy Antibiotic Note  Catherine Erickson is a 61 y.o. female admitted on 10/02/2016 with sepsis.  Pharmacy has been consulted for vancomycin dosing.  Vanc trough below goal this am.  Plan: Change vancomycin to 2000mg  IV every 24 hours for calculated trough ~16.  Goal trough 15-20 mcg/mL.  Height: 5\' 3"  (160 cm) Weight: 243 lb 8 oz (110.5 kg) IBW/kg (Calculated) : 52.4  Temp (24hrs), Avg:98.5 F (36.9 C), Min:98.1 F (36.7 C), Max:99 F (37.2 C)   Recent Labs Lab 10/02/16 0105 10/02/16 0118 10/02/16 0502 10/03/16 0308 10/04/16 0336 10/05/16 0412 10/06/16 0345 10/06/16 0425 10/07/16 0300 10/07/16 0330  WBC 25.1*  --   --  21.1* 22.7* 21.0* 22.3*  --  25.9*  --   CREATININE 1.52*  --   --  1.34* 1.18* 1.01*  --   --   --   --   LATICACIDVEN  --  1.93* 0.83  --   --   --   --   --   --   --   VANCOTROUGH  --   --   --   --   --   --   --  20  --  10*    Estimated Creatinine Clearance: 72.5 mL/min (A) (by C-G formula based on SCr of 1.01 mg/dL (H)).    No Known Allergies  Antimicrobials this admission: Zosyn 6/16 >>  Vancomycin 6/16 >>   Dose adjustments this admission: Vanc 1250mg  Q24H >> trough 10 >> vanc 2000mg  Q24H  Microbiology results: 6/16 BCx x 2: ngtd 6/16 UCx: < 10K col/ml, insignificant growth 6/16 MRSA PCR: neg  Thank you for allowing pharmacy to be a part of this patient's care.  Wynona Neat, PharmD, BCPS  10/07/2016 4:19 AM

## 2016-10-08 ENCOUNTER — Inpatient Hospital Stay (HOSPITAL_COMMUNITY): Payer: Medicaid Other

## 2016-10-08 LAB — CBC WITH DIFFERENTIAL/PLATELET
BASOS ABS: 0 10*3/uL (ref 0.0–0.1)
Basophils Relative: 0 %
Eosinophils Absolute: 0.2 10*3/uL (ref 0.0–0.7)
Eosinophils Relative: 1 %
HCT: 32.7 % — ABNORMAL LOW (ref 36.0–46.0)
HEMOGLOBIN: 10.8 g/dL — AB (ref 12.0–15.0)
LYMPHS PCT: 14 %
Lymphs Abs: 3.2 10*3/uL (ref 0.7–4.0)
MCH: 26.4 pg (ref 26.0–34.0)
MCHC: 33 g/dL (ref 30.0–36.0)
MCV: 80 fL (ref 78.0–100.0)
Monocytes Absolute: 2.3 10*3/uL — ABNORMAL HIGH (ref 0.1–1.0)
Monocytes Relative: 10 %
NEUTROS ABS: 17.1 10*3/uL — AB (ref 1.7–7.7)
NEUTROS PCT: 75 %
PLATELETS: 453 10*3/uL — AB (ref 150–400)
RBC: 4.09 MIL/uL (ref 3.87–5.11)
RDW: 15.9 % — ABNORMAL HIGH (ref 11.5–15.5)
WBC: 22.8 10*3/uL — ABNORMAL HIGH (ref 4.0–10.5)

## 2016-10-08 MED ORDER — AMOXICILLIN-POT CLAVULANATE 875-125 MG PO TABS
1.0000 | ORAL_TABLET | Freq: Two times a day (BID) | ORAL | Status: DC
  Administered 2016-10-10 – 2016-10-12 (×5): 1 via ORAL
  Filled 2016-10-08 (×6): qty 1

## 2016-10-08 NOTE — Progress Notes (Addendum)
      [address]       [place] [address]             [phone]      6 Days Post-Op Procedure(s) (LRB): VIDEO BRONCHOSCOPY (N/A) ESOPHAGOSCOPY (Right) Subjective: No issues. Feels well and is tolerating a soft diet  Objective: Vital signs in last 24 hours: Temp:  [98.4 F (36.9 C)-98.7 F (37.1 C)] 98.6 F (37 C) (06/22 0330) Pulse Rate:  [64-100] 85 (06/22 0700) Cardiac Rhythm: Normal sinus rhythm (06/22 0610) Resp:  [18-31] 26 (06/22 0700) BP: (144-199)/(24-103) 161/65 (06/22 0700) SpO2:  [88 %-99 %] 93 % (06/22 0700) FiO2 (%):  [30 %] 30 % (06/22 0348)     Intake/Output from previous day: 06/21 0701 - 06/22 0700 In: 974.2 [P.O.:53; I.V.:221.2; IV Piggyback:700] Out: 2625 [Urine:2625] Intake/Output this shift: No intake/output data recorded.  General appearance: alert, cooperative and no distress Heart: regular rate and rhythm, S1, S2 normal, no murmur, click, rub or gallop Lungs: course breath sounds Abdomen: soft, non-tender Extremities: extremities normal, atraumatic, no cyanosis or edema  Lab Results:  Recent Labs  10/07/16 0300 10/08/16 0524  WBC 25.9* 22.8*  HGB 10.4* 10.8*  HCT 31.5* 32.7*  PLT 415* 453*   BMET: No results for input(s): NA, K, CL, CO2, GLUCOSE, BUN, CREATININE, CALCIUM in the last 72 hours.  PT/INR: No results for input(s): LABPROT, INR in the last 72 hours. ABG No results found for: PHART, HCO3, TCO2, ACIDBASEDEF, O2SAT CBG (last 3)  No results for input(s): GLUCAP in the last 72 hours.  Assessment/Plan: S/P Procedure(s) (LRB): VIDEO BRONCHOSCOPY (N/A) ESOPHAGOSCOPY (Right)  1. Tolerating diet, changed to soft yesterday 2. Leukocytosis now trending down, 22.8 this morning. Continue Zosyn and Vanc 3. Continue pulm toilet and xopenex nebs as needed  Plan: Making good progress. Continue care.     LOS: 6 days    Elgie Collard 10/08/2016  Patient seen and examined, agree with above In good  spirits Plan continue IV antibiotics through tomorrow then change to PO for 2 weeks beginning 6/24 Home Sunday or Monday  Remo Lipps C. Roxan Hockey, MD [facility] [phone]

## 2016-10-09 ENCOUNTER — Inpatient Hospital Stay (HOSPITAL_COMMUNITY): Payer: Medicaid Other

## 2016-10-09 DIAGNOSIS — M7989 Other specified soft tissue disorders: Secondary | ICD-10-CM

## 2016-10-09 DIAGNOSIS — R0602 Shortness of breath: Secondary | ICD-10-CM

## 2016-10-09 MED ORDER — ZOLPIDEM TARTRATE 5 MG PO TABS
5.0000 mg | ORAL_TABLET | Freq: Every evening | ORAL | Status: DC | PRN
  Administered 2016-10-09 – 2016-10-11 (×3): 5 mg via ORAL
  Filled 2016-10-09 (×3): qty 1

## 2016-10-09 NOTE — Progress Notes (Signed)
VASCULAR LAB PRELIMINARY  PRELIMINARY  PRELIMINARY  PRELIMINARY  Bilateral lower extremity venous duplex completed.    Preliminary report:  There is no obvious evidence of DVT or SVT noted in the bilateral lower extremities.   Samara Stankowski, RVT 10/09/2016, 12:17 PM

## 2016-10-09 NOTE — Progress Notes (Signed)
[address]       [place] [address]             [phone]      7 Days Post-Op Procedure(s) (LRB): VIDEO BRONCHOSCOPY (N/A) ESOPHAGOSCOPY (Right) Subjective: Not felling as well today. Tolerating diet, some pain in chest/back  Objective: Vital signs in last 24 hours: Temp:  [97.7 F (36.5 C)-98.7 F (37.1 C)] 98.4 F (36.9 C) (06/23 0700) Pulse Rate:  [62-98] 81 (06/23 0733) Cardiac Rhythm: Normal sinus rhythm (06/22 2000) Resp:  [20-32] 26 (06/23 0733) BP: (112-185)/(47-92) 140/77 (06/23 0700) SpO2:  [89 %-98 %] 95 % (06/23 0733) FiO2 (%):  [30 %] 30 % (06/23 0731)  Hemodynamic parameters for last 24 hours:    Intake/Output from previous day: 06/22 0701 - 06/23 0700 In: 1065.5 [P.O.:240; I.V.:225.5; IV Piggyback:600] Out: 825 [Urine:825] Intake/Output this shift: Total I/O In: 103.5 [I.V.:53.5; IV Piggyback:50] Out: 400 [Urine:400]  General appearance: alert, cooperative, fatigued and no distress Heart: regular rate and rhythm Lungs: clear to auscultation bilaterally Abdomen: soft, non-tender Extremities: right leg is more edematous and swollen today Wound: n/a  Lab Results:  Recent Labs  10/07/16 0300 10/08/16 0524  WBC 25.9* 22.8*  HGB 10.4* 10.8*  HCT 31.5* 32.7*  PLT 415* 453*   BMET: No results for input(s): NA, K, CL, CO2, GLUCOSE, BUN, CREATININE, CALCIUM in the last 72 hours.  PT/INR: No results for input(s): LABPROT, INR in the last 72 hours. ABG No results found for: PHART, HCO3, TCO2, ACIDBASEDEF, O2SAT CBG (last 3)  No results for input(s): GLUCAP in the last 72 hours.  Meds Scheduled Meds: . [START ON 10/10/2016] amoxicillin-clavulanate  1 tablet Oral Q12H  . chlorhexidine  15 mL Mouth Rinse BID  . Chlorhexidine Gluconate Cloth  6 each Topical Daily  . enoxaparin (LOVENOX) injection  40 mg Subcutaneous Q24H  . levalbuterol  0.63 mg Nebulization TID  . mouth rinse  15 mL Mouth Rinse q12n4p  . metoprolol  tartrate  5 mg Intravenous Q6H  . pantoprazole (PROTONIX) IV  40 mg Intravenous Q24H  . pneumococcal 23 valent vaccine  0.5 mL Intramuscular Tomorrow-1000  . sodium chloride flush  10-40 mL Intracatheter Q12H   Continuous Infusions: . 0.45 % NaCl with KCl 20 mEq / L 10 mL/hr at 10/08/16 2338  . piperacillin-tazobactam (ZOSYN)  IV Stopped (10/09/16 0901)  . vancomycin Stopped (10/08/16 1921)   PRN Meds:.Glycerin (Adult), guaiFENesin-dextromethorphan, hydrALAZINE, metoprolol tartrate, morphine injection, sodium chloride flush  Xrays Dg Chest 2 View  Result Date: 10/08/2016 CLINICAL DATA:  Weakness, shortness of Breath EXAM: CHEST  2 VIEW COMPARISON:  10/06/2016 FINDINGS: Tracheostomy and right central line are unchanged. Low lung volumes. There is cardiomegaly, vascular congestion, and bibasilar opacities, likely atelectasis. Small bilateral pleural effusions. IMPRESSION: Low lung volumes with vascular congestion, bibasilar atelectasis and bilateral effusions. Electronically Signed   By: Rolm Baptise M.D.   On: 10/08/2016 08:06   Results for orders placed or performed during the hospital encounter of 10/02/16  Blood Culture (routine x 2)     Status: None   Collection Time: 10/02/16  1:06 AM  Result Value Ref Range Status   Specimen Description BLOOD RIGHT ANTECUBITAL  Final   Special Requests   Final    BOTTLES DRAWN AEROBIC AND ANAEROBIC Blood Culture adequate volume   Culture   Final    NO GROWTH 5 DAYS Performed at [facility], [place] [address], [place], [place]  [address]    Report Status 10/07/2016 FINAL  Final  Blood Culture (routine x 2)     Status: None   Collection Time: 10/02/16  1:25 AM  Result Value Ref Range Status   Specimen Description BLOOD LEFT HAND  Final   Special Requests   Final    BOTTLES DRAWN AEROBIC AND ANAEROBIC Blood Culture adequate volume   Culture   Final    NO GROWTH 5 DAYS Performed at [facility], [place] [address] [place], [place]  [address]    Report Status 10/07/2016 FINAL  Final  Urine culture     Status: Abnormal   Collection Time: 10/02/16  4:50 AM  Result Value Ref Range Status   Specimen Description URINE, RANDOM  Final   Special Requests NONE  Final   Culture (A)  Final    <10,000 COLONIES/mL INSIGNIFICANT GROWTH Performed at [facility], [place] [address], [place], [place] [address]    Report Status 10/03/2016 FINAL  Final  MRSA PCR Screening     Status: None   Collection Time: 10/02/16 10:02 AM  Result Value Ref Range Status   MRSA by PCR NEGATIVE NEGATIVE Final    Comment:        The GeneXpert MRSA Assay (FDA approved for NASAL specimens only), is one component of a comprehensive MRSA colonization surveillance program. It is not intended to diagnose MRSA infection nor to guide or monitor treatment for MRSA infections.     Assessment/Plan: S/P Procedure(s) (LRB): VIDEO BRONCHOSCOPY (N/A) ESOPHAGOSCOPY (Right)  1 stable 2 will get venous duplex, she has been on sq lovenox but doesn't ambulate much 3 IV abx have been transitioned to PO augmentin 4 cont soft diet  LOS: 7 days    Milayah Krell E 10/09/2016

## 2016-10-10 LAB — CBC
HEMATOCRIT: 34.1 % — AB (ref 36.0–46.0)
Hemoglobin: 11 g/dL — ABNORMAL LOW (ref 12.0–15.0)
MCH: 25.9 pg — AB (ref 26.0–34.0)
MCHC: 32.3 g/dL (ref 30.0–36.0)
MCV: 80.2 fL (ref 78.0–100.0)
PLATELETS: 446 10*3/uL — AB (ref 150–400)
RBC: 4.25 MIL/uL (ref 3.87–5.11)
RDW: 16.7 % — ABNORMAL HIGH (ref 11.5–15.5)
WBC: 23.5 10*3/uL — ABNORMAL HIGH (ref 4.0–10.5)

## 2016-10-10 MED ORDER — AMLODIPINE BESYLATE 5 MG PO TABS
5.0000 mg | ORAL_TABLET | Freq: Every day | ORAL | Status: DC
  Administered 2016-10-10: 5 mg via ORAL
  Filled 2016-10-10: qty 1

## 2016-10-10 MED ORDER — PANTOPRAZOLE SODIUM 40 MG PO TBEC
40.0000 mg | DELAYED_RELEASE_TABLET | Freq: Every day | ORAL | Status: DC
  Administered 2016-10-10 – 2016-10-11 (×2): 40 mg via ORAL
  Filled 2016-10-10 (×2): qty 1

## 2016-10-10 MED ORDER — LACTULOSE 10 GM/15ML PO SOLN: 30.0000 g | Freq: Every day | ORAL | Status: DC | PRN

## 2016-10-10 NOTE — Progress Notes (Signed)
BP  not well controlled overnight even with the use of PRN medications SBP 160-190. Heart rate does respond to the IV metoprolol did drop to 58 patient asymptomatic. I will continue to monitor.

## 2016-10-10 NOTE — Discharge Summary (Signed)
Physician Discharge Summary  Patient ID: Catherine Erickson MRN: [id number] DOB/AGE: 59-Nov-1957 46 y.o.  Admit date: 10/02/2016 Discharge date: 10/12/2016  Admission Diagnoses:pneumomediastinum  Discharge Diagnoses:  Active Problems:   Esophageal perforation  Patient Active Problem List   Diagnosis Date Noted  . Esophageal perforation 10/02/2016  . INSOMNIA 04/22/2009  . HYPERLIPIDEMIA 06/12/2007  . ALLERGIC RHINITIS 06/12/2007  . HYPERTENSION 04/19/2001   HPI: 61 yo woman with a past history significant for lye ingestion at age 68 and tracheostomy. Also has a history of hypertension and hyperlipidemia. Developed sudden onset of chest pain more on left side yesterday. Also noted fever, back pain, and shortness of breath. No preceding emesis. Went to ED. Noted to be tachycardic and appeared acutely ill. CT showed an esophageal perforation. Started on vanco and zosyn and resuscitated.  Currently still c/o chest and back pain but is smiling and in no acute distress.  Denies difficulty swallowing prior to onset of  Pain.   Discharged Condition: good  Hospital Course: The patient was admitted by gastroenterology and taken for upper endoscopy where they found a benign appearing esophageal stenosis which was dilated. She was made nothing by mouth and CT surgical consult was also obtained with Dr. Roxan Erickson. Due to the possibility of esophageal perforation with evidence of a pneumomediastinum he recommended bronchoscopy which was done on 10/02/2016. Findings were unremarkable. She was started on a course of slow progression of his advanced in diet which she has tolerated. Currently she is on a regular diet and tolerating it well. Due to her multiple chronic medical issues she has been slow to progress overall but is making steady progress in her physical recovery. She has had some significant hypertension which has been treated with intravenous hydralazine and beta blocker and she has been  transitioned back to her oral antihypertensives. She has had a leukocytosis which persists. She is initially treated with so 7 and vancomycin intravenously but has been transitioned to oral Augmentin. Cultures have been unremarkable. She will receive a total of 14 days of Augmentin. Overall she is felt to be stable at this time for discharge.  Consults: gastroenterology  Significant Diagnostic Studies: venous duplex   Treatments: surgery:  PHYSICIAN:  Catherine Erickson, M.D.DATE OF BIRTH:  Dec 08, 1955  DATE OF PROCEDURE:  10/02/2016 DATE OF DISCHARGE:                              OPERATIVE REPORT   PREOPERATIVE DIAGNOSIS:  Pneumomediastinum, suspect esophageal perforation.  POSTOPERATIVE DIAGNOSIS:  Pneumomediastinum.  PROCEDURE:  Flexible fiberoptic bronchoscopy.  SURGEON:  Catherine Erickson, M.D.  ASSISTANT:  None.  ANESTHESIA:  General.  FINDINGS:  Normal endobronchial anatomy, some edema of the mucosa and blunting of the carinas, no endobronchial lesions seen, minimal Secretions.   Catherine Erickson Procedure Date : 10/02/2016 MRN: [id number] Attending MD: Carol Ada , MD Date of Birth: [birth year] CSN: [id number] Age: 71 Admit Type: Inpatient Procedure:                Upper GI endoscopy Indications:              Abnormal CT of the GI tract Providers:                Carol Ada, MD, Vista Lawman, RN, Alphonzo Grieve  Leighton Roach, Technician, Marcene Duos, Technician Referring MD:              Medicines:                General Anesthesia Complications:            No immediate complications. Estimated Blood Loss:     Estimated blood loss: none. Procedure:                Pre-Anesthesia Assessment:                           - Prior to the procedure, a History and Physical                            was performed, and patient medications and                            allergies were reviewed. The  patient's tolerance of                            previous anesthesia was also reviewed. The risks                            and benefits of the procedure and the sedation                            options and risks were discussed with the patient.                            All questions were answered, and informed consent                            was obtained. Prior Anticoagulants: The patient has                            taken no previous anticoagulant or antiplatelet                            agents. ASA Grade Assessment: III - A patient with                            severe systemic disease. After reviewing the risks                            and benefits, the patient was deemed in                            satisfactory condition to undergo the procedure.                           - Sedation was administered by an anesthesia                            professional. General anesthesia was attained.  After obtaining informed consent, the endoscope was                            passed under direct vision. Throughout the                            procedure, the patient's blood pressure, pulse, and                            oxygen saturations were monitored continuously. The                            was introduced through the mouth, and advanced to                            the second part of duodenum. The upper GI endoscopy                            was technically difficult and complex. The patient                            tolerated the procedure well. Scope In: Scope Out: Findings:      One severe benign-appearing, intrinsic stenosis was found. This measured       7 mm (inner diameter) x 1.5 cm (in length) and was traversed after       downsizing scope and dilating. A guide wire was placed, then the scope       was withdrawn. Using the wire as a guide, dilation with a 6-7-8 mm       balloon and an 11-25-08 mm balloon dilator was performed to 10  mm. The       dilation site was examined and showed moderate improvement in luminal       narrowing. Estimated blood loss: none.      The stomach was normal.      The examined duodenum was normal.      Approximately 2-3 cm distal to the UES a very tight stricture was       identified. The adult endoscope was not able to traverse the area with       moderate pressure. The pediatric endoscope was then used, but it was       still too large for the stricture. Using a through the scope balloon       dilator with the adult upper endoscope the stricture was dilated up       sequentially from 6-10 mm. The pediatric endoscope was again used and       there was some resistance, but with moderate pressure the endoscope was       advanced through the stricture. There was no evidence of any esophageal       masses, ulcerations, or inflammation. No evidence of any esophageal       perforation. The Z-line was sharp and intact. Even with dilation up to       10 mm it was still difficult to push the endoscope through the entire       upper GI tract. Even withdrawing the endoscope was difficult. Impression:               -  Benign-appearing esophageal stenosis. Dilated.                           - Normal stomach.                           - Normal examined duodenum.                           - No specimens collected. Moderate Sedation:      None Recommendation:           - Return patient to hospital ward for ongoing care.                           - NPO.                           - Continue present medications.                           - Repeat dilation in the near future.                           - Further management per CVTS. Procedure Code(s):        --- Professional ---                           [phone], Esophagogastroduodenoscopy, flexible,                            transoral; with insertion of guide wire followed by                            passage of dilator(s) through esophagus over guide                             wire Diagnosis Code(s):        --- Professional ---                           K22.2, Esophageal obstruction                           R93.3, Abnormal findings on diagnostic imaging of                            other parts of digestive tract CPT copyright 2016 [employer]. All rights reserved. The codes documented in this report are preliminary and upon coder review may  be revised to meet current compliance requirements. Carol Ada, MD Carol Ada, MD 10/02/2016 11:57:31 AM This report has been signed electronically. Number of Addenda: 0  Discharge Exam: Blood pressure (!) 162/70, pulse 88, temperature 98.3 F (36.8 C), temperature source Oral, resp. rate 20, height 5\' 3"  (1.6 m), weight 243 lb 8 oz (110.5 kg), SpO2 94 %.  General appearance: alert, cooperative and no distress Heart: regular rate and rhythm Lungs: clear to auscultation bilaterally Abdomen: soft,  nontender Extremities: + RLE edema  Disposition:  Allergies as of 10/12/2016   No Known Allergies     Medication List    STOP taking these medications   ibuprofen 200 MG tablet Commonly known as:  ADVIL,MOTRIN     TAKE these medications   amLODipine 5 MG tablet Commonly known as:  NORVASC Take 5 mg by mouth daily after breakfast.   amoxicillin-clavulanate 875-125 MG tablet Commonly known as:  AUGMENTIN Take 1 tablet by mouth every 12 (twelve) hours.   lisinopril-hydrochlorothiazide 20-25 MG tablet Commonly known as:  PRINZIDE,ZESTORETIC Take 1 tablet by mouth daily after breakfast.   traMADol 50 MG tablet Commonly known as:  ULTRAM Take 1 tablet (50 mg total) by mouth every 12 (twelve) hours as needed.      Follow-up Information    Melrose Nakayama, MD Follow up.   Specialty:  Cardiothoracic Surgery Why:  office will contact you Contact information: [address] [place] [address] [phone]            Signed: [name] 10/12/2016, 9:58 AM

## 2016-10-10 NOTE — Progress Notes (Addendum)
      [address]       [place] [address]             [phone]      8 Days Post-Op Procedure(s) (LRB): VIDEO BRONCHOSCOPY (N/A) ESOPHAGOSCOPY (Right) Subjective: No DVT by duplex, BP poorly controlled on hydralazine and lopressor IV  Objective: Vital signs in last 24 hours: Temp:  [97.7 F (36.5 C)-98.4 F (36.9 C)] 98 F (36.7 C) (06/24 0847) Pulse Rate:  [61-85] 82 (06/24 0847) Cardiac Rhythm: Normal sinus rhythm (06/23 1950) Resp:  [14-30] 19 (06/24 0847) BP: (152-199)/(65-95) 172/81 (06/24 0847) SpO2:  [91 %-100 %] 98 % (06/24 0847) FiO2 (%):  [30 %] 30 % (06/24 0822)  Hemodynamic parameters for last 24 hours:    Intake/Output from previous day: 06/23 0701 - 06/24 0700 In: 823.5 [P.O.:720; I.V.:53.5; IV Piggyback:50] Out: 750 [Urine:750] Intake/Output this shift: No intake/output data recorded.  General appearance: alert, cooperative and no distress Heart: regular rate and rhythm Lungs: clear to auscultation bilaterally Abdomen: benign Extremities: + RLE edema Wound: n/a  Lab Results:  Recent Labs  10/08/16 0524 10/10/16 0637  WBC 22.8* 23.5*  HGB 10.8* 11.0*  HCT 32.7* 34.1*  PLT 453* 446*   BMET: No results for input(s): NA, K, CL, CO2, GLUCOSE, BUN, CREATININE, CALCIUM in the last 72 hours.  PT/INR: No results for input(s): LABPROT, INR in the last 72 hours. ABG No results found for: PHART, HCO3, TCO2, ACIDBASEDEF, O2SAT CBG (last 3)  No results for input(s): GLUCAP in the last 72 hours.  Meds Scheduled Meds: . amoxicillin-clavulanate  1 tablet Oral Q12H  . chlorhexidine  15 mL Mouth Rinse BID  . Chlorhexidine Gluconate Cloth  6 each Topical Daily  . enoxaparin (LOVENOX) injection  40 mg Subcutaneous Q24H  . levalbuterol  0.63 mg Nebulization TID  . mouth rinse  15 mL Mouth Rinse q12n4p  . metoprolol tartrate  5 mg Intravenous Q6H  . pantoprazole (PROTONIX) IV  40 mg Intravenous Q24H  . sodium chloride flush  10-40  mL Intracatheter Q12H   Continuous Infusions: . 0.45 % NaCl with KCl 20 mEq / L 10 mL/hr at 10/08/16 2338   PRN Meds:.Glycerin (Adult), guaiFENesin-dextromethorphan, hydrALAZINE, metoprolol tartrate, morphine injection, sodium chloride flush, zolpidem  Xrays No results found.  Assessment/Plan: S/P Procedure(s) (LRB): VIDEO BRONCHOSCOPY (N/A) ESOPHAGOSCOPY (Right)  1 feeling better today 2 restart home po antihypertensives 3 no DVT by venous duplex 4 tolerating regular diet 5 leukocytosis persists with absolute neutrophiles elevated- on augmentin now 6 will add lactulose with no recent BM 7 increase ambulation as able 8 poss home in am  LOS: 8 days    GOLD,WAYNE E 10/10/2016 Agree with plan to continue antibiotic coverage with Augmentin Agree with above assessment and plan P Prescott Gum MD

## 2016-10-10 NOTE — Discharge Instructions (Signed)
Flexible Bronchoscopy, Care After This sheet gives you information about how to care for yourself after your procedure. Your health care provider may also give you more specific instructions. If you have problems or questions, contact your health care provider. What can I expect after the procedure? After the procedure, it is common to have the following symptoms for 24-48 hours: Pneumomediastinum Pneumomediastinum is the presence of air in the area between the lungs and behind the breastbone (mediastinum). Mild cases of this condition may not cause problems, but severe cases can interfere with the normal functions of the heart and lungs. What are the causes? This condition happens when air leaks out of the lungs, airways, or intestines into the mediastinum. This condition may be caused by: Childbirth. An injury to the chest, lung, intestine, esophagus, or abdomen. Asthma. Rapid ascent during scuba diving. Use of a breathing machine (ventilator). Inhaling or ingesting certain drugs or chemicals. An infection in the face, neck, chest, or abdomen. Extreme strain during coughing or vomiting. Breathing an object into the airway.  This condition can also occur on its own. What are the signs or symptoms? Symptoms of this condition include: Chest pain. The pain may run into your neck, shoulder, back, or arms. Increased pain when you move, swallow, or take a deep breath. Problems swallowing. Problems speaking. Changes in your voice. Shortness of breath. Fever. Throat or jaw pain.  In some cases, especially in cases where the condition occurred on its own, there are no symptoms. How is this diagnosed? This condition may be diagnosed with: A description of your symptoms. A physical exam. Imaging tests, such as a chest X-ray or CT scan.  How is this treated? Treatment depends on how severe the condition is and if there are complications. If your condition is mild, you may not need  treatment. Your body may slowly reabsorb the air in your mediastinum. You will stay in the hospital for observation and get medicine for pain if you have pain. More severe cases may involve treating the underlying cause. If the air starts to put pressure on your heart or lungs, you may have one or more of these procedures: Needle aspiration. In this procedure, a needle is used to remove trapped air. Chest tube placement. This may be done if your lung collapses. Surgery. This may be done to repair a hole in the intestine or esophagus.  Follow these instructions at home: Until your health care provider says it is okay, avoid: Air travel. Scuba diving. High altitudes. Hard physical work. Exercise. Do not use any tobacco products, such as cigarettes, chewing tobacco, and e-cigarettes. If you need help quitting, ask your health care provider. Do not use illegal drugs. Take over-the-counter and prescription medicines only as told by your health care provider. Contact a health care provider if: You have a fever. Get help right away if: You have worsening pain in your chest, neck, jaw, or arms. You have trouble breathing. You have new problems with speaking or swallowing. This information is not intended to replace advice given to you by your health care provider. Make sure you discuss any questions you have with your health care provider. Document Released: 03/18/2008 Document Revised: 05/06/2015 Document Reviewed: 01/31/2015 Elsevier Interactive Patient Education  2018 [employer].   A cough that is worse than it was before the procedure.  A low-grade fever.  A sore throat or hoarse voice.  Small streaks of blood in the mucus from your lungs (sputum), if tissue samples were  removed (biopsy).  Follow these instructions at home: Eating and drinking  Do not eat or drink anything (including water) for 2 hours after your procedure, or until your numbing medicine (local anesthetic) has  worn off. Having a numb throat increases your risk of burning yourself or choking.  After your numbness is gone and your cough and gag reflexes have returned, you may start eating only soft foods and slowly drinking liquids.  The day after the procedure, return to your normal diet. Driving  Do not drive for 24 hours if you were given a medicine to help you relax (sedative).  Do not drive or use heavy machinery while taking prescription pain medicine. General instructions  Take over-the-counter and prescription medicines only as told by your health care provider.  Return to your normal activities as told by your health care provider. Ask your health care provider what activities are safe for you.  Do not use any products that contain nicotine or tobacco, such as cigarettes and e-cigarettes. If you need help quitting, ask your health care provider.  Keep all follow-up visits as told by your health care provider. This is important, especially if you had a biopsy taken. Get help right away if:  You have shortness of breath that gets worse.  You become light-headed or feel like you might faint.  You have chest pain.  You cough up more than a small amount of blood.  The amount of blood you cough up increases. Summary  Common symptoms in the 24-48 hours following a flexible bronchoscopy include cough, low-grade fever, sore throat or hoarse voice, and blood-streaked mucus from the lungs (if you had a biopsy).  Do not eat or drink anything (including water) for 2 hours after your procedure, or until your local anesthetic has worn off. You can return to your normal diet the day after the procedure.  Get help right away if you develop worsening shortness of breath, have chest pain, become light-headed, or cough up more than a small amount of blood. This information is not intended to replace advice given to you by your health care provider. Make sure you discuss any questions you have with  your health care provider. Document Released: 10/23/2004 Document Revised: 04/23/2016 Document Reviewed: 04/23/2016 Elsevier Interactive Patient Education  2017 [employer].

## 2016-10-11 MED ORDER — LISINOPRIL 10 MG PO TABS
10.0000 mg | ORAL_TABLET | Freq: Every day | ORAL | Status: DC
  Administered 2016-10-11 – 2016-10-12 (×2): 10 mg via ORAL
  Filled 2016-10-11 (×2): qty 1

## 2016-10-11 MED ORDER — AMLODIPINE BESYLATE 10 MG PO TABS
10.0000 mg | ORAL_TABLET | Freq: Every day | ORAL | Status: DC
  Administered 2016-10-11 – 2016-10-12 (×2): 10 mg via ORAL
  Filled 2016-10-11 (×2): qty 1

## 2016-10-11 MED ORDER — HYDROCHLOROTHIAZIDE 25 MG PO TABS
25.0000 mg | ORAL_TABLET | Freq: Every day | ORAL | Status: DC
  Administered 2016-10-11 – 2016-10-12 (×2): 25 mg via ORAL
  Filled 2016-10-11 (×2): qty 1

## 2016-10-11 NOTE — Progress Notes (Addendum)
      [address]       [place] [address]             [phone]      9 Days Post-Op Procedure(s) (LRB): VIDEO BRONCHOSCOPY (N/A) ESOPHAGOSCOPY (Right) Subjective: The patient wants to go home today.   Objective: Vital signs in last 24 hours: Temp:  [98 F (36.7 C)-98.6 F (37 C)] 98.2 F (36.8 C) (06/25 0346) Pulse Rate:  [70-95] 80 (06/25 5597) Cardiac Rhythm: Normal sinus rhythm (06/25 0000) Resp:  [16-64] 28 (06/25 0633) BP: (149-199)/(67-95) 179/67 (06/25 0633) SpO2:  [93 %-99 %] 97 % (06/25 0633) FiO2 (%):  [30 %] 30 % (06/25 0425)    Intake/Output from previous day: 06/24 0701 - 06/25 0700 In: 689.8 [P.O.:240; I.V.:449.8] Out: -  Intake/Output this shift: No intake/output data recorded.  General appearance: alert, cooperative and no distress Heart: regular rate and rhythm, S1, S2 normal, no murmur, click, rub or gallop Lungs: clear to auscultation bilaterally Abdomen: soft, non-tender; bowel sounds normal; no masses,  no organomegaly Extremities: extremities normal, atraumatic, no cyanosis or edema  Lab Results:  Recent Labs  10/10/16 0637  WBC 23.5*  HGB 11.0*  HCT 34.1*  PLT 446*   BMET: No results for input(s): NA, K, CL, CO2, GLUCOSE, BUN, CREATININE, CALCIUM in the last 72 hours.  PT/INR: No results for input(s): LABPROT, INR in the last 72 hours. ABG No results found for: PHART, HCO3, TCO2, ACIDBASEDEF, O2SAT CBG (last 3)  No results for input(s): GLUCAP in the last 72 hours.  Assessment/Plan: S/P Procedure(s) (LRB): VIDEO BRONCHOSCOPY (N/A) ESOPHAGOSCOPY (Right)  1. CV-blood pressure remains uncontrolled. Increase Norvasc to 10mg  daily. She was on home lisinopril-hydochlorothiazide but has not been restarted? Also on Lopressor and Hydralazine PRN 2. Pulm-tolerating room air. Encourage incentive spirometry  3. Renal- last creatinine 1.01 and K 3.8.  4. Tolerating diet 5. Leukocytosis- on Augmentin 6. Increase  ambulation 7. On multiple stool softeners   Plan: work on improving blood pressure control. Ambulate. Likely home tomorrow after titration of oral BP medication.     LOS: 9 days    Catherine Erickson 10/11/2016  po sss home tomorrow  If bp stable , will get follow up chest xray Check wbc in am I have seen and examined Catherine Erickson and agree with the above assessment  and plan.  Catherine Isaac MD [facility] [phone] Office [phone] 10/11/2016 3:25 PM

## 2016-10-12 ENCOUNTER — Inpatient Hospital Stay (HOSPITAL_COMMUNITY): Payer: Medicaid Other

## 2016-10-12 LAB — BASIC METABOLIC PANEL
ANION GAP: 9 (ref 5–15)
BUN: 10 mg/dL (ref 6–20)
CALCIUM: 8.7 mg/dL — AB (ref 8.9–10.3)
CO2: 28 mmol/L (ref 22–32)
CREATININE: 1.2 mg/dL — AB (ref 0.44–1.00)
Chloride: 100 mmol/L — ABNORMAL LOW (ref 101–111)
GFR calc Af Amer: 57 mL/min — ABNORMAL LOW (ref >60)
GFR calc non Af Amer: 49 mL/min — ABNORMAL LOW (ref >60)
GLUCOSE: 117 mg/dL — AB (ref 65–99)
Potassium: 2.7 mmol/L — CL (ref 3.5–5.1)
Sodium: 137 mmol/L (ref 135–145)

## 2016-10-12 LAB — CBC
HCT: 33.1 % — ABNORMAL LOW (ref 36.0–46.0)
Hemoglobin: 10.8 g/dL — ABNORMAL LOW (ref 12.0–15.0)
MCH: 26.3 pg (ref 26.0–34.0)
MCHC: 32.6 g/dL (ref 30.0–36.0)
MCV: 80.7 fL (ref 78.0–100.0)
Platelets: 446 10*3/uL — ABNORMAL HIGH (ref 150–400)
RBC: 4.1 MIL/uL (ref 3.87–5.11)
RDW: 17 % — ABNORMAL HIGH (ref 11.5–15.5)
WBC: 22.4 10*3/uL — ABNORMAL HIGH (ref 4.0–10.5)

## 2016-10-12 MED ORDER — TRAMADOL HCL 50 MG PO TABS: 50.0000 mg | ORAL_TABLET | Freq: Two times a day (BID) | ORAL | 0 refills | Status: DC | PRN

## 2016-10-12 MED ORDER — AMOXICILLIN-POT CLAVULANATE 875-125 MG PO TABS: 1.0000 | ORAL_TABLET | Freq: Two times a day (BID) | ORAL | 0 refills | Status: DC

## 2016-10-12 MED ORDER — POTASSIUM CHLORIDE CRYS ER 20 MEQ PO TBCR
20.0000 meq | EXTENDED_RELEASE_TABLET | Freq: Two times a day (BID) | ORAL | Status: AC
  Administered 2016-10-12 (×2): 20 meq via ORAL
  Filled 2016-10-12 (×2): qty 1

## 2016-10-12 MED ORDER — LISINOPRIL 20 MG PO TABS: 20.0000 mg | ORAL_TABLET | Freq: Every day | ORAL | Status: DC

## 2016-10-12 MED ORDER — POTASSIUM CHLORIDE CRYS ER 20 MEQ PO TBCR
40.0000 meq | EXTENDED_RELEASE_TABLET | Freq: Two times a day (BID) | ORAL | Status: DC
  Administered 2016-10-12: 40 meq via ORAL
  Filled 2016-10-12: qty 2

## 2016-10-12 NOTE — Progress Notes (Signed)
      [address]       [place] [address]             [phone]      10 Days Post-Op Procedure(s) (LRB): VIDEO BRONCHOSCOPY (N/A) ESOPHAGOSCOPY (Right) Subjective: Feels well, BP better controlled, getting K+ replacement  Objective: Vital signs in last 24 hours: Temp:  [98.2 F (36.8 C)-98.6 F (37 C)] 98.3 F (36.8 C) (06/26 0734) Pulse Rate:  [71-104] 83 (06/26 0734) Cardiac Rhythm: Normal sinus rhythm (06/26 0354) Resp:  [5-31] 30 (06/26 0734) BP: (121-174)/(52-76) 162/70 (06/26 0734) SpO2:  [93 %-96 %] 95 % (06/26 0734) FiO2 (%):  [28 %-30 %] 28 % (06/26 0500)  Hemodynamic parameters for last 24 hours:    Intake/Output from previous day: 06/25 0701 - 06/26 0700 In: 980 [P.O.:840; I.V.:140] Out: -  Intake/Output this shift: No intake/output data recorded.  General appearance: alert, cooperative and no distress Heart: regular rate and rhythm Lungs: clear to auscultation bilaterally Abdomen: soft,  nontender Extremities: + RLE edema  Lab Results:  Recent Labs  10/10/16 0637 10/12/16 0324  WBC 23.5* 22.4*  HGB 11.0* 10.8*  HCT 34.1* 33.1*  PLT 446* 446*   BMET:  Recent Labs  10/12/16 0324  NA 137  K 2.7*  CL 100*  CO2 28  GLUCOSE 117*  BUN 10  CREATININE 1.20*  CALCIUM 8.7*    PT/INR: No results for input(s): LABPROT, INR in the last 72 hours. ABG No results found for: PHART, HCO3, TCO2, ACIDBASEDEF, O2SAT CBG (last 3)  No results for input(s): GLUCAP in the last 72 hours.  Meds Scheduled Meds: . amLODipine  10 mg Oral QPC breakfast  . amoxicillin-clavulanate  1 tablet Oral Q12H  . chlorhexidine  15 mL Mouth Rinse BID  . Chlorhexidine Gluconate Cloth  6 each Topical Daily  . enoxaparin (LOVENOX) injection  40 mg Subcutaneous Q24H  . hydrochlorothiazide  25 mg Oral Daily  . levalbuterol  0.63 mg Nebulization TID  . lisinopril  10 mg Oral Daily  . mouth rinse  15 mL Mouth Rinse q12n4p  . metoprolol tartrate  5 mg  Intravenous Q6H  . pantoprazole  40 mg Oral Daily  . sodium chloride flush  10-40 mL Intracatheter Q12H   Continuous Infusions: . 0.45 % NaCl with KCl 20 mEq / L 10 mL/hr at 10/12/16 0041   PRN Meds:.Glycerin (Adult), guaiFENesin-dextromethorphan, hydrALAZINE, lactulose, metoprolol tartrate, morphine injection, sodium chloride flush, zolpidem  Xrays No results found.  Assessment/Plan: S/P Procedure(s) (LRB): VIDEO BRONCHOSCOPY (N/A) ESOPHAGOSCOPY (Right)  1 doing well , cont to replace K+ this morning, conts to tolerate diet 2 CXR pending 3 increase lisinopril to home dose 4 probable home later today   LOS: 10 days    Billey Wojciak E 10/12/2016

## 2016-10-12 NOTE — Care Management Note (Signed)
Case Management Note  Patient Details  Name: Catherine Erickson MRN: [id number] Date of Birth: 11/25/1955  Subjective/Objective:  Esophageal Perforation                  Action/Plan: Discharge Planning:  NCM spoke to pt and lives at home with daughter, Lyda Jester # [phone]. States they did not have a supplier for trach supplies. Contacted [facility] and unable to do start of care for today. Contacted [facility] for oxygen and trach supplies for scheduled dc home today.     Expected Discharge Date:  10/12/16               Expected Discharge Plan:  Home/Self Care  In-House Referral:  NA  Discharge planning Services  CM Consult, Medication Assistance  Post Acute Care Choice:  NA Choice offered to:  NA  DME Arranged:  Trach supplies, Oxygen DME Agency:  [facility]  HH Arranged:  NA HH Agency:  NA  Status of Service:  Completed, signed off  If discussed at [place] of Stay Meetings, dates discussed:    Additional Comments:  Erenest Rasher, RN 10/12/2016, 12:01 PM

## 2016-10-12 NOTE — Progress Notes (Signed)
Discharge note. Educated and reviewed all discharge instructions, follow up appointments, Rxs given, all medications reviewed. Instructed patient and daughter about oxygen and getting the home oxygen set up. Portable oxygen tank already at the bedside. Answered all questions that patient had. Patient will be able to discharge shortly.

## 2016-10-12 NOTE — Progress Notes (Signed)
RT Note: Patient was already discharged and off the unit by the time RT came to do her 1600 trach assessment.

## 2016-10-12 NOTE — Progress Notes (Signed)
SATURATION QUALIFICATIONS: (This note is used to comply with regulatory documentation for home oxygen)  Patient Saturations on Room Air at Rest =92 %  Patient Saturations on Room Air while Ambulating = 87%  Patient Saturations on 8 Liters of oxygen while Ambulating = 92%  Please briefly explain why patient needs home oxygen: Patient unable to ambulate without desaturation while on room air.

## 2016-10-15 ENCOUNTER — Emergency Department (HOSPITAL_COMMUNITY): Payer: Medicaid Other

## 2016-10-15 ENCOUNTER — Emergency Department (HOSPITAL_COMMUNITY): Payer: Medicaid Other | Admitting: Anesthesiology

## 2016-10-15 ENCOUNTER — Inpatient Hospital Stay (HOSPITAL_COMMUNITY)
Admission: EM | Admit: 2016-10-15 | Discharge: 2016-10-26 | DRG: 163 | Disposition: A | Discharge status: Home Health | Payer: Medicaid Other | Attending: Surgery | Admitting: Surgery

## 2016-10-15 ENCOUNTER — Inpatient Hospital Stay (HOSPITAL_COMMUNITY): Payer: Medicaid Other

## 2016-10-15 ENCOUNTER — Encounter (HOSPITAL_COMMUNITY)
Admission: EM | Disposition: A | Discharge status: Home Health | Payer: Self-pay | Source: Home / Self Care | Attending: Surgery

## 2016-10-15 ENCOUNTER — Encounter (HOSPITAL_COMMUNITY): Payer: Self-pay

## 2016-10-15 DIAGNOSIS — J853 Abscess of mediastinum: Secondary | ICD-10-CM | POA: Diagnosis present

## 2016-10-15 DIAGNOSIS — K222 Esophageal obstruction: Secondary | ICD-10-CM | POA: Diagnosis present

## 2016-10-15 DIAGNOSIS — E669 Obesity, unspecified: Secondary | ICD-10-CM | POA: Diagnosis present

## 2016-10-15 DIAGNOSIS — J90 Pleural effusion, not elsewhere classified: Secondary | ICD-10-CM | POA: Diagnosis present

## 2016-10-15 DIAGNOSIS — Z9689 Presence of other specified functional implants: Secondary | ICD-10-CM | POA: Diagnosis not present

## 2016-10-15 DIAGNOSIS — J9811 Atelectasis: Secondary | ICD-10-CM | POA: Diagnosis present

## 2016-10-15 DIAGNOSIS — Z87891 Personal history of nicotine dependence: Secondary | ICD-10-CM | POA: Diagnosis not present

## 2016-10-15 DIAGNOSIS — Z930 Tracheostomy status: Secondary | ICD-10-CM

## 2016-10-15 DIAGNOSIS — J982 Interstitial emphysema: Secondary | ICD-10-CM | POA: Diagnosis present

## 2016-10-15 DIAGNOSIS — Z95828 Presence of other vascular implants and grafts: Secondary | ICD-10-CM | POA: Diagnosis not present

## 2016-10-15 DIAGNOSIS — R079 Chest pain, unspecified: Secondary | ICD-10-CM | POA: Diagnosis present

## 2016-10-15 DIAGNOSIS — Z79899 Other long term (current) drug therapy: Secondary | ICD-10-CM | POA: Diagnosis not present

## 2016-10-15 DIAGNOSIS — D62 Acute posthemorrhagic anemia: Secondary | ICD-10-CM | POA: Diagnosis not present

## 2016-10-15 DIAGNOSIS — E785 Hyperlipidemia, unspecified: Secondary | ICD-10-CM | POA: Diagnosis present

## 2016-10-15 DIAGNOSIS — K223 Perforation of esophagus: Secondary | ICD-10-CM | POA: Diagnosis present

## 2016-10-15 DIAGNOSIS — I871 Compression of vein: Secondary | ICD-10-CM | POA: Diagnosis present

## 2016-10-15 DIAGNOSIS — Z9889 Other specified postprocedural states: Secondary | ICD-10-CM

## 2016-10-15 DIAGNOSIS — Z6836 Body mass index (BMI) 36.0-36.9, adult: Secondary | ICD-10-CM | POA: Diagnosis not present

## 2016-10-15 DIAGNOSIS — B954 Other streptococcus as the cause of diseases classified elsewhere: Secondary | ICD-10-CM | POA: Diagnosis not present

## 2016-10-15 DIAGNOSIS — I10 Essential (primary) hypertension: Secondary | ICD-10-CM | POA: Diagnosis present

## 2016-10-15 DIAGNOSIS — J869 Pyothorax without fistula: Secondary | ICD-10-CM | POA: Diagnosis present

## 2016-10-15 DIAGNOSIS — R0602 Shortness of breath: Secondary | ICD-10-CM

## 2016-10-15 HISTORY — PX: VIDEO ASSISTED THORACOSCOPY (VATS)/EMPYEMA: SHX6172

## 2016-10-15 LAB — CBC WITH DIFFERENTIAL/PLATELET
Basophils Absolute: 0.1 10*3/uL (ref 0.0–0.1)
Basophils Relative: 0 %
Eosinophils Absolute: 0.2 10*3/uL (ref 0.0–0.7)
Eosinophils Relative: 1 %
HCT: 35.6 % — ABNORMAL LOW (ref 36.0–46.0)
Hemoglobin: 12.2 g/dL (ref 12.0–15.0)
Lymphocytes Relative: 12 %
Lymphs Abs: 3 10*3/uL (ref 0.7–4.0)
MCH: 27.5 pg (ref 26.0–34.0)
MCHC: 34.3 g/dL (ref 30.0–36.0)
MCV: 80.2 fL (ref 78.0–100.0)
Monocytes Absolute: 2.4 10*3/uL — ABNORMAL HIGH (ref 0.1–1.0)
Monocytes Relative: 10 %
Neutro Abs: 18.9 10*3/uL — ABNORMAL HIGH (ref 1.7–7.7)
Neutrophils Relative %: 77 %
Platelets: 566 10*3/uL — ABNORMAL HIGH (ref 150–400)
RBC: 4.44 MIL/uL (ref 3.87–5.11)
RDW: 17 % — ABNORMAL HIGH (ref 11.5–15.5)
WBC: 24.6 10*3/uL — ABNORMAL HIGH (ref 4.0–10.5)

## 2016-10-15 LAB — POCT I-STAT 3, ART BLOOD GAS (G3+)
Acid-Base Excess: 5 mmol/L — ABNORMAL HIGH (ref 0.0–2.0)
Bicarbonate: 31.2 mmol/L — ABNORMAL HIGH (ref 20.0–28.0)
O2 SAT: 100 %
PCO2 ART: 51.7 mmHg — AB (ref 32.0–48.0)
PH ART: 7.388 (ref 7.350–7.450)
Patient temperature: 98.4
TCO2: 33 mmol/L (ref 0–100)
pO2, Arterial: 227 mmHg — ABNORMAL HIGH (ref 83.0–108.0)

## 2016-10-15 LAB — BASIC METABOLIC PANEL
Anion gap: 14 (ref 5–15)
BUN: 7 mg/dL (ref 6–20)
CO2: 27 mmol/L (ref 22–32)
Calcium: 9.3 mg/dL (ref 8.9–10.3)
Chloride: 90 mmol/L — ABNORMAL LOW (ref 101–111)
Creatinine, Ser: 1.16 mg/dL — ABNORMAL HIGH (ref 0.44–1.00)
GFR calc Af Amer: 59 mL/min — ABNORMAL LOW (ref >60)
GFR calc non Af Amer: 51 mL/min — ABNORMAL LOW (ref >60)
Glucose, Bld: 112 mg/dL — ABNORMAL HIGH (ref 65–99)
Potassium: 2.9 mmol/L — ABNORMAL LOW (ref 3.5–5.1)
Sodium: 131 mmol/L — ABNORMAL LOW (ref 135–145)

## 2016-10-15 LAB — I-STAT CG4 LACTIC ACID, ED: Lactic Acid, Venous: 1.6 mmol/L (ref 0.5–1.9)

## 2016-10-15 SURGERY — VIDEO ASSISTED THORACOSCOPY (VATS)/EMPYEMA
Anesthesia: General | Site: Chest | Laterality: Right

## 2016-10-15 MED ORDER — FENTANYL CITRATE (PF) 250 MCG/5ML IJ SOLN
INTRAMUSCULAR | Status: AC
  Filled 2016-10-15: qty 5

## 2016-10-15 MED ORDER — VANCOMYCIN HCL IN DEXTROSE 750-5 MG/150ML-% IV SOLN
750.0000 mg | Freq: Two times a day (BID) | INTRAVENOUS | Status: DC
  Filled 2016-10-15: qty 150

## 2016-10-15 MED ORDER — IOPAMIDOL (ISOVUE-370) INJECTION 76%
100.0000 mL | Freq: Once | INTRAVENOUS | Status: AC | PRN
  Administered 2016-10-15: 100 mL via INTRAVENOUS

## 2016-10-15 MED ORDER — MORPHINE SULFATE (PF) 4 MG/ML IV SOLN
4.0000 mg | Freq: Once | INTRAVENOUS | Status: AC
  Administered 2016-10-15: 4 mg via INTRAVENOUS
  Filled 2016-10-15: qty 1

## 2016-10-15 MED ORDER — ALBUMIN HUMAN 5 % IV SOLN
INTRAVENOUS | Status: DC | PRN
  Administered 2016-10-15 (×2): via INTRAVENOUS

## 2016-10-15 MED ORDER — FENTANYL CITRATE (PF) 100 MCG/2ML IJ SOLN
INTRAMUSCULAR | Status: DC | PRN
  Administered 2016-10-15: 50 ug via INTRAVENOUS
  Administered 2016-10-15: 150 ug via INTRAVENOUS
  Administered 2016-10-15 (×3): 100 ug via INTRAVENOUS

## 2016-10-15 MED ORDER — SODIUM CHLORIDE 0.9 % IV SOLN
0.4000 ug/kg/h | INTRAVENOUS | Status: DC
  Administered 2016-10-16: 0.6 ug/kg/h via INTRAVENOUS
  Administered 2016-10-16: 0.2 ug/kg/h via INTRAVENOUS
  Filled 2016-10-15 (×3): qty 2

## 2016-10-15 MED ORDER — ONDANSETRON HCL 4 MG/2ML IJ SOLN
INTRAMUSCULAR | Status: DC | PRN
  Administered 2016-10-15: 4 mg via INTRAVENOUS

## 2016-10-15 MED ORDER — OXYCODONE HCL 5 MG/5ML PO SOLN: 5.0000 mg | Freq: Once | ORAL | Status: DC | PRN

## 2016-10-15 MED ORDER — PIPERACILLIN-TAZOBACTAM 3.375 G IVPB 30 MIN
3.3750 g | Freq: Once | INTRAVENOUS | Status: AC
  Administered 2016-10-15: 3.375 g via INTRAVENOUS
  Filled 2016-10-15: qty 50

## 2016-10-15 MED ORDER — SODIUM CHLORIDE 0.9 % IV SOLN
2000.0000 mg | Freq: Once | INTRAVENOUS | Status: AC
  Administered 2016-10-15: 2000 mg via INTRAVENOUS
  Filled 2016-10-15: qty 2000

## 2016-10-15 MED ORDER — IOPAMIDOL (ISOVUE-300) INJECTION 61%
INTRAVENOUS | Status: AC
  Filled 2016-10-15: qty 75

## 2016-10-15 MED ORDER — MIDAZOLAM HCL 5 MG/5ML IJ SOLN
INTRAMUSCULAR | Status: DC | PRN
  Administered 2016-10-15 (×2): 2 mg via INTRAVENOUS

## 2016-10-15 MED ORDER — MIDAZOLAM HCL 2 MG/2ML IJ SOLN
INTRAMUSCULAR | Status: AC
  Filled 2016-10-15: qty 2

## 2016-10-15 MED ORDER — SODIUM CHLORIDE 0.9 % IV BOLUS (SEPSIS)
1000.0000 mL | Freq: Once | INTRAVENOUS | Status: AC
  Administered 2016-10-15: 1000 mL via INTRAVENOUS

## 2016-10-15 MED ORDER — SODIUM CHLORIDE 0.9 % IV SOLN: Freq: Once | INTRAVENOUS | Status: DC

## 2016-10-15 MED ORDER — INSULIN ASPART 100 UNIT/ML ~~LOC~~ SOLN
0.0000 [IU] | SUBCUTANEOUS | Status: DC
  Administered 2016-10-16 (×3): 2 [IU] via SUBCUTANEOUS
  Administered 2016-10-16: 4 [IU] via SUBCUTANEOUS
  Administered 2016-10-18 – 2016-10-19 (×3): 2 [IU] via SUBCUTANEOUS

## 2016-10-15 MED ORDER — LIDOCAINE HCL (CARDIAC) 20 MG/ML IV SOLN
INTRAVENOUS | Status: DC | PRN
  Administered 2016-10-15: 40 mg via INTRAVENOUS

## 2016-10-15 MED ORDER — OXYCODONE HCL 5 MG PO TABS: 5.0000 mg | ORAL_TABLET | Freq: Once | ORAL | Status: DC | PRN

## 2016-10-15 MED ORDER — PROPOFOL 10 MG/ML IV BOLUS
INTRAVENOUS | Status: DC | PRN
  Administered 2016-10-15: 120 mg via INTRAVENOUS

## 2016-10-15 MED ORDER — PIPERACILLIN-TAZOBACTAM 3.375 G IVPB
3.3750 g | Freq: Three times a day (TID) | INTRAVENOUS | Status: DC
  Administered 2016-10-16 – 2016-10-21 (×17): 3.375 g via INTRAVENOUS
  Filled 2016-10-15 (×18): qty 50

## 2016-10-15 MED ORDER — IPRATROPIUM-ALBUTEROL 0.5-2.5 (3) MG/3ML IN SOLN
3.0000 mL | Freq: Once | RESPIRATORY_TRACT | Status: AC
  Administered 2016-10-15: 3 mL via RESPIRATORY_TRACT
  Filled 2016-10-15: qty 3

## 2016-10-15 MED ORDER — 0.9 % SODIUM CHLORIDE (POUR BTL) OPTIME
TOPICAL | Status: DC | PRN
  Administered 2016-10-15: 2000 mL

## 2016-10-15 MED ORDER — LACTATED RINGERS IV SOLN
INTRAVENOUS | Status: DC | PRN
  Administered 2016-10-15 (×2): via INTRAVENOUS

## 2016-10-15 MED ORDER — ROCURONIUM BROMIDE 100 MG/10ML IV SOLN
INTRAVENOUS | Status: DC | PRN
  Administered 2016-10-15: 50 mg via INTRAVENOUS

## 2016-10-15 MED ORDER — POTASSIUM CHLORIDE 10 MEQ/50ML IV SOLN
10.0000 meq | Freq: Every day | INTRAVENOUS | Status: DC | PRN
  Administered 2016-10-19 – 2016-10-21 (×6): 10 meq via INTRAVENOUS
  Filled 2016-10-15 (×7): qty 50

## 2016-10-15 MED ORDER — ONDANSETRON HCL 4 MG/2ML IJ SOLN: 4.0000 mg | Freq: Once | INTRAMUSCULAR | Status: DC | PRN

## 2016-10-15 MED ORDER — PHENYLEPHRINE HCL 10 MG/ML IJ SOLN
INTRAVENOUS | Status: DC | PRN
  Administered 2016-10-15: 50 ug/min via INTRAVENOUS

## 2016-10-15 MED ORDER — ENOXAPARIN SODIUM 40 MG/0.4ML ~~LOC~~ SOLN
40.0000 mg | Freq: Every day | SUBCUTANEOUS | Status: DC
  Administered 2016-10-16: 40 mg via SUBCUTANEOUS
  Filled 2016-10-15: qty 0.4

## 2016-10-15 MED ORDER — PROPOFOL 10 MG/ML IV BOLUS
INTRAVENOUS | Status: AC
  Filled 2016-10-15: qty 20

## 2016-10-15 MED ORDER — ONDANSETRON HCL 4 MG/2ML IJ SOLN: 4.0000 mg | Freq: Four times a day (QID) | INTRAMUSCULAR | Status: DC | PRN

## 2016-10-15 MED ORDER — POTASSIUM CHLORIDE 10 MEQ/100ML IV SOLN
10.0000 meq | INTRAVENOUS | Status: AC
Start: 2016-10-15 — End: 2016-10-15
  Administered 2016-10-15 (×2): 10 meq via INTRAVENOUS
  Filled 2016-10-15 (×2): qty 100

## 2016-10-15 MED ORDER — SODIUM CHLORIDE 0.9 % IV SOLN
0.5000 mg/h | INTRAVENOUS | Status: DC
  Filled 2016-10-15: qty 10

## 2016-10-15 MED ORDER — GI COCKTAIL ~~LOC~~
30.0000 mL | Freq: Once | ORAL | Status: AC
  Administered 2016-10-15: 30 mL via ORAL
  Filled 2016-10-15: qty 30

## 2016-10-15 MED ORDER — DEXTROSE-NACL 5-0.9 % IV SOLN
INTRAVENOUS | Status: DC
  Administered 2016-10-16 – 2016-10-18 (×5): via INTRAVENOUS

## 2016-10-15 MED ORDER — METOCLOPRAMIDE HCL 5 MG/ML IJ SOLN
10.0000 mg | Freq: Four times a day (QID) | INTRAMUSCULAR | Status: DC
  Administered 2016-10-16 – 2016-10-19 (×14): 10 mg via INTRAVENOUS
  Filled 2016-10-15 (×14): qty 2

## 2016-10-15 MED ORDER — FENTANYL 2500MCG IN NS 250ML (10MCG/ML) PREMIX INFUSION
25.0000 ug/h | INTRAVENOUS | Status: DC
  Administered 2016-10-16: 25 ug/h via INTRAVENOUS
  Administered 2016-10-17: 750 ug/h via INTRAVENOUS
  Administered 2016-10-18: 25 ug/h via INTRAVENOUS
  Filled 2016-10-15 (×4): qty 250

## 2016-10-15 MED ORDER — DEXMEDETOMIDINE HCL IN NACL 200 MCG/50ML IV SOLN
INTRAVENOUS | Status: DC | PRN
  Administered 2016-10-15: .5 ug/kg/h via INTRAVENOUS

## 2016-10-15 MED ORDER — FENTANYL CITRATE (PF) 100 MCG/2ML IJ SOLN
25.0000 ug | INTRAMUSCULAR | Status: AC | PRN
  Administered 2016-10-16 (×4): 50 ug via INTRAVENOUS
  Administered 2016-10-18: 25 ug via INTRAVENOUS
  Administered 2016-10-18: 50 ug via INTRAVENOUS
  Filled 2016-10-15 (×3): qty 2

## 2016-10-15 MED ORDER — VANCOMYCIN HCL IN DEXTROSE 750-5 MG/150ML-% IV SOLN
750.0000 mg | Freq: Two times a day (BID) | INTRAVENOUS | Status: DC
  Administered 2016-10-16 – 2016-10-21 (×11): 750 mg via INTRAVENOUS
  Filled 2016-10-15 (×11): qty 150

## 2016-10-15 SURGICAL SUPPLY — 76 items
ADH SKN CLS APL DERMABOND .7 (GAUZE/BANDAGES/DRESSINGS) ×1
APL SRG 22X2 LUM MLBL SLNT (VASCULAR PRODUCTS)
APL SRG 7X2 LUM MLBL SLNT (VASCULAR PRODUCTS)
APPLICATOR TIP COSEAL (VASCULAR PRODUCTS) IMPLANT
APPLICATOR TIP EXT COSEAL (VASCULAR PRODUCTS) IMPLANT
CANISTER SUCT 3000ML PPV (MISCELLANEOUS) ×3 IMPLANT
CATH KIT ON Q 5IN SLV (PAIN MANAGEMENT) IMPLANT
CATH THORACIC 28FR (CATHETERS) ×4 IMPLANT
CATH THORACIC 36FR (CATHETERS) ×2 IMPLANT
CATH THORACIC 36FR RT ANG (CATHETERS) IMPLANT
CLEANER TIP ELECTROSURG 2X2 (MISCELLANEOUS) ×3 IMPLANT
CLIP TI MEDIUM 24 (CLIP) ×2 IMPLANT
CLIP TI MEDIUM 6 (CLIP) IMPLANT
CLIP TI WIDE RED SMALL 24 (CLIP) ×2 IMPLANT
CONN ST 1/4X3/8  BEN (MISCELLANEOUS) ×4
CONN ST 1/4X3/8 BEN (MISCELLANEOUS) IMPLANT
CONN Y 3/8X3/8X3/8  BEN (MISCELLANEOUS) ×2
CONN Y 3/8X3/8X3/8 BEN (MISCELLANEOUS) IMPLANT
CONT SPEC 4OZ CLIKSEAL STRL BL (MISCELLANEOUS) ×10 IMPLANT
COVER SURGICAL LIGHT HANDLE (MISCELLANEOUS) ×2 IMPLANT
DERMABOND ADVANCED (GAUZE/BANDAGES/DRESSINGS) ×2
DERMABOND ADVANCED .7 DNX12 (GAUZE/BANDAGES/DRESSINGS) IMPLANT
DRAPE LAPAROSCOPIC ABDOMINAL (DRAPES) ×3 IMPLANT
DRAPE SLUSH/WARMER DISC (DRAPES) ×2 IMPLANT
DRAPE WARM FLUID 44X44 (DRAPE) ×1 IMPLANT
DRSG COVADERM 4X8 (GAUZE/BANDAGES/DRESSINGS) ×2 IMPLANT
ELECT BLADE 6.5 EXT (BLADE) ×2 IMPLANT
ELECT REM PT RETURN 9FT ADLT (ELECTROSURGICAL) ×3
ELECTRODE REM PT RTRN 9FT ADLT (ELECTROSURGICAL) ×1 IMPLANT
GAUZE SPONGE 4X4 12PLY STRL (GAUZE/BANDAGES/DRESSINGS) ×3 IMPLANT
GLOVE BIO SURGEON STRL SZ 6.5 (GLOVE) ×9 IMPLANT
GLOVE BIO SURGEONS STRL SZ 6.5 (GLOVE) ×7
GLOVE EUDERMIC 7 POWDERFREE (GLOVE) ×6 IMPLANT
GOWN STRL REUS W/ TWL LRG LVL3 (GOWN DISPOSABLE) ×4 IMPLANT
GOWN STRL REUS W/ TWL XL LVL3 (GOWN DISPOSABLE) ×1 IMPLANT
GOWN STRL REUS W/TWL LRG LVL3 (GOWN DISPOSABLE) ×12
GOWN STRL REUS W/TWL XL LVL3 (GOWN DISPOSABLE) ×3
KIT BASIN OR (CUSTOM PROCEDURE TRAY) ×3 IMPLANT
KIT ROOM TURNOVER OR (KITS) ×3 IMPLANT
KIT SUCTION CATH 14FR (SUCTIONS) ×3 IMPLANT
NS IRRIG 1000ML POUR BTL (IV SOLUTION) ×10 IMPLANT
PACK CHEST (CUSTOM PROCEDURE TRAY) ×3 IMPLANT
PAD ARMBOARD 7.5X6 YLW CONV (MISCELLANEOUS) ×6 IMPLANT
SEALANT SURG COSEAL 4ML (VASCULAR PRODUCTS) IMPLANT
SEALANT SURG COSEAL 8ML (VASCULAR PRODUCTS) IMPLANT
SOLUTION ANTI FOG 6CC (MISCELLANEOUS) ×4 IMPLANT
SUT PROLENE 3 0 SH DA (SUTURE) IMPLANT
SUT PROLENE 4 0 RB 1 (SUTURE)
SUT PROLENE 4-0 RB1 .5 CRCL 36 (SUTURE) IMPLANT
SUT SILK  1 MH (SUTURE) ×8
SUT SILK 1 MH (SUTURE) ×2 IMPLANT
SUT SILK 1 TIES 10X30 (SUTURE) IMPLANT
SUT SILK 2 0SH CR/8 30 (SUTURE) IMPLANT
SUT SILK 3 0SH CR/8 30 (SUTURE) IMPLANT
SUT VIC AB 1 CTX 18 (SUTURE) IMPLANT
SUT VIC AB 1 CTX 36 (SUTURE) ×6
SUT VIC AB 1 CTX36XBRD ANBCTR (SUTURE) IMPLANT
SUT VIC AB 2-0 CTX 36 (SUTURE) ×2 IMPLANT
SUT VIC AB 2-0 UR6 27 (SUTURE) IMPLANT
SUT VIC AB 3-0 MH 27 (SUTURE) IMPLANT
SUT VIC AB 3-0 X1 27 (SUTURE) ×2 IMPLANT
SUT VICRYL 2 TP 1 (SUTURE) ×4 IMPLANT
SWAB COLLECTION DEVICE MRSA (MISCELLANEOUS) ×2 IMPLANT
SWAB CULTURE ESWAB REG 1ML (MISCELLANEOUS) IMPLANT
SYSTEM SAHARA CHEST DRAIN ATS (WOUND CARE) ×3 IMPLANT
TAPE CLOTH 4X10 WHT NS (GAUZE/BANDAGES/DRESSINGS) ×3 IMPLANT
TIP APPLICATOR SPRAY EXTEND 16 (VASCULAR PRODUCTS) IMPLANT
TOWEL GREEN STERILE (TOWEL DISPOSABLE) ×6 IMPLANT
TOWEL GREEN STERILE FF (TOWEL DISPOSABLE) ×2 IMPLANT
TOWEL OR 17X24 6PK STRL BLUE (TOWEL DISPOSABLE) ×1 IMPLANT
TOWEL OR 17X26 10 PK STRL BLUE (TOWEL DISPOSABLE) ×2 IMPLANT
TRAP SPECIMEN MUCOUS 40CC (MISCELLANEOUS) ×4 IMPLANT
TUBE ANAEROBIC SPECIMEN COL (MISCELLANEOUS) ×2 IMPLANT
TUBE TRACH SHILEY  6 DIST  CUF (TUBING) ×2 IMPLANT
TUBE TRACH SHILEY 4 DIST CUF (TUBING) ×4 IMPLANT
WATER STERILE IRR 1000ML POUR (IV SOLUTION) ×6 IMPLANT

## 2016-10-15 NOTE — Progress Notes (Signed)
Pharmacy Antibiotic Note  Catherine Erickson is a 61 y.o. female admitted on 10/15/2016 with pneumonia.  Pharmacy has been consulted for vancomycin dosing.  Plan: Vancomycin 2000mg  IV once then 750mg  IV every 12 hours.  Goal trough 15-20 mcg/mL. F/U clinical progression and LOT  Height: 5\' 6"  (167.6 cm) Weight: 243 lb (110.2 kg) IBW/kg (Calculated) : 59.3  Temp (24hrs), Avg:98.6 F (37 C), Min:98.6 F (37 C), Max:98.6 F (37 C)   Recent Labs Lab 10/10/16 0637 10/12/16 0324 10/15/16 1505  WBC 23.5* 22.4* 24.6*  CREATININE  --  1.20* 1.16*    Estimated Creatinine Clearance: 66.5 mL/min (A) (by C-G formula based on SCr of 1.16 mg/dL (H)).    No Known Allergies  Antimicrobials this admission: 6/29 Vancomycin >> 6/29 Zosyn x1  Dose adjustments this admission:   Microbiology results: 6/29 BCx: pending  Thank you for allowing pharmacy to be a part of this patient's care.  Jodean Lima Henrick Mcgue 10/15/2016 5:11 PM

## 2016-10-15 NOTE — Anesthesia Preprocedure Evaluation (Addendum)
Anesthesia Evaluation  Patient identified by MRN, date of birth, ID band Patient awake    Reviewed: Allergy & Precautions, NPO status , Patient's Chart, lab work & pertinent test results  Airway Mallampati: Trach       Dental   Pulmonary former smoker,    + rhonchi  + decreased breath sounds      Cardiovascular  Rhythm:Regular Rate:Tachycardia     Neuro/Psych    GI/Hepatic   Endo/Other    Renal/GU      Musculoskeletal   Abdominal (+) + obese,   Peds  Hematology   Anesthesia Other Findings   Reproductive/Obstetrics                            Anesthesia Physical Anesthesia Plan  ASA: IV and emergent  Anesthesia Plan: General   Post-op Pain Management:    Induction: Intravenous  PONV Risk Score and Plan: 1 and Ondansetron, Dexamethasone and Propofol  Airway Management Planned: Tracheostomy  Additional Equipment:   Intra-op Plan:   Post-operative Plan:   Informed Consent: I have reviewed the patients History and Physical, chart, labs and discussed the procedure including the risks, benefits and alternatives for the proposed anesthesia with the patient or authorized representative who has indicated his/her understanding and acceptance.     Plan Discussed with: CRNA and Anesthesiologist  Anesthesia Plan Comments:         Anesthesia Quick Evaluation

## 2016-10-15 NOTE — ED Triage Notes (Signed)
Per EMS pt from home discharged 3 days ago with pneumonia and still taking amox still feeling short of breath. Patient has trach and feels there is something stuck in her throat but she has been providing trach care and flushing with no relief. Patient lungs rhonchi more pronounced on the left, no stridor noted. Patient in NAD. SpO2 88-96%

## 2016-10-15 NOTE — Anesthesia Procedure Notes (Signed)
Central Venous Catheter Insertion Performed by: Roberts Gaudy, anesthesiologist Start/End6/29/2018 8:50 PM, 10/15/2016 8:55 AM Patient location: OR. Preanesthetic checklist: patient identified, IV checked, site marked, risks and benefits discussed, surgical consent, monitors and equipment checked, pre-op evaluation and timeout performed Position: supine Hand hygiene performed , maximum sterile barriers used  and Seldinger technique used Catheter size: 8 Fr Total catheter length 17. Central line was placed.Double lumen Procedure performed using ultrasound guided technique. Ultrasound Notes:anatomy identified, needle tip was noted to be adjacent to the nerve/plexus identified and no ultrasound evidence of intravascular and/or intraneural injection Attempts: 1 Following insertion, line sutured, dressing applied and Biopatch. Post procedure assessment: blood return through all ports, free fluid flow and no air  Patient tolerated the procedure well with no immediate complications.

## 2016-10-15 NOTE — Anesthesia Procedure Notes (Signed)
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

## 2016-10-15 NOTE — ED Provider Notes (Signed)
Patient visit shared.  Pt here with dysphagia, sob.  CT chest concerning for developing abscess.  Broad spectrum abx initiated and CT surgery consulted for further management.  Pt updated of findings of studies and need for admission for further treatment.     Quintella Reichert, MD 10/16/16 1459

## 2016-10-15 NOTE — ED Provider Notes (Signed)
[facility] DEPT Provider Note   CSN: [id number] Arrival date & time: 10/15/16  1319    By signing my name below, I, Catherine Erickson, attest that this documentation has been prepared under the direction and in the presence of Virgel Manifold, MD . Electronically Signed: Evelene Erickson, Scribe. 10/15/2016. 2:08 PM.  History   Chief Complaint Chief Complaint  Patient presents with  . Shortness of Breath    The history is provided by the patient. No language interpreter was used.     HPI Comments:  Catherine Erickson is a 61 y.o. female with trach, who presents to the Emergency Department complaining of intermittent foreign body sensation in her throat x 1 day. She reports associated nausea and dysphagia. Pt states liquids are easier to tolerate than solids. Pt also notes mild SOB, and CP with cough. No fever or vomiting. She was discharged from the hospital 3 days ago; was diagnosed with PNA and started on amoxicillin. No alleviating factors noted.    History reviewed. No pertinent past medical history.  Patient Active Problem List   Diagnosis Date Noted  . Esophageal perforation 10/02/2016  . INSOMNIA 04/22/2009  . HYPERLIPIDEMIA 06/12/2007  . ALLERGIC RHINITIS 06/12/2007  . HYPERTENSION 04/19/2001    Past Surgical History:  Procedure Laterality Date  . ESOPHAGOSCOPY Right 10/02/2016   Procedure: ESOPHAGOSCOPY;  Surgeon: Carol Ada, MD;  Location: [facility];  Service: Endoscopy;  Laterality: Right;  . TRACHEOSTOMY N/A   . VIDEO BRONCHOSCOPY N/A 10/02/2016   Procedure: VIDEO BRONCHOSCOPY;  Surgeon: Melrose Nakayama, MD;  Location: [facility] OR;  Service: Thoracic;  Laterality: N/A;    OB History    Gravida Para Term Preterm AB Living   5 5       5    SAB TAB Ectopic Multiple Live Births                   Home Medications    Prior to Admission medications   Medication Sig Start Date End Date Taking? Authorizing Provider  amLODipine (NORVASC) 5 MG tablet Take 5 mg by mouth  daily after breakfast. 08/09/16   [provider]  amoxicillin-clavulanate (AUGMENTIN) 875-125 MG tablet Take 1 tablet by mouth every 12 (twelve) hours. 10/12/16   Gold, Wilder Glade, PA-C  lisinopril-hydrochlorothiazide (PRINZIDE,ZESTORETIC) 20-25 MG tablet Take 1 tablet by mouth daily after breakfast. 08/09/16   [provider]  traMADol (ULTRAM) 50 MG tablet Take 1 tablet (50 mg total) by mouth every 12 (twelve) hours as needed. 10/12/16   [name], PA-C    Family History History reviewed. No pertinent family history.  Social History Social History  Substance Use Topics  . Smoking status: Former Research scientist (life sciences)  . Smokeless tobacco: Never Used  . Alcohol use No     Allergies   Patient has no known allergies.   Review of Systems Review of Systems  Constitutional: Negative for fever.  HENT: Positive for trouble swallowing.   Respiratory: Positive for cough and shortness of breath.   Cardiovascular: Positive for chest pain.  Gastrointestinal: Positive for nausea. Negative for vomiting.  All other systems reviewed and are negative.    Physical Exam Updated Vital Signs BP (!) 157/76 (BP Location: Left Arm)   Pulse (!) 117   Temp 98.6 F (37 C) (Oral)   Resp (!) 27   Ht 5\' 6"  (1.676 m)   Wt 243 lb (110.2 kg)   SpO2 (!) 88%   BMI 39.22 kg/m   Physical Exam  Constitutional: She is oriented to person, place, and time. She appears well-developed and well-nourished. No distress.  Appears tired  HENT:  Head: Normocephalic and atraumatic.  Marbled appearance to posterior oropharynx and soft palate.   Eyes: Conjunctivae are normal.  Cardiovascular: Tachycardia present.   Pulmonary/Chest: Tachypnea noted.  Trach in place  Course breath sounds bilaterally   Abdominal: She exhibits no distension.  Neurological: She is alert and oriented to person, place, and time.  Skin: Skin is warm and dry.  Nursing note and vitals reviewed.    ED Treatments / Results    COORDINATION OF CARE:  1:30 PM Discussed treatment plan with pt at bedside and pt agreed to plan.  Labs (all labs ordered are listed, but only abnormal results are displayed) Labs Reviewed  BASIC METABOLIC PANEL - Abnormal; Notable for the following:       Result Value   Sodium 131 (*)    Potassium 2.9 (*)    Chloride 90 (*)    Glucose, Bld 112 (*)    Creatinine, Ser 1.16 (*)    GFR calc non Af Amer 51 (*)    GFR calc Af Amer 59 (*)    All other components within normal limits  CBC WITH DIFFERENTIAL/PLATELET - Abnormal; Notable for the following:    WBC 24.6 (*)    HCT 35.6 (*)    RDW 17.0 (*)    Platelets 566 (*)    Neutro Abs 18.9 (*)    Monocytes Absolute 2.4 (*)    All other components within normal limits  CULTURE, BLOOD (ROUTINE X 2)  CULTURE, BLOOD (ROUTINE X 2)  I-STAT CG4 LACTIC ACID, ED    EKG  EKG Interpretation None       Radiology Dg Chest 2 View  Result Date: 10/15/2016 CLINICAL DATA:  Pt c/o SOB. Pt was recently diagnosed with pneumonia and is taking amoxicillin. Pt denies fever at this time. Former smoker. EXAM: CHEST  2 VIEW COMPARISON:  Chest x-rays dated 10/12/2016, 10/08/2016 and 10/02/2016. FINDINGS: Heart size and mediastinal contours appear stable, partially obscured by bibasilar opacities. Bibasilar pleural effusions, likely with associated compressive atelectasis, right greater than left, stable compared to most recent chest x-ray of 10/12/2016. No new lung findings. Tracheostomy tube remains appropriately positioned in the midline. IMPRESSION: No change from 10/12/2016. Bilateral pleural effusions, likely with associated compressive atelectasis, right greater the left. Electronically Signed   By: Franki Cabot M.D.   On: 10/15/2016 14:18   Ct Angio Chest Pe W And/or Wo Contrast  Result Date: 10/15/2016 CLINICAL DATA:  61 year old female with chest pain and shortness of breath for 3 days. Elevated white count. Recent pneumomediastinum and  probable esophageal perforation on 10/02/2016. Subsequent EGD demonstrating esophageal stenosis with subsequent dilatation on 10/02/2016. Unremarkable bronchoscopy on 10/02/2016. EXAM: CT ANGIOGRAPHY CHEST WITH CONTRAST TECHNIQUE: Multidetector CT imaging of the chest was performed using the standard protocol during bolus administration of intravenous contrast. Multiplanar CT image reconstructions and MIPs were obtained to evaluate the vascular anatomy. CONTRAST:  55 cc intravenous Isovue 370 COMPARISON:  10/02/2016 CT.  10/15/2016 and prior radiographs FINDINGS: Cardiovascular: No pulmonary emboli or thoracic aortic aneurysm/ dissection. Mass effect on the posterior left atrium from posterior mediastinal collection noted. No pericardial effusion. Mediastinum/Nodes: Now identified is a large ill-defined fluid with foci of gas throughout the posterior mediastinum/ paraesophageal region measuring 6 cm in greatest transverse dimension and 15.5 cm in craniocaudal dimension. The esophagus is difficult to identify separately from this collection/abscess. There  is mass effect on the posterior left atrium from this collection. A tracheostomy tube is present. Shotty mediastinal lymph nodes are present. Lungs/Pleura: Enlarging moderate right pleural effusion and unchanged small left pleural effusion noted with moderate bilateral lower lung atelectasis. There is no evidence of pneumothorax. A 6 mm right middle lobe nodule is unchanged. Upper Abdomen: No acute abnormality. Musculoskeletal: No acute abnormality. Review of the MIP images confirms the above findings. IMPRESSION: New large ill-defined fluid collection with foci of gas throughout the posterior mediastinum/paraesophageal region compatible with infection/abscess. This collection measures 6 cm in greatest transverse dimension and 15.5 cm in craniocaudal dimension. Enlarging moderate right pleural effusion and unchanged left pleural effusion with moderate bilateral  lower lung atelectasis. 6 mm right middle lobe nodule. Non-contrast chest CT at 6-12 months is recommended. If the nodule is stable at time of repeat CT, then future CT at 18-24 months (from today's scan) is considered optional for low-risk patients, but is recommended for high-risk patients. This recommendation follows the consensus statement: Guidelines for Management of Incidental Pulmonary Nodules Detected on CT Images: From the Fleischner Society 2017; Radiology 2017; 284:228-243. Critical Value/emergent results were called by telephone at the time of interpretation on 10/15/2016 at 4:56 pm to Dr. Ralene Bathe , who verbally acknowledged these results. Electronically Signed   By: Margarette Canada M.D.   On: 10/15/2016 16:57    Procedures Procedures (including critical care time)  CRITICAL CARE Performed by: Virgel Manifold Total critical care time: 35 minutes Critical care time was exclusive of separately billable procedures and treating other patients. Critical care was necessary to treat or prevent imminent or life-threatening deterioration. Critical care was time spent personally by me on the following activities: development of treatment plan with patient and/or surrogate as well as nursing, discussions with consultants, evaluation of patient's response to treatment, examination of patient, obtaining history from patient or surrogate, ordering and performing treatments and interventions, ordering and review of laboratory studies, ordering and review of radiographic studies, pulse oximetry and re-evaluation of patient's condition.   Medications Ordered in ED Medications  gi cocktail (Maalox,Lidocaine,Donnatal) (not administered)  ipratropium-albuterol (DUONEB) 0.5-2.5 (3) MG/3ML nebulizer solution 3 mL (not administered)     Initial Impression / Assessment and Plan / ED Course  I have reviewed the triage vital signs and the nursing notes.  Pertinent labs & imaging results that were available during  my care of the patient were reviewed by me and considered in my medical decision making (see chart for details).     60yF with chest discomfort/dysphagia. Vague pressure/discomfort. She says she feels like things are sticking when she swallows. Feels SOB. No regurgitation. Significant stricture when she was most recently scoped which was dilated. She denies having this difficulty prior though. No obvious perforation was noted but I don't know how much her anatomy may be distorted from the prior caustic ingestion.   She looks uncomfortable and is mildly tachy/tachypneic. She will need another scan. Care signed out to Dr Ralene Bathe.  Final Clinical Impressions(s) / ED Diagnoses   Final diagnoses:  S/P thoracotomy  Esophageal perforation  Mediastinal abscess ([place])    New Prescriptions New Prescriptions   No medications on file   I personally preformed the services scribed in my presence. The recorded information has been reviewed is accurate. Virgel Manifold, MD.     Virgel Manifold, MD 10/24/16 [phone]

## 2016-10-15 NOTE — Anesthesia Postprocedure Evaluation (Signed)
Anesthesia Post Note  Patient: Catherine Erickson  Procedure(s) Performed: Procedure(s) (LRB): THOROCOTOMY DRAINAGE OF MEDIASTINAL ABSCESS AND EMPYEMA (Right)     Patient location during evaluation: SICU Anesthesia Type: General Level of consciousness: sedated and awake Pain management: pain level controlled Vital Signs Assessment: post-procedure vital signs reviewed and stable Respiratory status: patient on ventilator - see flowsheet for VS Anesthetic complications: no Comments: 7.5 ETT replaced with 4.0 Shiley cuffed tracheostomy tube. 500 cc tidal volumes delivered with 28-30 cm H20 peak pressures.    Last Vitals:  Vitals:   10/15/16 1900 10/15/16 1915  BP: (!) 194/128 (!) 168/78  Pulse: (!) 103 (!) 103  Resp: (!) 21 (!) 22  Temp:      Last Pain:  Vitals:   10/15/16 1825  TempSrc:   PainSc: 8                  Kayani Rapaport COKER

## 2016-10-15 NOTE — Anesthesia Procedure Notes (Signed)
Arterial Line Insertion Start/End6/29/2018 8:20 PM Performed by: Linna Caprice DAVID, Sheilla Maris L, CRNA  Patient location: OR. Preanesthetic checklist: patient identified Left, radial was placed Catheter size: 20 G Hand hygiene performed , maximum sterile barriers used  and Seldinger technique used Allen's test indicative of satisfactory collateral circulation Attempts: 1 Procedure performed without using ultrasound guided technique. Following insertion, Biopatch and dressing applied. Post procedure assessment: normal  Patient tolerated the procedure well with no immediate complications.

## 2016-10-15 NOTE — H&P (Signed)
Catherine Erickson is an 61 y.o. female.   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 She presented on 10/02/2016 with sudden onset of chest pain, back pain, fever and shortness of breath. She had no preceding emesis or foreign body ingestion. She had a CT of the chest that showed extensive mediastinal emphysema around the esophagus. She subsequently had a repeat study after oral contrast and there was no extravasation but still high suspicion for esophageal perforation. She was taken to the OR by Dr. Benson Norway and Dr. Roxan Hockey and had an EGD that showed a tight esophageal stricture about 2-3 cm distal to the UES that was dilated. No perforation was seen. Bronchoscopy was done by Dr. Roxan Hockey and did not show any tracheobroncheal injury. A decision was made to treat conservatively with antibiotics and observation and she apparently felt better and was started on a diet. She was discharged on 10/12/2016 on oral antibiotic. She still had a leukocytosis of 22 K. According to her daughter she has continued to feel poorly, not taking much po except some liquids and has had worsening shortness of breath and chest pain.   PMH: hypertension, hyperlipidemia  Past Surgical History:  Procedure Laterality Date  . ESOPHAGOSCOPY Right 10/02/2016   Procedure: ESOPHAGOSCOPY;  Surgeon: Carol Ada, MD;  Location: [facility];  Service: Endoscopy;  Laterality: Right;  . TRACHEOSTOMY N/A   . VIDEO BRONCHOSCOPY N/A 10/02/2016   Procedure: VIDEO BRONCHOSCOPY;  Surgeon: Melrose Nakayama, MD;  Location: [facility] OR;  Service: Thoracic;  Laterality: N/A;  Lye ingestion at age 75  History reviewed. No pertinent family history. Social History:  reports that she has quit smoking. She has never used smokeless tobacco. She reports that she does not drink alcohol or use drugs.  Allergies: No  Known Allergies   (Not in a hospital admission)  Results for orders placed or performed during the hospital encounter of 10/15/16 (from the past 48 hour(s))  Basic metabolic panel     Status: Abnormal   Collection Time: 10/15/16  3:05 PM  Result Value Ref Range   Sodium 131 (L) 135 - 145 mmol/L   Potassium 2.9 (L) 3.5 - 5.1 mmol/L   Chloride 90 (L) 101 - 111 mmol/L   CO2 27 22 - 32 mmol/L   Glucose, Bld 112 (H) 65 - 99 mg/dL   BUN 7 6 - 20 mg/dL   Creatinine, Ser 1.16 (H) 0.44 - 1.00 mg/dL   Calcium 9.3 8.9 - 10.3 mg/dL   GFR calc non Af Amer 51 (L) >60 mL/min   GFR calc Af Amer 59 (L) >60 mL/min    Comment: (NOTE) The eGFR has been calculated using the CKD EPI equation. This calculation has not been validated in all clinical situations. eGFR's persistently <60 mL/min signify possible Chronic Kidney Disease.    Anion gap 14 5 - 15  CBC with Differential     Status: Abnormal   Collection Time: 10/15/16  3:05 PM  Result Value Ref Range   WBC 24.6 (H) 4.0 - 10.5 K/uL   RBC 4.44 3.87 - 5.11 MIL/uL   Hemoglobin 12.2 12.0 - 15.0 g/dL   HCT 35.6 (L) 36.0 - 46.0 %   MCV 80.2 78.0 - 100.0 fL   MCH 27.5 26.0 - 34.0 pg   MCHC 34.3 30.0 - 36.0 g/dL  RDW 17.0 (H) 11.5 - 15.5 %   Platelets 566 (H) 150 - 400 K/uL   Neutrophils Relative % 77 %   Neutro Abs 18.9 (H) 1.7 - 7.7 K/uL   Lymphocytes Relative 12 %   Lymphs Abs 3.0 0.7 - 4.0 K/uL   Monocytes Relative 10 %   Monocytes Absolute 2.4 (H) 0.1 - 1.0 K/uL   Eosinophils Relative 1 %   Eosinophils Absolute 0.2 0.0 - 0.7 K/uL   Basophils Relative 0 %   Basophils Absolute 0.1 0.0 - 0.1 K/uL  I-Stat CG4 Lactic Acid, ED     Status: None   Collection Time: 10/15/16  6:13 PM  Result Value Ref Range   Lactic Acid, Venous 1.60 0.5 - 1.9 mmol/L   Dg Chest 2 View  Result Date: 10/15/2016 CLINICAL DATA:  Pt c/o SOB. Pt was recently diagnosed with pneumonia and is taking amoxicillin. Pt denies fever at this time. Former smoker. EXAM:  CHEST  2 VIEW COMPARISON:  Chest x-rays dated 10/12/2016, 10/08/2016 and 10/02/2016. FINDINGS: Heart size and mediastinal contours appear stable, partially obscured by bibasilar opacities. Bibasilar pleural effusions, likely with associated compressive atelectasis, right greater than left, stable compared to most recent chest x-ray of 10/12/2016. No new lung findings. Tracheostomy tube remains appropriately positioned in the midline. IMPRESSION: No change from 10/12/2016. Bilateral pleural effusions, likely with associated compressive atelectasis, right greater the left. Electronically Signed   By: Franki Cabot M.D.   On: 10/15/2016 14:18   Ct Angio Chest Pe W And/or Wo Contrast  Result Date: 10/15/2016 CLINICAL DATA:  61 year old female with chest pain and shortness of breath for 3 days. Elevated white count. Recent pneumomediastinum and probable esophageal perforation on 10/02/2016. Subsequent EGD demonstrating esophageal stenosis with subsequent dilatation on 10/02/2016. Unremarkable bronchoscopy on 10/02/2016. EXAM: CT ANGIOGRAPHY CHEST WITH CONTRAST TECHNIQUE: Multidetector CT imaging of the chest was performed using the standard protocol during bolus administration of intravenous contrast. Multiplanar CT image reconstructions and MIPs were obtained to evaluate the vascular anatomy. CONTRAST:  55 cc intravenous Isovue 370 COMPARISON:  10/02/2016 CT.  10/15/2016 and prior radiographs FINDINGS: Cardiovascular: No pulmonary emboli or thoracic aortic aneurysm/ dissection. Mass effect on the posterior left atrium from posterior mediastinal collection noted. No pericardial effusion. Mediastinum/Nodes: Now identified is a large ill-defined fluid with foci of gas throughout the posterior mediastinum/ paraesophageal region measuring 6 cm in greatest transverse dimension and 15.5 cm in craniocaudal dimension. The esophagus is difficult to identify separately from this collection/abscess. There is mass effect on the  posterior left atrium from this collection. A tracheostomy tube is present. Shotty mediastinal lymph nodes are present. Lungs/Pleura: Enlarging moderate right pleural effusion and unchanged small left pleural effusion noted with moderate bilateral lower lung atelectasis. There is no evidence of pneumothorax. A 6 mm right middle lobe nodule is unchanged. Upper Abdomen: No acute abnormality. Musculoskeletal: No acute abnormality. Review of the MIP images confirms the above findings. IMPRESSION: New large ill-defined fluid collection with foci of gas throughout the posterior mediastinum/paraesophageal region compatible with infection/abscess. This collection measures 6 cm in greatest transverse dimension and 15.5 cm in craniocaudal dimension. Enlarging moderate right pleural effusion and unchanged left pleural effusion with moderate bilateral lower lung atelectasis. 6 mm right middle lobe nodule. Non-contrast chest CT at 6-12 months is recommended. If the nodule is stable at time of repeat CT, then future CT at 18-24 months (from today's scan) is considered optional for low-risk patients, but is recommended for high-risk patients. This  recommendation follows the consensus statement: Guidelines for Management of Incidental Pulmonary Nodules Detected on CT Images: From the Fleischner Society 2017; Radiology 2017; 284:228-243. Critical Value/emergent results were called by telephone at the time of interpretation on 10/15/2016 at 4:56 pm to Dr. Ralene Bathe , who verbally acknowledged these results. Electronically Signed   By: Margarette Canada M.D.   On: 10/15/2016 16:57    Review of Systems  Constitutional: Positive for fever and malaise/fatigue.  HENT: Positive for sore throat.   Respiratory: Positive for cough, sputum production and shortness of breath.   Cardiovascular: Positive for chest pain and orthopnea.  Gastrointestinal: Positive for nausea.  Genitourinary: Negative.   Musculoskeletal: Positive for back pain.   Neurological: Negative.   Endo/Heme/Allergies: Negative.   Psychiatric/Behavioral: Negative.     Blood pressure (!) 178/103, pulse (!) 113, temperature 98.6 F (37 C), temperature source Oral, resp. rate 18, height '5\' 6"'$  (1.676 m), weight 110.2 kg (243 lb), SpO2 98 %. Physical Exam  Constitutional: She is oriented to person, place, and time.  Morbidly obese. Alert and appears to be uncomfortable. Mild distress  HENT:  Permanent tracheostomy  Cardiovascular: Normal rate and regular rhythm.   No murmur heard. Respiratory: She is in respiratory distress. She has wheezes.  Poor breath sounds on the right  GI: Soft. Bowel sounds are normal. There is no tenderness.  Neurological: She is alert and oriented to person, place, and time.  Skin: Skin is warm and dry.  Psychiatric: She has a normal mood and affect.      CLINICAL DATA:  61 year old female with chest pain and shortness of breath for 3 days. Elevated white count. Recent pneumomediastinum and probable esophageal perforation on 10/02/2016. Subsequent EGD demonstrating esophageal stenosis with subsequent dilatation on 10/02/2016. Unremarkable bronchoscopy on 10/02/2016.  EXAM: CT ANGIOGRAPHY CHEST WITH CONTRAST  TECHNIQUE: Multidetector CT imaging of the chest was performed using the standard protocol during bolus administration of intravenous contrast. Multiplanar CT image reconstructions and MIPs were obtained to evaluate the vascular anatomy.  CONTRAST:  55 cc intravenous Isovue 370  COMPARISON:  10/02/2016 CT.  10/15/2016 and prior radiographs  FINDINGS: Cardiovascular: No pulmonary emboli or thoracic aortic aneurysm/ dissection. Mass effect on the posterior left atrium from posterior mediastinal collection noted. No pericardial effusion.  Mediastinum/Nodes: Now identified is a large ill-defined fluid with foci of gas throughout the posterior mediastinum/ paraesophageal region measuring 6 cm in greatest  transverse dimension and 15.5 cm in craniocaudal dimension. The esophagus is difficult to identify separately from this collection/abscess. There is mass effect on the posterior left atrium from this collection.  A tracheostomy tube is present. Shotty mediastinal lymph nodes are present.  Lungs/Pleura: Enlarging moderate right pleural effusion and unchanged small left pleural effusion noted with moderate bilateral lower lung atelectasis. There is no evidence of pneumothorax. A 6 mm right middle lobe nodule is unchanged.  Upper Abdomen: No acute abnormality.  Musculoskeletal: No acute abnormality.  Review of the MIP images confirms the above findings.  IMPRESSION: New large ill-defined fluid collection with foci of gas throughout the posterior mediastinum/paraesophageal region compatible with infection/abscess. This collection measures 6 cm in greatest transverse dimension and 15.5 cm in craniocaudal dimension.  Enlarging moderate right pleural effusion and unchanged left pleural effusion with moderate bilateral lower lung atelectasis.  6 mm right middle lobe nodule. Non-contrast chest CT at 6-12 months is recommended. If the nodule is stable at time of repeat CT, then future CT at 18-24 months (from today's scan) is considered  optional for low-risk patients, but is recommended for high-risk patients. This recommendation follows the consensus statement: Guidelines for Management of Incidental Pulmonary Nodules Detected on CT Images: From the Fleischner Society 2017; Radiology 2017; 284:228-243.  Critical Value/emergent results were called by telephone at the time of interpretation on 10/15/2016 at 4:56 pm to Dr. Ralene Bathe , who verbally acknowledged these results.   Electronically Signed   By: Margarette Canada M.D.   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 She  presented 13 days ago with sudden chest pain and extensive mediastinal emphysema. An esophageal perforation was suspected but never found. She did have a tight upper esophageal stricture that was dilated by GI but no perforation was seen. She did not have a follow up CT or GG swallow prior to discharge. She now presents with worsening chest pain, shortness of breath, persistent leukocytosis and a new large mediastinal abscess and right empyema with right lung collapse. This will require emergent drainage in the OR via right thoracotomy. We may not find a perforation even if present due to the amount of inflammation and abscess in the mediastinum around the esophagus. The goal of surgery is to drain the abscess and empyema and if there is a small perforation in the esophagus hopefully it will heal. I discussed the operative procedure with the patient and her daughter. I discussed the high risk of complications including but not limited to bleeding, infection, injury to the esophagus or lung, multi-system organ failure and death. They understand and agree to proceed.  Gaye Pollack, MD 10/15/2016, 6:22 PM

## 2016-10-15 NOTE — ED Notes (Signed)
Patient transported to X-ray 

## 2016-10-15 NOTE — Progress Notes (Signed)
Pharmacy Antibiotic Note Catherine Erickson is a 61 y.o. female admitted on 10/15/2016 with large mediastinal abscess and right empyema with right lung collapse.  Pharmacy has been consulted for Zosyn and vancomycin  dosing.  Plan: 1. Zosyn 3.375g IV q8h (4 hour infusion).  2. Continue vancomycin 750 mg IV every 12 hours as previously ordered.  3. SCr every 72 hours while on vancomycin   Height: 5\' 6"  (167.6 cm) Weight: 243 lb (110.2 kg) IBW/kg (Calculated) : 59.3  Temp (24hrs), Avg:98.6 F (37 C), Min:98.6 F (37 C), Max:98.6 F (37 C)   Recent Labs Lab 10/10/16 0637 10/12/16 0324 10/15/16 1505 10/15/16 1813  WBC 23.5* 22.4* 24.6*  --   CREATININE  --  1.20* 1.16*  --   LATICACIDVEN  --   --   --  1.60    Estimated Creatinine Clearance: 66.5 mL/min (A) (by C-G formula based on SCr of 1.16 mg/dL (H)).    No Known Allergies  Antimicrobials this admission: 6/29 Zosyn >>  6/29 vancomycin >>    Microbiology results: 6/29 BCx: px   Thank you for allowing pharmacy to be a part of this patient's care.  Vincenza Hews, PharmD, BCPS 10/15/2016, 11:58 PM

## 2016-10-16 LAB — CBC
HCT: 28 % — ABNORMAL LOW (ref 36.0–46.0)
HCT: 37.6 % (ref 36.0–46.0)
Hemoglobin: 12.4 g/dL (ref 12.0–15.0)
Hemoglobin: 9 g/dL — ABNORMAL LOW (ref 12.0–15.0)
MCH: 26.5 pg (ref 26.0–34.0)
MCH: 27 pg (ref 26.0–34.0)
MCHC: 32.1 g/dL (ref 30.0–36.0)
MCHC: 33 g/dL (ref 30.0–36.0)
MCV: 81.9 fL (ref 78.0–100.0)
MCV: 82.4 fL (ref 78.0–100.0)
PLATELETS: 292 10*3/uL (ref 150–400)
Platelets: 439 10*3/uL — ABNORMAL HIGH (ref 150–400)
RBC: 3.4 MIL/uL — ABNORMAL LOW (ref 3.87–5.11)
RBC: 4.59 MIL/uL (ref 3.87–5.11)
RDW: 17.6 % — ABNORMAL HIGH (ref 11.5–15.5)
RDW: 17.6 % — ABNORMAL HIGH (ref 11.5–15.5)
WBC: 12.8 10*3/uL — ABNORMAL HIGH (ref 4.0–10.5)
WBC: 23.7 10*3/uL — ABNORMAL HIGH (ref 4.0–10.5)

## 2016-10-16 LAB — GLUCOSE, CAPILLARY
GLUCOSE-CAPILLARY: 114 mg/dL — AB (ref 65–99)
GLUCOSE-CAPILLARY: 128 mg/dL — AB (ref 65–99)
GLUCOSE-CAPILLARY: 180 mg/dL — AB (ref 65–99)
Glucose-Capillary: 152 mg/dL — ABNORMAL HIGH (ref 65–99)
Glucose-Capillary: 117 mg/dL — ABNORMAL HIGH (ref 65–99)
Glucose-Capillary: 143 mg/dL — ABNORMAL HIGH (ref 65–99)
Glucose-Capillary: 108 mg/dL — ABNORMAL HIGH (ref 65–99)

## 2016-10-16 LAB — BASIC METABOLIC PANEL
Anion gap: 7 (ref 5–15)
Anion gap: 9 (ref 5–15)
BUN: 5 mg/dL — ABNORMAL LOW (ref 6–20)
BUN: 6 mg/dL (ref 6–20)
CALCIUM: 7.9 mg/dL — AB (ref 8.9–10.3)
CO2: 27 mmol/L (ref 22–32)
CO2: 28 mmol/L (ref 22–32)
CREATININE: 1.48 mg/dL — AB (ref 0.44–1.00)
Calcium: 7.8 mg/dL — ABNORMAL LOW (ref 8.9–10.3)
Chloride: 100 mmol/L — ABNORMAL LOW (ref 101–111)
Chloride: 99 mmol/L — ABNORMAL LOW (ref 101–111)
Creatinine, Ser: 1.39 mg/dL — ABNORMAL HIGH (ref 0.44–1.00)
GFR calc Af Amer: 44 mL/min — ABNORMAL LOW (ref >60)
GFR calc Af Amer: 47 mL/min — ABNORMAL LOW (ref >60)
GFR calc non Af Amer: 41 mL/min — ABNORMAL LOW (ref >60)
GFR, EST NON AFRICAN AMERICAN: 38 mL/min — AB (ref >60)
Glucose, Bld: 118 mg/dL — ABNORMAL HIGH (ref 65–99)
Glucose, Bld: 181 mg/dL — ABNORMAL HIGH (ref 65–99)
POTASSIUM: 3.1 mmol/L — AB (ref 3.5–5.1)
Potassium: 3.1 mmol/L — ABNORMAL LOW (ref 3.5–5.1)
Sodium: 135 mmol/L (ref 135–145)
Sodium: 135 mmol/L (ref 135–145)

## 2016-10-16 LAB — POCT I-STAT 3, ART BLOOD GAS (G3+)
Acid-Base Excess: 3 mmol/L — ABNORMAL HIGH (ref 0.0–2.0)
Bicarbonate: 29.3 mmol/L — ABNORMAL HIGH (ref 20.0–28.0)
O2 SAT: 99 %
PCO2 ART: 50.1 mmHg — AB (ref 32.0–48.0)
Patient temperature: 97.9
TCO2: 31 mmol/L (ref 0–100)
pH, Arterial: 7.373 (ref 7.350–7.450)
pO2, Arterial: 124 mmHg — ABNORMAL HIGH (ref 83.0–108.0)

## 2016-10-16 LAB — MAGNESIUM: MAGNESIUM: 1.9 mg/dL (ref 1.7–2.4)

## 2016-10-16 MED ORDER — CHLORHEXIDINE GLUCONATE CLOTH 2 % EX PADS
6.0000 | MEDICATED_PAD | Freq: Every day | CUTANEOUS | Status: DC
  Administered 2016-10-16 – 2016-10-22 (×6): 6 via TOPICAL

## 2016-10-16 MED ORDER — CHLORHEXIDINE GLUCONATE 0.12% ORAL RINSE (MEDLINE KIT)
15.0000 mL | Freq: Two times a day (BID) | OROMUCOSAL | Status: DC
  Administered 2016-10-16 – 2016-10-24 (×12): 15 mL via OROMUCOSAL
  Filled 2016-10-16: qty 15

## 2016-10-16 MED ORDER — ORAL CARE MOUTH RINSE
15.0000 mL | Freq: Four times a day (QID) | OROMUCOSAL | Status: DC
  Administered 2016-10-16 – 2016-10-26 (×24): 15 mL via OROMUCOSAL

## 2016-10-16 MED ORDER — NOREPINEPHRINE BITARTRATE 1 MG/ML IV SOLN
0.0000 ug/min | INTRAVENOUS | Status: DC
  Administered 2016-10-16 (×2): 5 ug/min via INTRAVENOUS
  Administered 2016-10-16: 10 ug/min via INTRAVENOUS
  Filled 2016-10-16 (×3): qty 4

## 2016-10-16 MED ORDER — POTASSIUM CHLORIDE 10 MEQ/50ML IV SOLN
10.0000 meq | INTRAVENOUS | Status: AC
  Administered 2016-10-16 (×3): 10 meq via INTRAVENOUS
  Filled 2016-10-16 (×3): qty 50

## 2016-10-16 MED ORDER — SODIUM CHLORIDE 0.9% FLUSH
10.0000 mL | Freq: Two times a day (BID) | INTRAVENOUS | Status: DC
  Administered 2016-10-16 – 2016-10-18 (×3): 10 mL
  Administered 2016-10-18: 20 mL
  Administered 2016-10-19 – 2016-10-21 (×3): 10 mL

## 2016-10-16 MED ORDER — SODIUM CHLORIDE 0.9% FLUSH
10.0000 mL | INTRAVENOUS | Status: DC | PRN
  Administered 2016-10-19 – 2016-10-23 (×2): 10 mL
  Filled 2016-10-16 (×2): qty 40

## 2016-10-16 NOTE — Transfer of Care (Signed)
Immediate Anesthesia Transfer of Care Note  Patient: Catherine Erickson  Procedure(s) Performed: Procedure(s): THOROCOTOMY DRAINAGE OF MEDIASTINAL ABSCESS AND EMPYEMA (Right)  Patient Location: SICU  Anesthesia Type:General  Level of Consciousness: awake, alert , oriented, patient cooperative and Patient remains intubated per anesthesia plan  Airway & Oxygen Therapy: Patient remains intubated per anesthesia plan and Patient placed on Ventilator (see vital sign flow sheet for setting)  Post-op Assessment: Report given to RN, Post -op Vital signs reviewed and stable and Patient moving all extremities  Post vital signs: Reviewed and stable  Last Vitals:  Vitals:   10/15/16 1900 10/15/16 1915  BP: (!) 194/128 (!) 168/78  Pulse: (!) 103 (!) 103  Resp: (!) 21 (!) 22  Temp:      Last Pain:  Vitals:   10/15/16 1825  TempSrc:   PainSc: 8          Complications: No apparent anesthesia complications

## 2016-10-16 NOTE — Progress Notes (Signed)
1 Day Post-Op Procedure(s) (LRB): THOROCOTOMY DRAINAGE OF MEDIASTINAL ABSCESS AND EMPYEMA (Right) Subjective: Sedated on vent but fairly alert and appropriate. Says pain control is ok   Objective: Vital signs in last 24 hours: Temp:  [98.4 F (36.9 C)-98.8 F (37.1 C)] 98.5 F (36.9 C) (06/30 0809) Pulse Rate:  [65-117] 75 (06/30 0733) Cardiac Rhythm: Normal sinus rhythm (06/30 0400) Resp:  [0-31] 21 (06/30 0733) BP: (76-198)/(40-128) 127/59 (06/30 0733) SpO2:  [88 %-100 %] 100 % (06/30 0733) Arterial Line BP: (81-176)/(39-119) 110/48 (06/30 0615) FiO2 (%):  [40 %-50 %] 40 % (06/30 0733) Weight:  [106.6 kg (235 lb 0.2 oz)-110.2 kg (243 lb)] 106.6 kg (235 lb 0.2 oz) (06/30 0000)  Hemodynamic parameters for last 24 hours:    Intake/Output from previous day: 06/29 0701 - 06/30 0700 In: 3319.8 [I.V.:2499.8; NG/GT:20; IV Piggyback:800] Out: 9169 [Urine:2550; Blood:250; Chest Tube:160] Intake/Output this shift: Total I/O In: -  Out: 180 [Urine:180]  General appearance: alert and cooperative Heart: regular rate and rhythm, S1, S2 normal, no murmur, click, rub or gallop Lungs: clear to auscultation bilaterally Abdomen: soft, non-tender; bowel sounds normal; no masses,  no organomegaly Wound: dressing dry chest tube output low and serous.  Lab Results:  Recent Labs  10/16/16 0000 10/16/16 0400  WBC 12.8* 23.7*  HGB 12.4 9.0*  HCT 37.6 28.0*  PLT 292 439*   BMET:  Recent Labs  10/16/16 0000 10/16/16 0400  NA 135 135  K 3.1* 3.1*  CL 100* 99*  CO2 28 27  GLUCOSE 118* 181*  BUN 5* 6  CREATININE 1.48* 1.39*  CALCIUM 7.9* 7.8*    PT/INR: No results for input(s): LABPROT, INR in the last 72 hours. ABG    Component Value Date/Time   PHART 7.373 10/16/2016 0543   HCO3 29.3 (H) 10/16/2016 0543   TCO2 31 10/16/2016 0543   O2SAT 99.0 10/16/2016 0543   CBG (last 3)   Recent Labs  10/16/16 0014 10/16/16 0401 10/16/16 0804  GLUCAP 114* 180* 152*     Assessment/Plan: S/P Procedure(s) (LRB): THOROCOTOMY DRAINAGE OF MEDIASTINAL ABSCESS AND EMPYEMA (Right)  Likely due to esophageal perforation although location unknown. I suspect it was a small perforation that occurred high in the esophagus shortly before 6/16 when she presented.  Infection is well-drained. NG is in stomach on CXR. Next week will need GG swallow.  Wean ventilator today and try to extubate.  Continue vanc and Zosyn pending final cultures.  Continue chest tubes to suction  Lovenox and SCD's for DVTprophylaxis   LOS: 1 day    Gaye Pollack 10/16/2016

## 2016-10-16 NOTE — Progress Notes (Signed)
Initial Nutrition Assessment  DOCUMENTATION CODES:   Obesity unspecified  INTERVENTION:  1. Monitor for weaning, extubation. If unable to extubate in 24-48 hrs, recommend initiation of permitted hyporcaloric feedings via NGT  NUTRITION DIAGNOSIS:   Inadequate oral intake related to inability to eat as evidenced by NPO status.  GOAL:   Provide needs based on ASPEN/SCCM guidelines  MONITOR:   Labs, Weight trends, I & O's, Vent status  REASON FOR ASSESSMENT:   Ventilator    ASSESSMENT:   61 yo female admitted with Chest pain and shortness of breath, dysphagia and nausea  Patient is currently intubated on ventilator support MV: 7.6 L/min Temp (24hrs), Avg:98.6 F (37 C), Min:98.4 F (36.9 C), Max:98.8 F (37.1 C) Propofol: none NGT to stomach Family states patient had decreased PO intake upon previous admission, but went home and began to eat again. They deny any weight loss, but appears per chart patient exhibits an 8#/3.3% moderate wt loss over 12 days Nutrition-Focused physical exam completed. Findings are no fat depletion, no muscle depletion, and no edema.  Weaning today with goal of extubation Labs and medications reviewed: K 3.1, CBGs WNL Reglan, Fentanyl gtt, Levo gtt, Precedex gtt D5 NS @ 178mL/hr --> 408 calories  Diet Order:  Diet NPO time specified  Skin:  Wound (see comment) (Closed incision to chest)  Last BM:  PTA  Height:   Ht Readings from Last 1 Encounters:  10/16/16 5\' 6"  (1.676 m)    Weight:   Wt Readings from Last 1 Encounters:  10/16/16 235 lb 0.2 oz (106.6 kg)    Ideal Body Weight:  59.09 kg  BMI:  Body mass index is 37.93 kg/m.  Estimated Nutritional Needs:   Kcal:  1173 - 1492 calories  Protein:  >/= 118 grams  Fluid:  per MD/NP/PA  EDUCATION NEEDS:   No education needs identified at this time  Satira Anis. Harneet Noblett, MS, RD LDN Inpatient Clinical Dietitian Pager [phone]

## 2016-10-17 ENCOUNTER — Inpatient Hospital Stay (HOSPITAL_COMMUNITY): Payer: Medicaid Other

## 2016-10-17 LAB — POCT I-STAT 7, (LYTES, BLD GAS, ICA,H+H)
Acid-Base Excess: 5 mmol/L — ABNORMAL HIGH (ref 0.0–2.0)
Bicarbonate: 28.3 mmol/L — ABNORMAL HIGH (ref 20.0–28.0)
CALCIUM ION: 1.05 mmol/L — AB (ref 1.15–1.40)
HEMATOCRIT: 27 % — AB (ref 36.0–46.0)
HEMOGLOBIN: 9.2 g/dL — AB (ref 12.0–15.0)
O2 Saturation: 95 %
PCO2 ART: 36.5 mmHg (ref 32.0–48.0)
POTASSIUM: 3.4 mmol/L — AB (ref 3.5–5.1)
Patient temperature: 36.7
SODIUM: 138 mmol/L (ref 135–145)
TCO2: 29 mmol/L (ref 0–100)
pH, Arterial: 7.496 — ABNORMAL HIGH (ref 7.350–7.450)
pO2, Arterial: 68 mmHg — ABNORMAL LOW (ref 83.0–108.0)

## 2016-10-17 LAB — POCT I-STAT 4, (NA,K, GLUC, HGB,HCT)
Glucose, Bld: 112 mg/dL — ABNORMAL HIGH (ref 65–99)
HCT: 31 % — ABNORMAL LOW (ref 36.0–46.0)
Hemoglobin: 10.5 g/dL — ABNORMAL LOW (ref 12.0–15.0)
Potassium: 3 mmol/L — ABNORMAL LOW (ref 3.5–5.1)
SODIUM: 138 mmol/L (ref 135–145)

## 2016-10-17 LAB — COMPREHENSIVE METABOLIC PANEL
ALBUMIN: 2.1 g/dL — AB (ref 3.5–5.0)
ALT: 13 U/L — ABNORMAL LOW (ref 14–54)
ANION GAP: 9 (ref 5–15)
AST: 21 U/L (ref 15–41)
Alkaline Phosphatase: 56 U/L (ref 38–126)
BUN: <5 mg/dL — ABNORMAL LOW (ref 6–20)
CO2: 27 mmol/L (ref 22–32)
Calcium: 7.9 mg/dL — ABNORMAL LOW (ref 8.9–10.3)
Chloride: 102 mmol/L (ref 101–111)
Creatinine, Ser: 1.33 mg/dL — ABNORMAL HIGH (ref 0.44–1.00)
GFR calc Af Amer: 50 mL/min — ABNORMAL LOW (ref >60)
GFR calc non Af Amer: 43 mL/min — ABNORMAL LOW (ref >60)
GLUCOSE: 103 mg/dL — AB (ref 65–99)
POTASSIUM: 3.2 mmol/L — AB (ref 3.5–5.1)
SODIUM: 138 mmol/L (ref 135–145)
TOTAL PROTEIN: 6.1 g/dL — AB (ref 6.5–8.1)
Total Bilirubin: 0.8 mg/dL (ref 0.3–1.2)

## 2016-10-17 LAB — CBC
HCT: 24.4 % — ABNORMAL LOW (ref 36.0–46.0)
HCT: 31.6 % — ABNORMAL LOW (ref 36.0–46.0)
Hemoglobin: 8 g/dL — ABNORMAL LOW (ref 12.0–15.0)
Hemoglobin: 10.4 g/dL — ABNORMAL LOW (ref 12.0–15.0)
MCH: 26.8 pg (ref 26.0–34.0)
MCH: 27.5 pg (ref 26.0–34.0)
MCHC: 32.8 g/dL (ref 30.0–36.0)
MCHC: 32.9 g/dL (ref 30.0–36.0)
MCV: 81.9 fL (ref 78.0–100.0)
MCV: 83.6 fL (ref 78.0–100.0)
PLATELETS: 423 10*3/uL — AB (ref 150–400)
PLATELETS: 433 10*3/uL — AB (ref 150–400)
RBC: 2.98 MIL/uL — ABNORMAL LOW (ref 3.87–5.11)
RBC: 3.78 MIL/uL — AB (ref 3.87–5.11)
RDW: 17.8 % — AB (ref 11.5–15.5)
RDW: 17.1 % — AB (ref 11.5–15.5)
WBC: 21.4 10*3/uL — ABNORMAL HIGH (ref 4.0–10.5)
WBC: 21.4 10*3/uL — ABNORMAL HIGH (ref 4.0–10.5)

## 2016-10-17 LAB — GLUCOSE, CAPILLARY
GLUCOSE-CAPILLARY: 111 mg/dL — AB (ref 65–99)
GLUCOSE-CAPILLARY: 100 mg/dL — AB (ref 65–99)
GLUCOSE-CAPILLARY: 103 mg/dL — AB (ref 65–99)
Glucose-Capillary: 96 mg/dL (ref 65–99)

## 2016-10-17 LAB — BASIC METABOLIC PANEL
Anion gap: 7 (ref 5–15)
BUN: 5 mg/dL — AB (ref 6–20)
CALCIUM: 8.3 mg/dL — AB (ref 8.9–10.3)
CO2: 31 mmol/L (ref 22–32)
CREATININE: 1.25 mg/dL — AB (ref 0.44–1.00)
Chloride: 102 mmol/L (ref 101–111)
GFR calc Af Amer: 54 mL/min — ABNORMAL LOW (ref >60)
GFR, EST NON AFRICAN AMERICAN: 46 mL/min — AB (ref >60)
GLUCOSE: 121 mg/dL — AB (ref 65–99)
Potassium: 3.5 mmol/L (ref 3.5–5.1)
SODIUM: 140 mmol/L (ref 135–145)

## 2016-10-17 LAB — PREPARE RBC (CROSSMATCH)

## 2016-10-17 MED ORDER — POTASSIUM CHLORIDE 10 MEQ/50ML IV SOLN
10.0000 meq | INTRAVENOUS | Status: AC
  Administered 2016-10-17: 10 meq via INTRAVENOUS
  Filled 2016-10-17 (×2): qty 50

## 2016-10-17 MED ORDER — SODIUM CHLORIDE 0.9 % IV SOLN
Freq: Once | INTRAVENOUS | Status: AC
  Administered 2016-10-17: 08:00:00 via INTRAVENOUS

## 2016-10-17 MED ORDER — FUROSEMIDE 10 MG/ML IJ SOLN
40.0000 mg | Freq: Once | INTRAMUSCULAR | Status: AC
  Administered 2016-10-17: 40 mg via INTRAVENOUS
  Filled 2016-10-17: qty 4

## 2016-10-17 MED ORDER — POTASSIUM CHLORIDE 10 MEQ/50ML IV SOLN
10.0000 meq | INTRAVENOUS | Status: AC
  Administered 2016-10-17 (×5): 10 meq via INTRAVENOUS
  Filled 2016-10-17 (×4): qty 50

## 2016-10-17 NOTE — Procedures (Signed)
Tracheostomy Change Note  Patient Details:   Name: Catherine Erickson DOB: 23-Aug-1955 MRN: [id number]    Airway Documentation:     Evaluation  O2 sats: stable throughout Complications: No apparent complications Patient did tolerate procedure well. Bilateral Breath Sounds: Rhonchi    Patient trach changed from a #4 cuffed Shiley to a #6 cuffless Shiley by MD.  Patient sats maintained throughout and vitals stayed within normal limits.  Bilateral breath sounds auscultated.  Patient tolerated well.   Alphia Moh N 10/17/2016, 3:02 PM

## 2016-10-17 NOTE — Progress Notes (Addendum)
2 Days Post-Op Procedure(s) (LRB): THOROCOTOMY DRAINAGE OF MEDIASTINAL ABSCESS AND EMPYEMA (Right) Subjective: No complaints on vent. Says her pain is better. Able to talk some with tube in.  Nurse reports some perineal blood this am. She had some in the OR that was noticed when foley inserted and though to be vaginal.  Objective: Vital signs in last 24 hours: Temp:  [98.5 F (36.9 C)-99.4 F (37.4 C)] 99.3 F (37.4 C) (07/01 0317) Pulse Rate:  [64-89] 81 (07/01 0600) Cardiac Rhythm: Normal sinus rhythm (07/01 0100) Resp:  [12-29] 14 (07/01 0600) BP: (90-130)/(34-68) 90/44 (07/01 0600) SpO2:  [100 %] 100 % (07/01 0600) Arterial Line BP: (99-117)/(41-54) 102/42 (06/30 1800) FiO2 (%):  [40 %] 40 % (07/01 0400)  Hemodynamic parameters for last 24 hours:    Intake/Output from previous day: 06/30 0701 - 07/01 0700 In: 3151.3 [I.V.:2751.3; IV Piggyback:400] Out: 2165 [Urine:1970; Emesis/NG output:25; Chest Tube:170] Intake/Output this shift: No intake/output data recorded.  General appearance: alert and cooperative Heart: regular rate and rhythm, S1, S2 normal, no murmur, click, rub or gallop Lungs: clear to auscultation bilaterally Abdomen: soft, non-tender; bowel sounds normal; no masses,  no organomegaly Extremities: extremities normal, atraumatic, no cyanosis or edema Wound: dressing dry chest tube output sero-purulent  Lab Results:  Recent Labs  10/16/16 0400 10/17/16 0400  WBC 23.7* 21.4*  HGB 9.0* 8.0*  HCT 28.0* 24.4*  PLT 439* 423*   BMET:  Recent Labs  10/16/16 0400 10/17/16 0400  NA 135 138  K 3.1* 3.2*  CL 99* 102  CO2 27 27  GLUCOSE 181* 103*  BUN 6 <5*  CREATININE 1.39* 1.33*  CALCIUM 7.8* 7.9*    PT/INR: No results for input(s): LABPROT, INR in the last 72 hours. ABG    Component Value Date/Time   PHART 7.373 10/16/2016 0543   HCO3 29.3 (H) 10/16/2016 0543   TCO2 31 10/16/2016 0543   O2SAT 99.0 10/16/2016 0543   CBG (last 3)   Recent  Labs  10/16/16 2055 10/16/16 2322 10/17/16 0424  GLUCAP 128* 108* 96   CXR: lungs fairly clear. There is some residual effusion at right base. Tubes in good position.  Assessment/Plan: S/P Procedure(s) (LRB): THOROCOTOMY DRAINAGE OF MEDIASTINAL ABSCESS AND EMPYEMA (Right)  She is hemodynamically stable but still requiring low dose levophed to maintain BP.   Acute blood loss anemia. Hgb dropped from 9.0 to 8.0 from yesterday am to today and some bleeding most likely vaginal but could be GI. No prior hx documented in chart. Will discuss with her daughter. Will transfuse 1 unit this am and recheck later today. Hold lovenox and continue SCD's. Continue PPI.  Try to wean vent again this am. She has a #4 trach tube in which is smaller than her usual 6 uncuffed tube so may not breath well through that small tube.  Keep chest tubes in  Continue vanc and Zosyn pending operative cultures.  Decrease IV to 75 and will give a dose of lasix after blood  Replete K+   LOS: 2 days    Gaye Pollack 10/17/2016

## 2016-10-17 NOTE — Progress Notes (Signed)
Patient placed on 40% trach collar.  Currently tolerating well.  Will continue to monitor.  

## 2016-10-17 NOTE — Op Note (Signed)
10/15/2016 Catherine Erickson [id number]  Surgeon: Gaye Pollack, MD   First Assistant: Nicholes Rough, PA-C  Preoperative Diagnosis: Mediastinal abscess and right empyema   Postoperative Diagnosis: Mediastinal abscess and right empyema   Procedure:  1. Right thoracotomy  2. Drainage of empyema  3. Decortication of the right lung  4. 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 She presented on 10/02/2016 with sudden onset of chest pain, back pain, fever and shortness of breath. She had no preceding emesis or foreign body ingestion. She had a CT of the chest that showed extensive mediastinal emphysema around the esophagus. She subsequently had a repeat study after oral contrast and there was no extravasation but still high suspicion for esophageal perforation. She was taken to the OR by Dr. Benson Norway and Dr. Roxan Hockey and had an EGD that showed a tight esophageal stricture about 2-3 cm distal to the UES that was dilated. No perforation was seen. Bronchoscopy was done by Dr. Roxan Hockey and did not show any tracheobroncheal injury. A decision was made to treat conservatively with antibiotics and observation and she apparently felt better and was started on a diet. She was discharged on 10/12/2016 on oral antibiotic. She still had a leukocytosis of 22 K. According to her daughter she has continued to feel poorly, not taking much po except some liquids and has had worsening shortness of breath and chest pain.   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 She presented 13 days ago with sudden chest pain and extensive mediastinal emphysema. An esophageal perforation was suspected but never found. She did have a tight upper esophageal stricture that was dilated by GI but no perforation was seen.  She did not have a follow up CT or GG swallow prior to discharge. She now presents with worsening chest pain, shortness of breath, persistent leukocytosis and a new large mediastinal abscess and right empyema with right lung collapse. This will require emergent drainage in the OR via right thoracotomy. We may not find a perforation even if present due to the amount of inflammation and abscess in the mediastinum around the esophagus. The goal of surgery is to drain the abscess and empyema and if there is a small perforation in the esophagus hopefully it will heal. I discussed the operative procedure with the patient and her daughter. I discussed the high risk of complications including but not limited to bleeding, infection, injury to the esophagus or lung, multi-system organ failure and death. They understand and agree to proceed.   Preparation:  The patient was seen in the preoperative holding area and the correct patient, correct operation, correct operative sidewere confirmed with the patient after reviewing the medical record and CT scan. The consent was signed by me. Preoperative antibiotics were given. The right side of the chest was signed by me. The patient was taken back to the operating room and positioned supine on the operating room table. After being placed under general endotracheal anesthesia by the anesthesia team using a cuffed endotracheal tube placed in the permanent tracheostomy stoma a foley catheter was placed. The nurses noted a collection of blood on the sheets beneath her perineum when putting the foley in and thought that it was vaginal bleeding. There did not appear to be active bleeding.  The patient was turned into  the left lateral decubitus position. The right chest was prepped with betadine soap and solution. A surgical time-out was taken and the correct patient, operative side, and operative procedure were confirmed with the nursing and anesthesia staff.   Operative Procedure:   The chest was opened through a right posterolateral thoracotomy incision  and the chest entered through the 6th ICS. The pleural space was filled with a yellowish green multiloculated empyema that was sero-purulent with a thin fibrinous peel over the lung. The empyema was completely drained and the right lung decorticated. This allowed complete expansion of the lung. The lung was retracted anteriorly to expose the area of the esophagus and there was thick yellow tissue over this area that looked like the lung had tried to seal off some infection in the posterior mediastinum. This was carefully pealed away to expose the posterior mediastinal abscess that drained several hundred cc's of yellowish white creamy pus. This was cultured. This collection appeared to track upwards along the esophagus and to the left posterior to the left atrium. The lower esophagus was identified and appeared intact but as I examined superior to the drainage site the esophagus could not be identified due to the inflammatory process and adhesions of the lung to the mediastinum. Then anesthesia passed an NG tube without difficulty while I was observing the lower esophagus and the tube passed through into the stomach and was not visible in the mediastinum. The chest was irrigated with warm saline. Hemostasis was complete. Two 84 F Bard drains were placed through separate stab incisions and were positioned in the posterior mediastinum along the esophagus in the area of the abscess and posterior to the left atrium. A 36 F chest tube was placed posteriorly up to the apex.  The ribs were reapproximated with # 2 vicryl pericostal sutures and the muscles closed with continuous 0 vicryl suture. The subcutaneous tissue was closed with 2-0 vicryl continuous suture. The skin was closed with staples.  All sponge, needle, and instrument counts were reported correct at the end of the case. Dry sterile dressings were placed over the incisions and around  the chest tubes which were connected to pleurevac suction. The patient was turned supine, extubated,then transported to the ICU in critical but stable condition.

## 2016-10-18 ENCOUNTER — Inpatient Hospital Stay (HOSPITAL_COMMUNITY): Payer: Medicaid Other

## 2016-10-18 ENCOUNTER — Encounter (HOSPITAL_COMMUNITY): Payer: Self-pay | Admitting: Surgery

## 2016-10-18 LAB — GLUCOSE, CAPILLARY
GLUCOSE-CAPILLARY: 100 mg/dL — AB (ref 65–99)
GLUCOSE-CAPILLARY: 111 mg/dL — AB (ref 65–99)
GLUCOSE-CAPILLARY: 111 mg/dL — AB (ref 65–99)
GLUCOSE-CAPILLARY: 120 mg/dL — AB (ref 65–99)
Glucose-Capillary: 102 mg/dL — ABNORMAL HIGH (ref 65–99)
Glucose-Capillary: 119 mg/dL — ABNORMAL HIGH (ref 65–99)
Glucose-Capillary: 104 mg/dL — ABNORMAL HIGH (ref 65–99)

## 2016-10-18 LAB — BASIC METABOLIC PANEL
Anion gap: 6 (ref 5–15)
BUN: 6 mg/dL (ref 6–20)
CO2: 31 mmol/L (ref 22–32)
CREATININE: 1.19 mg/dL — AB (ref 0.44–1.00)
Calcium: 8.3 mg/dL — ABNORMAL LOW (ref 8.9–10.3)
Chloride: 103 mmol/L (ref 101–111)
GFR calc Af Amer: 57 mL/min — ABNORMAL LOW (ref >60)
GFR, EST NON AFRICAN AMERICAN: 49 mL/min — AB (ref >60)
GLUCOSE: 121 mg/dL — AB (ref 65–99)
POTASSIUM: 3.5 mmol/L (ref 3.5–5.1)
SODIUM: 140 mmol/L (ref 135–145)

## 2016-10-18 LAB — CBC
HEMATOCRIT: 30.1 % — AB (ref 36.0–46.0)
HEMOGLOBIN: 9.7 g/dL — AB (ref 12.0–15.0)
MCH: 26.9 pg (ref 26.0–34.0)
MCHC: 32.2 g/dL (ref 30.0–36.0)
MCV: 83.6 fL (ref 78.0–100.0)
PLATELETS: 464 10*3/uL — AB (ref 150–400)
RBC: 3.6 MIL/uL — AB (ref 3.87–5.11)
RDW: 17.1 % — AB (ref 11.5–15.5)
WBC: 18.8 10*3/uL — AB (ref 4.0–10.5)

## 2016-10-18 LAB — VANCOMYCIN, TROUGH: Vancomycin Tr: 17 ug/mL (ref 15–20)

## 2016-10-18 MED ORDER — IOPAMIDOL (ISOVUE-300) INJECTION 61%
INTRAVENOUS | Status: AC
  Filled 2016-10-18: qty 150

## 2016-10-18 MED ORDER — FENTANYL CITRATE (PF) 100 MCG/2ML IJ SOLN
50.0000 ug | INTRAMUSCULAR | Status: DC | PRN
  Administered 2016-10-18 – 2016-10-22 (×5): 50 ug via INTRAVENOUS
  Filled 2016-10-18: qty 2

## 2016-10-18 NOTE — Progress Notes (Signed)
Pharmacy Antibiotic Note Catherine Erickson is a 61 y.o. female admitted on 10/15/2016 with large mediastinal abscess and right empyema with right lung collapse.  Pharmacy has been consulted for Zosyn and vancomycin  Dosing.   Vancomycin trough level tonight is 17 mcg/ml, on target of 15-20 mcg/ml  Right pleural fluid with rare Strep anginosis - susceptibilities to follow.  Blood and pleural tissue cultures negative to date.  Plan:  Continue vancomycin 750 mg IV every 12 hours.  Continue Zosyn 3.375 gm IV q8hrs (each over 4 hours)  F/u cultures, renal function, and clinical course.  Height: 5\' 6"  (167.6 cm) Weight: 235 lb 0.2 oz (106.6 kg) IBW/kg (Calculated) : 59.3  Temp (24hrs), Avg:98.6 F (37 C), Min:98.4 F (36.9 C), Max:98.8 F (37.1 C)   Recent Labs Lab 10/15/16 1813 10/16/16 0000 10/16/16 0400 10/17/16 0400 10/17/16 1724 10/18/16 0531 10/18/16 1749  WBC  --  12.8* 23.7* 21.4* 21.4* 18.8*  --   CREATININE  --  1.48* 1.39* 1.33* 1.25* 1.19*  --   LATICACIDVEN 1.60  --   --   --   --   --   --   VANCOTROUGH  --   --   --   --   --   --  17    Estimated Creatinine Clearance: 63.6 mL/min (A) (by C-G formula based on SCr of 1.19 mg/dL (H)).    No Known Allergies  Antimicrobials this admission:    Vancomycin  6/29 >>     Zosyn  6/29 >>  Dose adjustments this admission:    7/2:  VT = 17 mcg/ml on 750 mg IV q12h - no changed  Microbiology results:  6/29 Blood x 2 - no growith x 3 day so far 6/29 right pleural fluid - rare Strep anginosis - susceptibilities to follow 6/29 right tissue - no growth x 3 days so far   Thank you for allowing pharmacy to be a part of this patient's care.  Arty Baumgartner, [place] Pager: [phone] 10/18/2016 6:58 PM

## 2016-10-18 NOTE — Progress Notes (Signed)
      [address]       [place] [id number]             [phone]      Resting comfortably. Some pain from chest tubes  BP (!) 156/65   Pulse 98   Temp 98.7 F (37.1 C) (Oral)   Resp (!) 21   Ht 5\' 6"  (1.676 m)   Wt 235 lb 0.2 oz (106.6 kg)   SpO2 100%   BMI 37.93 kg/m    Intake/Output Summary (Last 24 hours) at 10/18/16 1802 Last data filed at 10/18/16 1500  Gross per 24 hour  Intake          1954.33 ml  Output             2155 ml  Net          -200.67 ml   No leak on swallow  Remo Lipps C. Roxan Hockey, MD [facility] [phone]

## 2016-10-18 NOTE — Progress Notes (Signed)
3 Days Post-Op Procedure(s) (LRB): THOROCOTOMY DRAINAGE OF MEDIASTINAL ABSCESS AND EMPYEMA (Right) Subjective:  Had a good day. Esophagram done today and showed no sign of leak  Pain seems well-controlled  Objective: Vital signs in last 24 hours: Temp:  [98.4 F (36.9 C)-98.8 F (37.1 C)] 98.7 F (37.1 C) (07/02 1500) Pulse Rate:  [70-103] 98 (07/02 1515) Cardiac Rhythm: Normal sinus rhythm (07/02 0800) Resp:  [0-34] 21 (07/02 1515) BP: (87-160)/(38-78) 156/65 (07/02 1515) SpO2:  [89 %-100 %] 100 % (07/02 1500) FiO2 (%):  [28 %-30 %] 28 % (07/02 1515)  Hemodynamic parameters for last 24 hours:    Intake/Output from previous day: 07/01 0701 - 07/02 0700 In: 2814.2 [I.V.:1629.2; Blood:335; IV Piggyback:850] Out: 7322 [Urine:4310; Emesis/NG output:420; Chest Tube:105] Intake/Output this shift: Total I/O In: 681.8 [I.V.:631.8; IV Piggyback:50] Out: 555 [Urine:465; Chest Tube:90]  General appearance: alert and cooperative Heart: regular rate and rhythm, S1, S2 normal, no murmur, click, rub or gallop Lungs: diminished breath sounds bibasilar Abdomen: soft, non-tender; bowel sounds normal; no masses,  no organomegaly Wound: dressing dry no air leak from chest tubes. drainage low.  Lab Results:  Recent Labs  10/17/16 1724 10/18/16 0531  WBC 21.4* 18.8*  HGB 10.4* 9.7*  HCT 31.6* 30.1*  PLT 433* 464*   BMET:  Recent Labs  10/17/16 1724 10/18/16 0531  NA 140 140  K 3.5 3.5  CL 102 103  CO2 31 31  GLUCOSE 121* 121*  BUN 5* 6  CREATININE 1.25* 1.19*  CALCIUM 8.3* 8.3*    PT/INR: No results for input(s): LABPROT, INR in the last 72 hours. ABG    Component Value Date/Time   PHART 7.373 10/16/2016 0543   HCO3 29.3 (H) 10/16/2016 0543   TCO2 31 10/16/2016 0543   O2SAT 99.0 10/16/2016 0543   CBG (last 3)   Recent Labs  10/18/16 0739 10/18/16 1120 10/18/16 1547  GLUCAP 111* 120* 111*    Assessment/Plan: S/P Procedure(s) (LRB): THOROCOTOMY DRAINAGE  OF MEDIASTINAL ABSCESS AND EMPYEMA (Right)  She has been hemodynamically stable in sinus rhythm  Respiratory status stable  Esophagram negative for esophageal leak and no stricture seen. Will probably remove NG in am.  Continue chest tube to suction  Culture of abscess growing rare strep anginosis. Continue vanc and Zosyn pending final cultures. WBC decreasing and no fever.    LOS: 3 days    Gaye Pollack 10/18/2016

## 2016-10-18 NOTE — Care Management Note (Signed)
Case Management Note Marvetta Gibbons RN, [name] Unit 2W-Case Manager-- [place] coverage [phone]  Patient Details  Name: ANNALIE WENNER MRN: [id number] Date of Birth: August 10, 1955  Subjective/Objective:  Pt admitted with mediastinal abscess- s/p Right thoracotomy  2. Drainage of empyema  3. Decortication of the right lung  4. Drainage of mediastinal abscess on 10/17/16                Action/Plan: PTA pt lived at home with daughter- has [employer] supplies and home 02 with [employer]- CM to follow for d/c needs  Expected Discharge Date:                  Expected Discharge Plan:  Home/Self Care  In-House Referral:     Discharge planning Services  CM Consult  Post Acute Care Choice:    Choice offered to:     DME Arranged:    DME Agency:     HH Arranged:    HH Agency:     Status of Service:  In process, will continue to follow  If discussed at Long Length of Stay Meetings, dates discussed:    Discharge Disposition:   Additional Comments:  Dawayne Patricia, RN 10/18/2016, 10:02 PM

## 2016-10-18 NOTE — Plan of Care (Signed)
Problem: Activity: Goal: Risk for activity intolerance will decrease Outcome: Progressing Pt sat in chair for 3+ hours today.  Problem: Respiratory: Goal: Pain level will decrease with appropriate interventions Outcome: Progressing Pt is requiring decreased oxygen needs and slowly returning towards baseline.  Problem: Pain Management: Goal: Pain level will decrease Outcome: Progressing Pt is currently in no pain and ordered pain medication is appropriate in handling any pain that occurs.

## 2016-10-18 NOTE — Progress Notes (Signed)
Pharmacy Antibiotic Note MATTILYN CRITES is a 61 y.o. female admitted on 10/15/2016 with large mediastinal abscess and right empyema with right lung collapse.  Pharmacy has been consulted for Zosyn and vancomycin  dosing.  Plan: 1. Zosyn 3.375g IV q8h (4 hour infusion).  2. Continue vancomycin 750 mg IV every 12 hours. 3. Will check a vancomycin trough level tonight. 4. F/u cultures, renal function, and clinical course.  Height: 5\' 6"  (167.6 cm) Weight: 235 lb 0.2 oz (106.6 kg) IBW/kg (Calculated) : 59.3  Temp (24hrs), Avg:98.5 F (36.9 C), Min:98.1 F (36.7 C), Max:98.8 F (37.1 C)   Recent Labs Lab 10/15/16 1813 10/16/16 0000 10/16/16 0400 10/17/16 0400 10/17/16 1724 10/18/16 0531  WBC  --  12.8* 23.7* 21.4* 21.4* 18.8*  CREATININE  --  1.48* 1.39* 1.33* 1.25* 1.19*  LATICACIDVEN 1.60  --   --   --   --   --     Estimated Creatinine Clearance: 63.6 mL/min (A) (by C-G formula based on SCr of 1.19 mg/dL (H)).    No Known Allergies  Antimicrobials this admission:  6/29 Vancomycin >>   6/29 Zosyn >>  Dose adjustments this admission:   Microbiology results:  6/29 Bcx x 2 > ngtd 6/29 pleural> ngtd 6/29 tissue> ngtd   Thank you for allowing pharmacy to be a part of this patient's care.  Uvaldo Rising, BCPS  Clinical Pharmacist Pager [phone]  10/18/2016 11:38 AM

## 2016-10-18 NOTE — Progress Notes (Signed)
CRITICAL VALUE ALERT  Critical Value:  Microbiology called with results on pleural fluid culture 6/29.   Gram Stain currently negative but there is some growth in the sterile field for the culture.  Microbiology advised they have no additional information at this time but should have more information this afternoon.  Date & Time Notied:  10/18/2016  1100   Provider Notified: Patient on antibiotics, zosyn and vancomycin, will discuss culture results with MD during evening rounds when MD out of surgery.

## 2016-10-19 ENCOUNTER — Inpatient Hospital Stay (HOSPITAL_COMMUNITY): Payer: Medicaid Other

## 2016-10-19 LAB — BODY FLUID CULTURE: Culture: NO GROWTH

## 2016-10-19 LAB — TYPE AND SCREEN
ABO/RH(D): O POS
ANTIBODY SCREEN: NEGATIVE
Unit division: 0
Unit division: 0

## 2016-10-19 LAB — CBC
HCT: 30.5 % — ABNORMAL LOW (ref 36.0–46.0)
Hemoglobin: 9.7 g/dL — ABNORMAL LOW (ref 12.0–15.0)
MCH: 26.9 pg (ref 26.0–34.0)
MCHC: 31.8 g/dL (ref 30.0–36.0)
MCV: 84.5 fL (ref 78.0–100.0)
PLATELETS: 473 10*3/uL — AB (ref 150–400)
RBC: 3.61 MIL/uL — AB (ref 3.87–5.11)
RDW: 17.1 % — ABNORMAL HIGH (ref 11.5–15.5)
WBC: 15.3 10*3/uL — AB (ref 4.0–10.5)

## 2016-10-19 LAB — GLUCOSE, CAPILLARY
GLUCOSE-CAPILLARY: 107 mg/dL — AB (ref 65–99)
GLUCOSE-CAPILLARY: 109 mg/dL — AB (ref 65–99)
GLUCOSE-CAPILLARY: 116 mg/dL — AB (ref 65–99)
Glucose-Capillary: 110 mg/dL — ABNORMAL HIGH (ref 65–99)
Glucose-Capillary: 131 mg/dL — ABNORMAL HIGH (ref 65–99)
Glucose-Capillary: 132 mg/dL — ABNORMAL HIGH (ref 65–99)
Glucose-Capillary: 79 mg/dL (ref 65–99)

## 2016-10-19 LAB — BASIC METABOLIC PANEL
ANION GAP: 6 (ref 5–15)
BUN: 5 mg/dL — ABNORMAL LOW (ref 6–20)
CALCIUM: 8.5 mg/dL — AB (ref 8.9–10.3)
CO2: 30 mmol/L (ref 22–32)
CREATININE: 1.04 mg/dL — AB (ref 0.44–1.00)
Chloride: 104 mmol/L (ref 101–111)
GFR, EST NON AFRICAN AMERICAN: 58 mL/min — AB (ref >60)
Glucose, Bld: 128 mg/dL — ABNORMAL HIGH (ref 65–99)
Potassium: 3.2 mmol/L — ABNORMAL LOW (ref 3.5–5.1)
SODIUM: 140 mmol/L (ref 135–145)

## 2016-10-19 LAB — BPAM RBC
BLOOD PRODUCT EXPIRATION DATE: 201807192359
Blood Product Expiration Date: 201807192359
ISSUE DATE / TIME: 201806222227
ISSUE DATE / TIME: 201807010857
UNIT TYPE AND RH: 5100
Unit Type and Rh: 5100

## 2016-10-19 MED ORDER — HYDRALAZINE HCL 20 MG/ML IJ SOLN
10.0000 mg | INTRAMUSCULAR | Status: DC | PRN
  Administered 2016-10-20 – 2016-10-22 (×3): 10 mg via INTRAVENOUS
  Filled 2016-10-19 (×3): qty 1

## 2016-10-19 MED ORDER — METOPROLOL TARTRATE 5 MG/5ML IV SOLN
10.0000 mg | Freq: Four times a day (QID) | INTRAVENOUS | Status: DC
  Administered 2016-10-19 – 2016-10-20 (×2): 10 mg via INTRAVENOUS
  Filled 2016-10-19 (×2): qty 10

## 2016-10-19 MED ORDER — ENOXAPARIN SODIUM 40 MG/0.4ML ~~LOC~~ SOLN
40.0000 mg | SUBCUTANEOUS | Status: DC
  Administered 2016-10-19 – 2016-10-24 (×6): 40 mg via SUBCUTANEOUS
  Filled 2016-10-19 (×8): qty 0.4

## 2016-10-19 MED ORDER — POTASSIUM CHLORIDE 10 MEQ/50ML IV SOLN
10.0000 meq | INTRAVENOUS | Status: AC
  Administered 2016-10-19 (×3): 10 meq via INTRAVENOUS
  Filled 2016-10-19 (×2): qty 50

## 2016-10-19 NOTE — Progress Notes (Signed)
Patient ID: Catherine Erickson, female   DOB: 1955/05/09, 61 y.o.   MRN: [id number]  SICU Evening Rounds:  Stable day  Tolerating clear liquids  Hypertensive. Will use prn IV hydralazine and some Lopressor IV until she can take po meds.

## 2016-10-19 NOTE — Progress Notes (Signed)
4 Days Post-Op Procedure(s) (LRB): THOROCOTOMY DRAINAGE OF MEDIASTINAL ABSCESS AND EMPYEMA (Right) Subjective:  No complaints  Ambulated around ICU this am without difficulty  Objective: Vital signs in last 24 hours: Temp:  [98.2 F (36.8 C)-98.8 F (37.1 C)] 98.3 F (36.8 C) (07/03 0400) Pulse Rate:  [70-103] 86 (07/03 0600) Cardiac Rhythm: Normal sinus rhythm (07/02 2000) Resp:  [0-30] 27 (07/03 0600) BP: (90-162)/(39-78) 145/58 (07/03 0600) SpO2:  [96 %-100 %] 99 % (07/03 0600) FiO2 (%):  [28 %] 28 % (07/03 0400) Weight:  [104.6 kg (230 lb 9.6 oz)] 104.6 kg (230 lb 9.6 oz) (07/03 0600)  Hemodynamic parameters for last 24 hours:    Intake/Output from previous day: 07/02 0701 - 07/03 0700 In: 2324.3 [I.V.:1794.3; NG/GT:30; IV Piggyback:500] Out: 9563 [Urine:1415; Emesis/NG output:100; Chest Tube:120] Intake/Output this shift: No intake/output data recorded.  General appearance: alert and cooperative Heart: regular rate and rhythm, S1, S2 normal, no murmur, click, rub or gallop Lungs: diminished breath sounds bibasilar Wound: dressing dry chest tube output low  Lab Results:  Recent Labs  10/18/16 0531 10/19/16 0406  WBC 18.8* 15.3*  HGB 9.7* 9.7*  HCT 30.1* 30.5*  PLT 464* 473*   BMET:  Recent Labs  10/18/16 0531 10/19/16 0406  NA 140 140  K 3.5 3.2*  CL 103 104  CO2 31 30  GLUCOSE 121* 128*  BUN 6 5*  CREATININE 1.19* 1.04*  CALCIUM 8.3* 8.5*    PT/INR: No results for input(s): LABPROT, INR in the last 72 hours. ABG    Component Value Date/Time   PHART 7.373 10/16/2016 0543   HCO3 29.3 (H) 10/16/2016 0543   TCO2 31 10/16/2016 0543   O2SAT 99.0 10/16/2016 0543   CBG (last 3)   Recent Labs  10/18/16 2000 10/18/16 2353 10/19/16 0410  GLUCAP 104* 109* 116*   CXR: minimal right pleural fluid or thickening. Left basilar atelectasis, probably small pleural effusion.  Assessment/Plan: S/P Procedure(s) (LRB): THOROCOTOMY DRAINAGE OF  MEDIASTINAL ABSCESS AND EMPYEMA (Right)  POD 3  She has been hemodynamically stable   Respiratory status good  No leak on esophagram and it looked like a good thorough study. Will remove NG and start clear liquids.  Leukocytosis improving since drainage of abscess and empyema. Continue antibiotics pending final culture results. Will remove the large posterior chest tube and keep the two Bard drains in the abscess cavity.  DC foley  Have PICC inserted for continued IV antibiotics and get central line out since it is clotted and likely to be a source of infection in neck with trach band.  Continue ambulation.   LOS: 4 days    Gaye Pollack 10/19/2016

## 2016-10-20 ENCOUNTER — Inpatient Hospital Stay (HOSPITAL_COMMUNITY): Payer: Medicaid Other

## 2016-10-20 LAB — BASIC METABOLIC PANEL
Anion gap: 7 (ref 5–15)
CO2: 28 mmol/L (ref 22–32)
CREATININE: 0.98 mg/dL (ref 0.44–1.00)
Calcium: 8.5 mg/dL — ABNORMAL LOW (ref 8.9–10.3)
Chloride: 104 mmol/L (ref 101–111)
GFR calc Af Amer: >60 mL/min (ref >60)
GLUCOSE: 91 mg/dL (ref 65–99)
Potassium: 3.4 mmol/L — ABNORMAL LOW (ref 3.5–5.1)
SODIUM: 139 mmol/L (ref 135–145)

## 2016-10-20 LAB — CBC
HCT: 30.1 % — ABNORMAL LOW (ref 36.0–46.0)
Hemoglobin: 9.5 g/dL — ABNORMAL LOW (ref 12.0–15.0)
MCH: 26.3 pg (ref 26.0–34.0)
MCHC: 31.6 g/dL (ref 30.0–36.0)
MCV: 83.4 fL (ref 78.0–100.0)
PLATELETS: 453 10*3/uL — AB (ref 150–400)
RBC: 3.61 MIL/uL — ABNORMAL LOW (ref 3.87–5.11)
RDW: 16.6 % — AB (ref 11.5–15.5)
WBC: 12.7 10*3/uL — ABNORMAL HIGH (ref 4.0–10.5)

## 2016-10-20 LAB — GLUCOSE, CAPILLARY
GLUCOSE-CAPILLARY: 92 mg/dL (ref 65–99)
Glucose-Capillary: 113 mg/dL — ABNORMAL HIGH (ref 65–99)

## 2016-10-20 LAB — CULTURE, BLOOD (ROUTINE X 2)
CULTURE: NO GROWTH
CULTURE: NO GROWTH
SPECIAL REQUESTS: ADEQUATE
SPECIAL REQUESTS: ADEQUATE

## 2016-10-20 MED ORDER — POTASSIUM CHLORIDE 10 MEQ/50ML IV SOLN: 10.0000 meq | INTRAVENOUS | Status: DC

## 2016-10-20 MED ORDER — FUROSEMIDE 10 MG/ML IJ SOLN
40.0000 mg | Freq: Once | INTRAMUSCULAR | Status: AC
  Administered 2016-10-20: 40 mg via INTRAVENOUS
  Filled 2016-10-20: qty 4

## 2016-10-20 MED ORDER — METOPROLOL TARTRATE 25 MG/10 ML ORAL SUSPENSION
25.0000 mg | Freq: Two times a day (BID) | ORAL | Status: DC
  Administered 2016-10-20 – 2016-10-26 (×13): 25 mg via ORAL
  Filled 2016-10-20 (×13): qty 10

## 2016-10-20 MED ORDER — SODIUM CHLORIDE 0.9% FLUSH
10.0000 mL | Freq: Two times a day (BID) | INTRAVENOUS | Status: DC
  Administered 2016-10-20: 10 mL

## 2016-10-20 MED ORDER — DIPHENHYDRAMINE HCL 12.5 MG/5ML PO ELIX
12.5000 mg | ORAL_SOLUTION | Freq: Once | ORAL | Status: AC
  Administered 2016-10-20: 12.5 mg via ORAL
  Filled 2016-10-20: qty 5

## 2016-10-20 MED ORDER — POTASSIUM CHLORIDE 10 MEQ/50ML IV SOLN
10.0000 meq | INTRAVENOUS | Status: AC
  Administered 2016-10-20 (×2): 10 meq via INTRAVENOUS
  Filled 2016-10-20 (×2): qty 50

## 2016-10-20 MED ORDER — SODIUM CHLORIDE 0.9% FLUSH
10.0000 mL | INTRAVENOUS | Status: DC | PRN
  Administered 2016-10-23: 10 mL
  Filled 2016-10-20: qty 40

## 2016-10-20 MED ORDER — OXYCODONE HCL 5 MG/5ML PO SOLN
10.0000 mg | ORAL | Status: DC | PRN
  Administered 2016-10-20 – 2016-10-26 (×26): 10 mg via ORAL
  Filled 2016-10-20 (×28): qty 10

## 2016-10-20 MED ORDER — POTASSIUM CHLORIDE 10 MEQ/50ML IV SOLN
10.0000 meq | INTRAVENOUS | Status: AC
  Administered 2016-10-20: 10 meq via INTRAVENOUS

## 2016-10-20 MED ORDER — CHLORHEXIDINE GLUCONATE CLOTH 2 % EX PADS
6.0000 | MEDICATED_PAD | Freq: Every day | CUTANEOUS | Status: DC
  Administered 2016-10-20 – 2016-10-26 (×3): 6 via TOPICAL

## 2016-10-20 NOTE — Progress Notes (Signed)
Spoke with Dr Servando Snare, leave CVC in until tomorrow when IR can confirm PICC line placement.

## 2016-10-20 NOTE — Progress Notes (Signed)
Peripherally Inserted Central Catheter/Midline Placement  The IV Nurse has discussed with the patient and/or persons authorized to consent for the patient, the purpose of this procedure and the potential benefits and risks involved with this procedure.  The benefits include less needle sticks, lab draws from the catheter, and the patient may be discharged home with the catheter. Risks include, but not limited to, infection, bleeding, blood clot (thrombus formation), and puncture of an artery; nerve damage and irregular heartbeat and possibility to perform a PICC exchange if needed/ordered by physician.  Alternatives to this procedure were also discussed.  Bard Power PICC patient education guide, fact sheet on infection prevention and patient information card has been provided to patient /or left at bedside.    PICC/Midline Placement Documentation  PICC Single Lumen 10/20/16 PICC Right Cephalic 42 cm 0 cm (Active)  Indication for Insertion or Continuance of Line Home intravenous therapies (PICC only) 10/20/2016 10:00 AM  Exposed Catheter (cm) 0 cm 10/20/2016 10:00 AM  Dressing Change Due 10/27/16 10/20/2016 10:00 AM       Jule Economy Horton 10/20/2016, 10:17 AM

## 2016-10-20 NOTE — Progress Notes (Signed)
Attempted right upper arm PICC twice, successful insertion. Unable to determine PICC tip in lower third SVC or CAJ, secondary to L IJ DL CVC. PICC secured in place and will order stat portable chest xray after temporary CVC discontinued and will attempt to trouble shoot tip position. If unable would refer to IR for repositioning tip or placement of new PICC.

## 2016-10-20 NOTE — Plan of Care (Signed)
Problem: Activity: Goal: Risk for activity intolerance will decrease Outcome: Progressing Pt is ambulating multiple times a day around unit without difficulty.  Problem: Cardiac: Goal: Hemodynamic stability will improve Outcome: Progressing Pt is maintaining adequate blood pressures and NSR  Problem: Respiratory: Goal: Respiratory status will improve Outcome: Progressing Pt is working her way back to her baseling of room air. Only occasionally does she need low amounts of supplemental oxygen.

## 2016-10-20 NOTE — Progress Notes (Signed)
Patient ID: Catherine Erickson, female   DOB: [birth year], 61 y.o.   MRN: [id number] EVENING ROUNDS NOTE :     [address]       [place] [address]             [phone]                 5 Days Post-Op Procedure(s) (LRB): THOROCOTOMY DRAINAGE OF MEDIASTINAL ABSCESS AND EMPYEMA (Right)  Total Length of Stay:  LOS: 5 days  BP (!) 151/59   Pulse 85   Temp 98.9 F (37.2 C) (Oral)   Resp (!) 21   Ht 5\' 6"  (1.676 m)   Wt 228 lb 13.4 oz (103.8 kg)   SpO2 96%   BMI 36.94 kg/m   .Intake/Output      07/03 0701 - 07/04 0700 07/04 0701 - 07/05 0700   P.O. 840 840   I.V. (mL/kg) 995 (9.6) 50 (0.5)   NG/GT     IV Piggyback 587.5 250   Total Intake(mL/kg) 2422.5 (23.3) 1140 (11)   Urine (mL/kg/hr) 1 (0) 851 (0.7)   Emesis/NG output     Stool 0 (0)    Chest Tube 110 (0)    Total Output 111 851   Net +2311.5 +289        Urine Occurrence 3 x 1 x   Stool Occurrence 2 x      . dextrose 5 % and 0.9% NaCl 10 mL/hr at 10/20/16 0700  . piperacillin-tazobactam (ZOSYN)  IV 3.375 g (10/20/16 1700)  . potassium chloride 10 mEq (10/19/16 1635)  . vancomycin Stopped (10/20/16 1805)     Lab Results  Component Value Date   WBC 12.7 (H) 10/20/2016   HGB 9.5 (L) 10/20/2016   HCT 30.1 (L) 10/20/2016   PLT 453 (H) 10/20/2016   GLUCOSE 91 10/20/2016   CHOL 243 (H) 10/15/2009   TRIG 196 (H) 10/15/2009   HDL 47 10/15/2009   LDLCALC 157 (H) 10/15/2009   ALT 13 (L) 10/17/2016   AST 21 10/17/2016   NA 139 10/20/2016   K 3.4 (L) 10/20/2016   CL 104 10/20/2016   CREATININE 0.98 10/20/2016   BUN <5 (L) 10/20/2016   CO2 28 10/20/2016   TSH 1.868 03/18/2008   INR 1.25 10/02/2016   Stable Taking po liquids ok  Grace Isaac MD  Beeper [phone] Office [phone] 10/20/2016 6:56 PM

## 2016-10-20 NOTE — Progress Notes (Signed)
5 Days Post-Op Procedure(s) (LRB): THOROCOTOMY DRAINAGE OF MEDIASTINAL ABSCESS AND EMPYEMA (Right) Subjective: No complaints  Objective: Vital signs in last 24 hours: Temp:  [98 F (36.7 C)-99.2 F (37.3 C)] 98.9 F (37.2 C) (07/04 0402) Pulse Rate:  [64-108] 91 (07/04 0600) Cardiac Rhythm: Normal sinus rhythm (07/04 0400) Resp:  [17-30] 25 (07/04 0600) BP: (143-185)/(51-90) 166/69 (07/04 0500) SpO2:  [90 %-98 %] 92 % (07/04 0600) FiO2 (%):  [28 %] 28 % (07/04 0330) Weight:  [103.8 kg (228 lb 13.4 oz)] 103.8 kg (228 lb 13.4 oz) (07/04 0600)  Hemodynamic parameters for last 24 hours:    Intake/Output from previous day: 07/03 0701 - 07/04 0700 In: 2422.5 [P.O.:840; I.V.:995; IV Piggyback:587.5] Out: 81 [Urine:1; Chest Tube:80] Intake/Output this shift: No intake/output data recorded.  General appearance: alert and cooperative Neurologic: intact Heart: regular rate and rhythm, S1, S2 normal, no murmur, click, rub or gallop Lungs: diminished breath sounds bibasilar Abdomen: soft, non-tender; bowel sounds normal; no masses,  no organomegaly Extremities: extremities normal, atraumatic, no cyanosis or edema Wound: incision looks good. Chest tube output low and serous  Lab Results:  Recent Labs  10/19/16 0406 10/20/16 0311  WBC 15.3* 12.7*  HGB 9.7* 9.5*  HCT 30.5* 30.1*  PLT 473* 453*   BMET:  Recent Labs  10/19/16 0406 10/20/16 0311  NA 140 139  K 3.2* 3.4*  CL 104 104  CO2 30 28  GLUCOSE 128* 91  BUN 5* <5*  CREATININE 1.04* 0.98  CALCIUM 8.5* 8.5*    PT/INR: No results for input(s): LABPROT, INR in the last 72 hours. ABG    Component Value Date/Time   PHART 7.373 10/16/2016 0543   HCO3 29.3 (H) 10/16/2016 0543   TCO2 31 10/16/2016 0543   O2SAT 99.0 10/16/2016 0543   CBG (last 3)   Recent Labs  10/19/16 1919 10/19/16 2345 10/20/16 0323  GLUCAP 131* 79 92    Assessment/Plan: S/P Procedure(s) (LRB): THOROCOTOMY DRAINAGE OF MEDIASTINAL  ABSCESS AND EMPYEMA (Right)  POD 4  She is hemodynamically stable and afebrile.  Tolerated clear liquids well. Will advance to full liquids today.  Cultures of abscess grew rare strep anginosis which is likely oral cavity bacteria. She could have had a dental or neck infection lead to this mediastinal abscess. She does not go to the dentist and has her own teeth. She says she did have some pain in her jaw about a month ago but it is difficult to get a clear story from her. Will continue chest tubes for now and advance diet. Continue vanc and Zosyn today and will get ID consult to help determine antibiotic coverage and duration. Ceftriaxone and and anaerobic coverage is usually indicated for this group. WBC coming down nicely and she is doing well clinically.   Awaiting PICC line and then DC central line. She will probably need IV antibiotics for several weeks given the severity of this infection.  Continue mobilization.     LOS: 5 days    Gaye Pollack 10/20/2016

## 2016-10-21 DIAGNOSIS — J869 Pyothorax without fistula: Secondary | ICD-10-CM | POA: Diagnosis present

## 2016-10-21 DIAGNOSIS — Z9889 Other specified postprocedural states: Secondary | ICD-10-CM

## 2016-10-21 DIAGNOSIS — Z87891 Personal history of nicotine dependence: Secondary | ICD-10-CM

## 2016-10-21 DIAGNOSIS — B954 Other streptococcus as the cause of diseases classified elsewhere: Secondary | ICD-10-CM

## 2016-10-21 DIAGNOSIS — Z930 Tracheostomy status: Secondary | ICD-10-CM

## 2016-10-21 DIAGNOSIS — Z95828 Presence of other vascular implants and grafts: Secondary | ICD-10-CM

## 2016-10-21 DIAGNOSIS — J853 Abscess of mediastinum: Principal | ICD-10-CM

## 2016-10-21 DIAGNOSIS — Z9689 Presence of other specified functional implants: Secondary | ICD-10-CM

## 2016-10-21 LAB — BASIC METABOLIC PANEL
Anion gap: 6 (ref 5–15)
BUN: 7 mg/dL (ref 6–20)
CALCIUM: 8.8 mg/dL — AB (ref 8.9–10.3)
CO2: 29 mmol/L (ref 22–32)
CREATININE: 1.37 mg/dL — AB (ref 0.44–1.00)
Chloride: 102 mmol/L (ref 101–111)
GFR calc Af Amer: 48 mL/min — ABNORMAL LOW (ref >60)
GFR calc non Af Amer: 41 mL/min — ABNORMAL LOW (ref >60)
GLUCOSE: 103 mg/dL — AB (ref 65–99)
Potassium: 3.2 mmol/L — ABNORMAL LOW (ref 3.5–5.1)
Sodium: 137 mmol/L (ref 135–145)

## 2016-10-21 LAB — CBC
HCT: 31.8 % — ABNORMAL LOW (ref 36.0–46.0)
HEMOGLOBIN: 10.2 g/dL — AB (ref 12.0–15.0)
MCH: 26.6 pg (ref 26.0–34.0)
MCHC: 32.1 g/dL (ref 30.0–36.0)
MCV: 82.8 fL (ref 78.0–100.0)
PLATELETS: 483 10*3/uL — AB (ref 150–400)
RBC: 3.84 MIL/uL — ABNORMAL LOW (ref 3.87–5.11)
RDW: 16.9 % — ABNORMAL HIGH (ref 11.5–15.5)
WBC: 12 10*3/uL — ABNORMAL HIGH (ref 4.0–10.5)

## 2016-10-21 LAB — AEROBIC/ANAEROBIC CULTURE (SURGICAL/DEEP WOUND): CULTURE: NO GROWTH

## 2016-10-21 LAB — AEROBIC/ANAEROBIC CULTURE W GRAM STAIN (SURGICAL/DEEP WOUND)

## 2016-10-21 MED ORDER — ERTAPENEM SODIUM 1 G IJ SOLR
1.0000 g | INTRAMUSCULAR | Status: DC
Start: 2016-10-21 — End: 2016-10-26
  Administered 2016-10-21 – 2016-10-26 (×6): 1 g via INTRAVENOUS
  Filled 2016-10-21 (×6): qty 1

## 2016-10-21 MED ORDER — DIPHENHYDRAMINE HCL 12.5 MG/5ML PO ELIX
25.0000 mg | ORAL_SOLUTION | Freq: Once | ORAL | Status: AC
  Administered 2016-10-21: 12.5 mg via ORAL
  Filled 2016-10-21: qty 10

## 2016-10-21 MED ORDER — POTASSIUM CHLORIDE 10 MEQ/50ML IV SOLN
10.0000 meq | INTRAVENOUS | Status: AC
  Administered 2016-10-21 (×3): 10 meq via INTRAVENOUS
  Filled 2016-10-21: qty 50
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
## 2016-10-21 NOTE — Progress Notes (Signed)
Nutrition Follow-up  DOCUMENTATION CODES:   Obesity unspecified  INTERVENTION:  Pt intake adequate at this time, if intake declines recommend addition of PO supplement.    NUTRITION DIAGNOSIS:   Inadequate oral intake related to inability to eat as evidenced by NPO status.  Improving as diet advanced, Pt tolerating PO 85% of last 7 meals.   GOAL:   Patient will meet greater than or equal to 90% of their needs  Progressing, monitor for regular diet intake.   MONITOR:   PO intake, Weight trends, Labs  REASON FOR ASSESSMENT:   Ventilator    ASSESSMENT:   61 yo female admitted with Chest pain and shortness of breath, dysphagia and nausea  7/5: Pt consuming 85% of her last 7 meals on liquid diet, upgraded to regular diet 7/5. Reports a small amount of abdominal pain with first regular meal. Denies any difficulties upon swallowing. Will monitor for worsening symptoms. When questioned, pt denies any unintentional wt loss or loss of appetite prior to admission. Estimated energy needs to be recalculated post extubation (see below).  Labs: potassium 3.2 (L) Meds: reviewed, KCl suplementation  Diet Order:  Diet regular Room service appropriate? Yes; Fluid consistency: Thin  Skin:  Wound (see comment) (Closed incision to chest)  Last BM:  10/20/16  Height:   Ht Readings from Last 1 Encounters:  10/16/16 5\' 6"  (1.676 m)    Weight:   Wt Readings from Last 1 Encounters:  10/20/16 228 lb 13.4 oz (103.8 kg)    Ideal Body Weight:  59.09 kg  BMI:  Body mass index is 36.94 kg/m.  Estimated Nutritional Needs:   Kcal:  1600-1800 kcals  Protein:  80-90 grams  Fluid:  >1.6 L/day  EDUCATION NEEDS:   No education needs identified at this time  Mariana Single RD, LDN 10/21/2016 1:51 PM

## 2016-10-21 NOTE — Progress Notes (Signed)
CT surgery p.m. Rounds  Patient feeling well sitting up in chair Waiting for transfer to stepdown Breathing comfortably Minimal pain from VATS incision

## 2016-10-21 NOTE — Progress Notes (Signed)
6 Days Post-Op Procedure(s) (LRB): THOROCOTOMY DRAINAGE OF MEDIASTINAL ABSCESS AND EMPYEMA (Right) Subjective: No complaints. Ambulating well. Tolerating full liquids without difficulty.  Objective: Vital signs in last 24 hours: Temp:  [98.4 F (36.9 C)-98.9 F (37.2 C)] 98.8 F (37.1 C) (07/05 0737) Pulse Rate:  [64-93] 71 (07/05 0753) Cardiac Rhythm: Normal sinus rhythm (07/04 2000) Resp:  [15-32] 23 (07/05 0753) BP: (96-155)/(47-95) 138/60 (07/05 0753) SpO2:  [88 %-100 %] 98 % (07/05 0753) FiO2 (%):  [28 %] 28 % (07/05 0753)  Hemodynamic parameters for last 24 hours:    Intake/Output from previous day: 07/04 0701 - 07/05 0700 In: 1550 [P.O.:960; I.V.:140; IV Piggyback:450] Out: 1251 [Urine:1251] Intake/Output this shift: No intake/output data recorded.  General appearance: alert and cooperative Heart: regular rate and rhythm, S1, S2 normal, no murmur, click, rub or gallop Lungs: diminished breath sounds left base Wound: incision ok chest tube output low.  Lab Results:  Recent Labs  10/20/16 0311 10/21/16 0335  WBC 12.7* 12.0*  HGB 9.5* 10.2*  HCT 30.1* 31.8*  PLT 453* 483*   BMET:  Recent Labs  10/20/16 0311 10/21/16 0335  NA 139 137  K 3.4* 3.2*  CL 104 102  CO2 28 29  GLUCOSE 91 103*  BUN <5* 7  CREATININE 0.98 1.37*  CALCIUM 8.5* 8.8*    PT/INR: No results for input(s): LABPROT, INR in the last 72 hours. ABG    Component Value Date/Time   PHART 7.373 10/16/2016 0543   HCO3 29.3 (H) 10/16/2016 0543   TCO2 31 10/16/2016 0543   O2SAT 99.0 10/16/2016 0543   CBG (last 3)   Recent Labs  10/19/16 2345 10/20/16 0323 10/20/16 0722  GLUCAP 79 92 113*    Assessment/Plan: S/P Procedure(s) (LRB): THOROCOTOMY DRAINAGE OF MEDIASTINAL ABSCESS AND EMPYEMA (Right)  She remains stable and feeling well. Tolerating full liquids so will advance to regular diet which is what she usually eats at home.  Continue mediastinal drains for now.  On vanc  and Zosyn. Will ask ID to see her to help decide about best coverage for this and length of treatment. Cultures only grew strep anginosis which I suspect probably came from oropharynx but source of infection unknown.  IV therapy could not place PICC so will ask IR.  She can transfer to 4E stepdown   LOS: 6 days    Gaye Pollack 10/21/2016

## 2016-10-21 NOTE — Progress Notes (Signed)
Pharmacy Antibiotic Note Catherine Erickson is a 61 y.o. female admitted on 10/15/2016 with large mediastinal abscess and right empyema with right lung collapse.  Pharmacy has been consulted for Zosyn and vancomycin dosing.  Afebrile overnight, wbc stable at 12. Scr trending up this am, will need to follow trend in am and if still increasing adjust antibiotic dosing. Recent vancomycin trough level was at goal.   ID is being asked to evaluate patient and provide long term antibiotic recommendations, will continue to follow for now.  Plan: Continue vancomycin 750 mg IV every 12 hours. Continue Zosyn 3.375 gm IV q8hrs (each over 4 hours) F/u cultures, renal function, and clinical course. Check bmet in am to follow scr trend  Follow up ID recommendations, will d/c bmet if vanc stopped  Height: 5\' 6"  (167.6 cm) Weight: 228 lb 13.4 oz (103.8 kg) IBW/kg (Calculated) : 59.3  Temp (24hrs), Avg:98.6 F (37 C), Min:98.4 F (36.9 C), Max:98.9 F (37.2 C)   Recent Labs Lab 10/15/16 1813  10/17/16 1724 10/18/16 0531 10/18/16 1749 10/19/16 0406 10/20/16 0311 10/21/16 0335  WBC  --   < > 21.4* 18.8*  --  15.3* 12.7* 12.0*  CREATININE  --   < > 1.25* 1.19*  --  1.04* 0.98 1.37*  LATICACIDVEN 1.60  --   --   --   --   --   --   --   VANCOTROUGH  --   --   --   --  17  --   --   --   < > = values in this interval not displayed.  Estimated Creatinine Clearance: 53.8 mL/min (A) (by C-G formula based on SCr of 1.37 mg/dL (H)).    No Known Allergies  Antimicrobials this admission:    Vancomycin  6/29 >>     Zosyn  6/29 >>  Dose adjustments this admission:    7/2:  VT = 17 mcg/ml on 750 mg IV q12h - no change  Microbiology results:  6/29 Bcx x 2 > ngtF 6/29 pleural - rare Strep anginosis -pan-sensitv 6/29 tissue> ngtd  - day 4/5   Thank you for allowing pharmacy to be a part of this patient's care.  Erin Hearing PharmD., BCPS Clinical Pharmacist 10/21/2016 8:38 AM

## 2016-10-22 ENCOUNTER — Inpatient Hospital Stay (HOSPITAL_COMMUNITY): Payer: Medicaid Other

## 2016-10-22 ENCOUNTER — Encounter (HOSPITAL_COMMUNITY): Payer: Self-pay | Admitting: Interventional Radiology

## 2016-10-22 HISTORY — PX: IR FLUORO GUIDE CV LINE RIGHT: IMG2283

## 2016-10-22 LAB — CBC
HCT: 31.3 % — ABNORMAL LOW (ref 36.0–46.0)
Hemoglobin: 9.8 g/dL — ABNORMAL LOW (ref 12.0–15.0)
MCH: 26.5 pg (ref 26.0–34.0)
MCHC: 31.3 g/dL (ref 30.0–36.0)
MCV: 84.6 fL (ref 78.0–100.0)
PLATELETS: 431 10*3/uL — AB (ref 150–400)
RBC: 3.7 MIL/uL — ABNORMAL LOW (ref 3.87–5.11)
RDW: 17.4 % — ABNORMAL HIGH (ref 11.5–15.5)
WBC: 12 10*3/uL — ABNORMAL HIGH (ref 4.0–10.5)

## 2016-10-22 LAB — BASIC METABOLIC PANEL
ANION GAP: 9 (ref 5–15)
BUN: 8 mg/dL (ref 6–20)
CO2: 26 mmol/L (ref 22–32)
Calcium: 8.8 mg/dL — ABNORMAL LOW (ref 8.9–10.3)
Chloride: 103 mmol/L (ref 101–111)
Creatinine, Ser: 1.54 mg/dL — ABNORMAL HIGH (ref 0.44–1.00)
GFR, EST AFRICAN AMERICAN: 42 mL/min — AB (ref >60)
GFR, EST NON AFRICAN AMERICAN: 36 mL/min — AB (ref >60)
Glucose, Bld: 94 mg/dL (ref 65–99)
Potassium: 3.8 mmol/L (ref 3.5–5.1)
SODIUM: 138 mmol/L (ref 135–145)

## 2016-10-22 MED ORDER — POTASSIUM CHLORIDE 10 MEQ/50ML IV SOLN
10.0000 meq | INTRAVENOUS | Status: DC
  Filled 2016-10-22 (×3): qty 50

## 2016-10-22 MED ORDER — IOPAMIDOL (ISOVUE-300) INJECTION 61%
INTRAVENOUS | Status: AC
  Administered 2016-10-22: 10 mL
  Filled 2016-10-22: qty 50

## 2016-10-22 MED ORDER — LIDOCAINE HCL (PF) 1 % IJ SOLN
INTRAMUSCULAR | Status: AC
  Filled 2016-10-22: qty 30

## 2016-10-22 MED ORDER — LIDOCAINE HCL 1 % IJ SOLN
INTRAMUSCULAR | Status: DC | PRN
  Administered 2016-10-22: 2 mL

## 2016-10-22 MED ORDER — CHLORHEXIDINE GLUCONATE 4 % EX LIQD
CUTANEOUS | Status: AC
  Filled 2016-10-22: qty 15

## 2016-10-22 MED ORDER — CHLORHEXIDINE GLUCONATE 4 % EX LIQD
CUTANEOUS | Status: DC | PRN
  Administered 2016-10-22: 1 via TOPICAL

## 2016-10-22 NOTE — Procedures (Signed)
Interventional Radiology Procedure Note  Procedure: Replacement of RUE PICC, which terminated in lateral thoracic vein.  New DL PICC 29cm, terminates in the right subclavian vein.   Findings:  Limited venogram shows SVC stenosis. Wire would not navigate centrally.    Complications: None  Recommendations:  - Ok to use catheter - Do not submerge  - Routine line care   Signed,  Dulcy Fanny. Earleen Newport, DO

## 2016-10-22 NOTE — Progress Notes (Signed)
CM received consult: [facility] for IV [facility]       CM spoke with Pam/ [facility] @ [phone] and referral made for IV home infusion. Whitman Hero RN,[name],CM
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
## 2016-10-22 NOTE — Progress Notes (Signed)
7 Days Post-Op Procedure(s) (LRB): THOROCOTOMY DRAINAGE OF MEDIASTINAL ABSCESS AND EMPYEMA (Right) Subjective: No complaints. Regular food going down ok  Had PICC line adjusted by IR this am.   Objective: Vital signs in last 24 hours: Temp:  [98.4 F (36.9 C)-98.9 F (37.2 C)] 98.6 F (37 C) (07/06 0752) Pulse Rate:  [63-108] 96 (07/06 0814) Cardiac Rhythm: Normal sinus rhythm (07/06 0800) Resp:  [15-33] 18 (07/06 0814) BP: (107-173)/(51-133) 135/61 (07/06 0800) SpO2:  [93 %-100 %] 96 % (07/06 0814) FiO2 (%):  [28 %] 28 % (07/06 0814) Weight:  [102.6 kg (226 lb 1.6 oz)] 102.6 kg (226 lb 1.6 oz) (07/06 0600)  Hemodynamic parameters for last 24 hours:    Intake/Output from previous day: 07/05 0701 - 07/06 0700 In: 742.5 [P.O.:480; IV Piggyback:262.5] Out: 53 [Urine:3; Chest Tube:50] Intake/Output this shift: No intake/output data recorded.  General appearance: alert and cooperative Heart: regular rate and rhythm, S1, S2 normal, no murmur, click, rub or gallop Lungs: clear to auscultation bilaterally Wound: incision looks good chest tube output minimal.   Lab Results:  Recent Labs  10/21/16 0335 10/22/16 0410  WBC 12.0* 12.0*  HGB 10.2* 9.8*  HCT 31.8* 31.3*  PLT 483* 431*   BMET:  Recent Labs  10/21/16 0335 10/22/16 0410  NA 137 138  K 3.2* 3.8  CL 102 103  CO2 29 26  GLUCOSE 103* 94  BUN 7 8  CREATININE 1.37* 1.54*  CALCIUM 8.8* 8.8*    PT/INR: No results for input(s): LABPROT, INR in the last 72 hours. ABG    Component Value Date/Time   PHART 7.373 10/16/2016 0543   HCO3 29.3 (H) 10/16/2016 0543   TCO2 31 10/16/2016 0543   O2SAT 99.0 10/16/2016 0543   CBG (last 3)   Recent Labs  10/19/16 2345 10/20/16 0323 10/20/16 0722  GLUCAP 79 92 113*    Assessment/Plan: S/P Procedure(s) (LRB): THOROCOTOMY DRAINAGE OF MEDIASTINAL ABSCESS AND EMPYEMA (Right)  She remains afebrile and WBC stable at 12.  On Ertapenem for two weeks Remove anterior  chest tube today and the other can come out in a couple days. Elevated creat the past two days may be related to vancomycin which has been stopped. Will follow up labs tomorrow. Awaiting transfer to 4E but I think she could go to 2W.  LOS: 7 days    Gaye Pollack 10/22/2016

## 2016-10-22 NOTE — Progress Notes (Signed)
[facility]  New IV ABX pt for [place] team for home IV ABX. Pt will also need HHRN support with IV ABX teaching, PICC care and wound assessment/care.  [facility] will be prepared for DC when ordered.  If patient discharges after hours, please call [phone].   Larry Sierras 10/22/2016, 1:34 PM

## 2016-10-23 ENCOUNTER — Inpatient Hospital Stay (HOSPITAL_COMMUNITY): Payer: Medicaid Other

## 2016-10-23 LAB — CBC
HCT: 33 % — ABNORMAL LOW (ref 36.0–46.0)
Hemoglobin: 10.5 g/dL — ABNORMAL LOW (ref 12.0–15.0)
MCH: 26.5 pg (ref 26.0–34.0)
MCHC: 31.8 g/dL (ref 30.0–36.0)
MCV: 83.3 fL (ref 78.0–100.0)
Platelets: 397 K/uL (ref 150–400)
RBC: 3.96 MIL/uL (ref 3.87–5.11)
RDW: 17.1 % — ABNORMAL HIGH (ref 11.5–15.5)
WBC: 13.7 K/uL — ABNORMAL HIGH (ref 4.0–10.5)

## 2016-10-23 LAB — BASIC METABOLIC PANEL WITH GFR
Anion gap: 8 (ref 5–15)
BUN: 9 mg/dL (ref 6–20)
CO2: 27 mmol/L (ref 22–32)
Calcium: 9.1 mg/dL (ref 8.9–10.3)
Chloride: 103 mmol/L (ref 101–111)
Creatinine, Ser: 1.52 mg/dL — ABNORMAL HIGH (ref 0.44–1.00)
GFR calc Af Amer: 42 mL/min — ABNORMAL LOW
GFR calc non Af Amer: 36 mL/min — ABNORMAL LOW
Glucose, Bld: 102 mg/dL — ABNORMAL HIGH (ref 65–99)
Potassium: 4 mmol/L (ref 3.5–5.1)
Sodium: 138 mmol/L (ref 135–145)

## 2016-10-23 MED ORDER — AMLODIPINE 1 MG/ML ORAL SUSPENSION
5.0000 mg | Freq: Every day | ORAL | Status: DC
  Administered 2016-10-23 – 2016-10-25 (×3): 5 mg via ORAL
  Filled 2016-10-23 (×7): qty 5

## 2016-10-23 NOTE — Progress Notes (Addendum)
      [address]       [place] [address]             [phone]      8 Days Post-Op Procedure(s) (LRB): THOROCOTOMY DRAINAGE OF MEDIASTINAL ABSCESS AND EMPYEMA (Right)   Subjective:  Catherine Erickson states doing okay.  She does have some pain at her incision site.  She is tolerating a regular diet without complaints  Objective: Vital signs in last 24 hours: Temp:  [98.2 F (36.8 C)-99.1 F (37.3 C)] 98.2 F (36.8 C) (07/07 0400) Pulse Rate:  [72-96] 87 (07/07 0724) Cardiac Rhythm: Normal sinus rhythm (07/07 0700) Resp:  [14-34] 20 (07/07 0724) BP: (131-152)/(59-99) 146/59 (07/07 0400) SpO2:  [95 %-100 %] 100 % (07/07 0724) FiO2 (%):  [28 %] 28 % (07/07 0724)  Intake/Output from previous day: 07/06 0701 - 07/07 0700 In: 300 [P.O.:240; I.V.:10; IV Piggyback:50] Out: -   General appearance: alert, cooperative and no distress Heart: regular rate and rhythm Lungs: coarse clears with cough bilaterally Abdomen: soft, non-tender; bowel sounds normal; no masses,  no organomegaly Extremities: extremities normal, atraumatic, no cyanosis or edema Wound: clean and dry, staples in place  Lab Results:  Recent Labs  10/22/16 0410 10/23/16 0505  WBC 12.0* 13.7*  HGB 9.8* 10.5*  HCT 31.3* 33.0*  PLT 431* 397   BMET:  Recent Labs  10/22/16 0410 10/23/16 0505  NA 138 138  K 3.8 4.0  CL 103 103  CO2 26 27  GLUCOSE 94 102*  BUN 8 9  CREATININE 1.54* 1.52*  CALCIUM 8.8* 9.1    PT/INR: No results for input(s): LABPROT, INR in the last 72 hours. ABG    Component Value Date/Time   PHART 7.373 10/16/2016 0543   HCO3 29.3 (H) 10/16/2016 0543   TCO2 31 10/16/2016 0543   O2SAT 99.0 10/16/2016 0543   CBG (last 3)  No results for input(s): GLUCAP in the last 72 hours.  Assessment/Plan: S/P Procedure(s) (LRB): THOROCOTOMY DRAINAGE OF MEDIASTINAL ABSCESS AND EMPYEMA (Right)  1. CV- NSR, + HTN- continue Lopressor, will restart home Norvasc 2. Pulm-  permanent trach, chest tubes with 30 cc output yesterday, anterior chest tube removed yesterday, CXR ordered for 6 AM hasn't been completed 3. Renal- creatinine is stable at 1.52, K is at 4.0 4. ID- + strep anginosis empyema/mediastinal abscess-- continue IV Ertapenem per ID.Marland Kitchen Remains afebrile, leukocytosis stable 5. Dispo- patient stable, restart Norvasc for HTN, CXR is not yet completed... Leave chest tubes for now per Dr. Cyndia Bent   LOS: 8 days    Ellwood Handler 10/23/2016 Looks good and tolerating regular diet CT with 50 ml out yesterday- may be able to dc chest tube tomorrow  Remo Lipps C. Roxan Hockey, MD [facility] [phone]

## 2016-10-24 MED ORDER — LISINOPRIL 20 MG PO TABS
20.0000 mg | ORAL_TABLET | Freq: Every day | ORAL | Status: DC
  Administered 2016-10-24 – 2016-10-26 (×3): 20 mg via ORAL
  Filled 2016-10-24 (×4): qty 2

## 2016-10-24 MED ORDER — HYDROCHLOROTHIAZIDE 25 MG PO TABS
25.0000 mg | ORAL_TABLET | Freq: Every day | ORAL | Status: DC
  Administered 2016-10-24 – 2016-10-26 (×3): 25 mg via ORAL
  Filled 2016-10-24 (×3): qty 1

## 2016-10-24 NOTE — Progress Notes (Addendum)
      [address]       [place] [address]             [phone]      9 Days Post-Op Procedure(s) (LRB): THOROCOTOMY DRAINAGE OF MEDIASTINAL ABSCESS AND EMPYEMA (Right)   Subjective:  No complaints.    Objective: Vital signs in last 24 hours: Temp:  [98.2 F (36.8 C)-98.6 F (37 C)] 98.2 F (36.8 C) (07/08 0456) Pulse Rate:  [71-91] 78 (07/08 0456) Cardiac Rhythm: Normal sinus rhythm (07/08 0700) Resp:  [16-18] 18 (07/08 0456) BP: (136-173)/(59-75) 173/75 (07/08 0456) SpO2:  [94 %-100 %] 100 % (07/08 0456) FiO2 (%):  [28 %] 28 % (07/08 0336)  General appearance: alert, cooperative and no distress Heart: regular rate and rhythm Lungs: clear to auscultation bilaterally Abdomen: soft, non-tender; bowel sounds normal; no masses,  no organomegaly Extremities: extremities normal, atraumatic, no cyanosis or edema Wound: clean and dry staples in place  Lab Results:  Recent Labs  10/22/16 0410 10/23/16 0505  WBC 12.0* 13.7*  HGB 9.8* 10.5*  HCT 31.3* 33.0*  PLT 431* 397   BMET:  Recent Labs  10/22/16 0410 10/23/16 0505  NA 138 138  K 3.8 4.0  CL 103 103  CO2 26 27  GLUCOSE 94 102*  BUN 8 9  CREATININE 1.54* 1.52*  CALCIUM 8.8* 9.1    PT/INR: No results for input(s): LABPROT, INR in the last 72 hours. ABG    Component Value Date/Time   PHART 7.373 10/16/2016 0543   HCO3 29.3 (H) 10/16/2016 0543   TCO2 31 10/16/2016 0543   O2SAT 99.0 10/16/2016 0543   CBG (last 3)  No results for input(s): GLUCAP in the last 72 hours.  Assessment/Plan: S/P Procedure(s) (LRB): THOROCOTOMY DRAINAGE OF MEDIASTINAL ABSCESS AND EMPYEMA (Right)  1. CV- NSR, remains HTN with SBP in the 170s- continue Lopressor, Norvasc, will restart home Zestoretic 2. Pulm- chest tube- no output recorded yesterday, minimal drainage in tubing this morning- will likely d/c chest tube today 3. Renal- recheck BMET in AM, elevated creatinine felt to be due to vancomycin which  has been discontinued for >24 hours 4. ID- continue IV ABX per recommendations for Strep anginosis empyema/mediastinal abscess, end date is 7/19 5. Dispo- patient stable, restart home zestoretic for HTN, likely d/c chest tube today, possibly ready for d/c in AM   LOS: 9 days    BARRETT, ERIN 10/24/2016

## 2016-10-24 NOTE — Progress Notes (Signed)
Remove CT @1220 . Pt. Tolerated procedure well.

## 2016-10-24 NOTE — Progress Notes (Signed)
Pt sleeping. No obvious respiratory distress noted at this time. RT will continue to monitor.

## 2016-10-24 NOTE — Discharge Instructions (Signed)
Home Health  1. Please continue IV ABX with an end date of 11/04/2016 2. Please check weekly CBC with Diff, BMET with results faxed to ID office at [phone]    Thoracotomy, Care After This sheet gives you information about how to care for yourself after your procedure. Your health care provider may also give you more specific instructions. If you have problems or questions, contact your health care provider. What can I expect after the procedure? After your procedure, it is common to have:  Pain and swelling around the incision area.  Pain when you breathe in (inhale).  Constipation.  Fatigue.  Loss of appetite.  Trouble sleeping.  Mood swings and depression.  Follow these instructions at home: Preventing pneumonia  Take deep breaths or do breathing exercises as instructed by your health care provider.  Cough frequently. Coughing may cause discomfort, but it is important to clear mucus (phlegm) and expand your lungs. If coughing hurts, hold a pillow against your chest or place both hands flat on top of the incision (splinting) when you cough. This may help relieve discomfort.  Continue to use an incentive spirometer as directed. This is a tool that measures how well you fill your lungs with each breath.  Participate in pulmonary rehabilitation as directed. This is a program that combines education, exercise, and support from a team of specialists. The goal is to help you heal and return to normal activities as soon as possible. Medicines  Take over-the-counter or prescription medicines only as told by your health care provider.  If you have pain, take pain-relieving medicine before your pain becomes severe. This is important because if your pain is under control, you will be able to breathe and cough more comfortably.  If you were prescribed an antibiotic medicine, take it as told by your health care provider. Do not stop taking the antibiotic even if you start to feel  better. Activity  Ask your health care provider what activities are safe for you.  Do not travel by airplane for 2 weeks after your chest tube is removed, or until your health care provider says that this is safe.  Do not lift anything that is heavier than 10 lb (4.5 kg), or the limit that your health care provider tells you, until he or she says that it is safe.  Do not drive until your health care provider approves. ? Do not drive or use heavy machinery while taking prescription pain medicine. Incision care  Follow instructions from your health care provider about how to take care of your incision. Make sure you: ? Wash your hands with soap and water before you change your bandage (dressing). If soap and water are not available, use hand sanitizer. ? Change your dressing as told by your health care provider. ? Leave stitches (sutures), skin glue, or adhesive strips in place. These skin closures may need to stay in place for 2 weeks or longer. If adhesive strip edges start to loosen and curl up, you may trim the loose edges. Do not remove adhesive strips completely unless your health care provider tells you to do that.  Keep your dressing dry.  Check your incision area every day for signs of infection. Check for: ? More redness, swelling, or pain. ? More fluid or blood. ? Warmth. ? Pus or a bad smell. Bathing  Do not take baths, swim, or use a hot tub until your health care provider approves. You may take showers.  After your dressing  has been removed, use soap and water to gently wash your incision area. Do not use anything else to clean your incision unless your health care provider tells you to do that. Eating and drinking  Eat a healthy diet as instructed by your health care provider. A healthy diet includes plenty of fresh fruits and vegetables, whole grains, and low-fat (lean) proteins.  Drink enough fluid to keep your urine clear or pale yellow. General instructions  To  prevent or treat constipation while you are taking prescription pain medicine, your health care provider may recommend that you: ? Take over-the-counter or prescription medicines. ? Eat foods that are high in fiber, such as fresh fruits and vegetables, whole grains, and beans. ? Limit foods that are high in fat and processed sugars, such as fried and sweet foods.  Do not use any products that contain nicotine or tobacco, such as cigarettes and e-cigarettes. If you need help quitting, ask your health care provider.  Avoid secondhand smoke.  Wear compression stockings as told by your health care provider. These stockings help to prevent blood clots and reduce swelling in your legs.  If you have a chest tube, care for it as instructed.  Keep all follow-up visits as told by your health care provider. This is important. Contact a health care provider if:  You have more redness, swelling, or pain around your incision.  You have more fluid or blood coming from your incision.  Your incision feels warm to the touch.  You have pus or a bad smell coming from your incision.  You have a fever or chills.  Your heartbeat seems irregular.  You have nausea or vomiting.  You have muscle aches.  You are constipated. This may mean that you have: ? Fewer bowel movements in a week than normal. ? Difficulty having a bowel movement. ? Stools that are dry, hard, or larger than normal. Get help right away if:  You develop a rash.  You feel light-headed or feel like you are going to faint.  You have shortness of breath or trouble breathing.  You are confused.  You have trouble speaking.  You have vision problems.  You are not able to move.  You have numbness in your face, arms, or legs.  You lose consciousness.  You have a sudden, severe headache.  You feel weak.  You have chest pain.  You have pain that: ? Is severe. ? Gets worse, even with medicine. Summary  To prevent  pneumonia, take deep breaths, do breathing exercises, and cough frequently, as instructed by your health care provider.  Do not drive until your health care provider approves. Do not travel by airplane for 2 weeks after your chest tube is removed, or until your health care provider approves.  Check your incision area every day for signs of infection.  Eat a healthy diet that includes plenty of fresh fruits and vegetables, whole grains, and low-fat (lean) proteins. This information is not intended to replace advice given to you by your health care provider. Make sure you discuss any questions you have with your health care provider. Document Released: 09/18/2010 Document Revised: 12/29/2015 Document Reviewed: 12/29/2015 Elsevier Interactive Patient Education  2017 [employer].

## 2016-10-25 ENCOUNTER — Ambulatory Visit: Payer: Medicaid Other | Admitting: Thoracic Surgery (Cardiothoracic Vascular Surgery)

## 2016-10-25 LAB — BASIC METABOLIC PANEL
ANION GAP: 8 (ref 5–15)
BUN: 7 mg/dL (ref 6–20)
CHLORIDE: 102 mmol/L (ref 101–111)
CO2: 29 mmol/L (ref 22–32)
CREATININE: 1.35 mg/dL — AB (ref 0.44–1.00)
Calcium: 9 mg/dL (ref 8.9–10.3)
GFR calc non Af Amer: 42 mL/min — ABNORMAL LOW (ref >60)
GFR, EST AFRICAN AMERICAN: 49 mL/min — AB (ref >60)
GLUCOSE: 105 mg/dL — AB (ref 65–99)
Potassium: 3.3 mmol/L — ABNORMAL LOW (ref 3.5–5.1)
Sodium: 139 mmol/L (ref 135–145)

## 2016-10-25 MED ORDER — POTASSIUM CHLORIDE CRYS ER 20 MEQ PO TBCR
20.0000 meq | EXTENDED_RELEASE_TABLET | Freq: Two times a day (BID) | ORAL | Status: DC
  Administered 2016-10-25 – 2016-10-26 (×3): 20 meq via ORAL
  Filled 2016-10-25 (×3): qty 1

## 2016-10-25 NOTE — Progress Notes (Signed)
PHARMACY CONSULT NOTE FOR:  OUTPATIENT  PARENTERAL ANTIBIOTIC THERAPY (OPAT)  Indication: Mediastinal abscess right pleural empyema Regimen: Ertapenem 1 gram IV every 24 hours End date: 11/04/16  IV antibiotic discharge orders are pended. To discharging provider:  please sign these orders via discharge navigator,  Select New Orders & click on the button choice - Manage This Unsigned Work.     Thank you for allowing pharmacy to be a part of this patient's care.  Vincenza Hews, PharmD, BCPS 10/25/2016, 10:42 AM

## 2016-10-25 NOTE — Progress Notes (Addendum)
      [address]       [place] [address]             [phone]      10 Days Post-Op Procedure(s) (LRB): THOROCOTOMY DRAINAGE OF MEDIASTINAL ABSCESS AND EMPYEMA (Right) Subjective: The patient would like to go home with her daughter. No issues over the weekend. Feels tired this morning.   Objective: Vital signs in last 24 hours: Temp:  [98.1 F (36.7 C)-98.6 F (37 C)] 98.1 F (36.7 C) (07/09 0555) Pulse Rate:  [72-96] 81 (07/09 0555) Cardiac Rhythm: Normal sinus rhythm (07/09 0700) Resp:  [16-18] 18 (07/09 0555) BP: (138-178)/(67-77) 138/67 (07/09 0555) SpO2:  [94 %-100 %] 100 % (07/09 0555) FiO2 (%):  [28 %] 28 % (07/09 0555)     Intake/Output from previous day: No intake/output data recorded. Intake/Output this shift: No intake/output data recorded.  General appearance: alert, cooperative and no distress Heart: regular rate and rhythm, S1, S2 normal, no murmur, click, rub or gallop Lungs: clear to auscultation bilaterally Abdomen: soft, non-tender; bowel sounds normal; no masses,  no organomegaly Extremities: extremities normal, atraumatic, no cyanosis or edema Wound: clean and dry  Lab Results:  Recent Labs  10/23/16 0505  WBC 13.7*  HGB 10.5*  HCT 33.0*  PLT 397   BMET:  Recent Labs  10/23/16 0505 10/25/16 0526  NA 138 139  K 4.0 3.3*  CL 103 102  CO2 27 29  GLUCOSE 102* 105*  BUN 9 7  CREATININE 1.52* 1.35*  CALCIUM 9.1 9.0    PT/INR: No results for input(s): LABPROT, INR in the last 72 hours. ABG    Component Value Date/Time   PHART 7.373 10/16/2016 0543   HCO3 29.3 (H) 10/16/2016 0543   TCO2 31 10/16/2016 0543   O2SAT 99.0 10/16/2016 0543   CBG (last 3)  No results for input(s): GLUCAP in the last 72 hours.  Assessment/Plan: S/P Procedure(s) (LRB): THOROCOTOMY DRAINAGE OF MEDIASTINAL ABSCESS AND EMPYEMA (Right)   1. CV- NSR,BP better controlled today- continue Lopressor, Norvasc, Zestoretic 2. Pulm- chest  tube discontinued yesterday. No CXR to review. Trach collar with 6L/min flow rate.  3. Renal- creatinine down to 1.35 today. Replace potassium. 4. ID- continue IV ABX per recommendations for Strep anginosis empyema/mediastinal abscess, end date is 7/19 5. Dispo- patient stable, BP better controlled, replace potassium, possible d/c today SNF vs home with daughter. She will need home health to assist with IV antibiotics and PICC care.     LOS: 10 days    Elgie Collard 10/25/2016    Breathing comfortably after removal of right chest tube yesterday Surgical incisions clean and dry Patient needs chest x-ray after last tube removal so will send for PA-lateral in a.m. Patient had streptococcal empyema-mediastinal abscess and needs continued IV antibiotics through PICC line through July 19  Tharon Aquas trigt M.D.

## 2016-10-26 ENCOUNTER — Inpatient Hospital Stay (HOSPITAL_COMMUNITY): Payer: Medicaid Other

## 2016-10-26 MED ORDER — ERTAPENEM IV (FOR PTA / DISCHARGE USE ONLY): 1.0000 g | INTRAVENOUS | 0 refills | Status: DC

## 2016-10-26 MED ORDER — METOPROLOL TARTRATE 25 MG PO TABS: 25.0000 mg | ORAL_TABLET | Freq: Two times a day (BID) | ORAL | 1 refills | Status: DC

## 2016-10-26 MED ORDER — POTASSIUM CHLORIDE CRYS ER 20 MEQ PO TBCR: 20.0000 meq | EXTENDED_RELEASE_TABLET | Freq: Two times a day (BID) | ORAL | 1 refills | Status: DC

## 2016-10-26 MED ORDER — OXYCODONE HCL 5 MG PO TABS: 5.0000 mg | ORAL_TABLET | Freq: Four times a day (QID) | ORAL | 0 refills | Status: DC | PRN

## 2016-10-26 NOTE — Care Management Note (Signed)
Case Management Note Marvetta Gibbons RN, [name] Unit 2W-Case Manager-- [place] coverage [phone]  Patient Details  Name: Catherine Erickson MRN: [id number] Date of Birth: 07-Dec-1955  Subjective/Objective:  Pt admitted with mediastinal abscess- s/p Right thoracotomy  2. Drainage of empyema  3. Decortication of the right lung  4. Drainage of mediastinal abscess on 10/17/16                Action/Plan: PTA pt lived at home with daughter- has [employer] supplies and home 02 with [facility]- CM to follow for d/c needs  Expected Discharge Date:  10/26/16               Expected Discharge Plan:  [place]  In-House Referral:     Discharge planning Services  CM Consult  Post Acute Care Choice:   Home Health Choice offered to:  Patient  DME Arranged:  IV pump/equipment DME Agency:  [facility] Arranged:  RN, IV Antibiotics HH Agency:  [facility]  Status of Service:  Completed, signed off  If discussed at [place] of Stay Meetings, dates discussed:  7/10  Discharge Disposition: home/home health   Additional Comments:  10/26/16- 0830- Marvetta Gibbons RN, CM- spoke with Catherine Capers PA with [facility]- pt for d/c home today-with home IV abx- referral has already been done to [facility] for home IV abx needs- have notified Pam with [facility] of pt's d/c home today- she will see pt prior to discharge- plan for pt to receive today's dose before discharge- bedside RN aware- karen with [facility] also aware of discharge- order for [facility] and iv abx have been placed. - pt to d/c home with family - already active with [facility] for [place] and home 02 needs.   Dawayne Patricia, RN 10/26/2016, 10:31 AM

## 2016-10-26 NOTE — Progress Notes (Signed)
Patient discharged to home after speaking with Josh in IR.; patient can go home from their standpoint as a regular PICC had been attempted when patient first admitted, the tip of the PICC could not be advanced therefore a PICC/midline was placed. Patient has received Invanz IV through midline before discharge. Patient left unit via wheelchair, accompanied by family and this nurse.

## 2016-10-26 NOTE — Progress Notes (Signed)
      [address]       [place] [address]             [phone]      11 Days Post-Op Procedure(s) (LRB): THOROCOTOMY DRAINAGE OF MEDIASTINAL ABSCESS AND EMPYEMA (Right) Subjective: She feels good this morning.   Objective: Vital signs in last 24 hours: Temp:  [98.3 F (36.8 C)-98.8 F (37.1 C)] 98.3 F (36.8 C) (07/10 0547) Pulse Rate:  [79-92] 87 (07/10 0725) Cardiac Rhythm: Normal sinus rhythm (07/10 0700) Resp:  [16-20] 16 (07/10 0725) BP: (143-151)/(63-71) 143/65 (07/10 0547) SpO2:  [90 %-99 %] 99 % (07/10 0725) FiO2 (%):  [28 %] 28 % (07/10 0725)    General appearance: alert, cooperative and no distress Heart: regular rate and rhythm, S1, S2 normal, no murmur, click, rub or gallop Lungs: clear to auscultation bilaterally Abdomen: soft, non-tender; bowel sounds normal; no masses,  no organomegaly Extremities: extremities normal, atraumatic, no cyanosis or edema Wound: clean and dry with staples in place. no drainage  Lab Results: No results for input(s): WBC, HGB, HCT, PLT in the last 72 hours. BMET:  Recent Labs  10/25/16 0526  NA 139  K 3.3*  CL 102  CO2 29  GLUCOSE 105*  BUN 7  CREATININE 1.35*  CALCIUM 9.0    PT/INR: No results for input(s): LABPROT, INR in the last 72 hours. ABG    Component Value Date/Time   PHART 7.373 10/16/2016 0543   HCO3 29.3 (H) 10/16/2016 0543   TCO2 31 10/16/2016 0543   O2SAT 99.0 10/16/2016 0543   CBG (last 3)  No results for input(s): GLUCAP in the last 72 hours.  Assessment/Plan: S/P Procedure(s) (LRB): THOROCOTOMY DRAINAGE OF MEDIASTINAL ABSCESS AND EMPYEMA (Right)  1. CV- NSR,BP well controlled- continue Lopressor, Norvasc, Zestoretic 2. Pulm- chest tube discontinued 7/9.CXR reviewed and appears stable, will await official read. Trach collar with 6L/min flow rate with good oxygen saturation 3. Renal- creatinine down to 1.35 today. Replace potassium. 4. ID- continue IV ABX per  recommendations for Strep anginosis empyema/mediastinal abscess, end date is 7/19 5. Dispo-CXR this morning appears stable. Will await official read. IV antibiotics and home health appears to be arranged, but will confirm with case management. Likely home today.     LOS: 11 days    Elgie Collard 10/26/2016

## 2016-10-26 NOTE — Progress Notes (Addendum)
Discharged to home; left unit via wheelchair to room [place] [facility], accompanied by family. Patient has been seen by [facility], wound care and received dose of IVANZ IV. Verbalized understanding of written and verbal discharge instructions; prescriptions given. IV team called to deaccess and heparinize for Home use. PICC line nurse came and determined because of the size, this was not a PICC line but a midline. Notified Dr. Prescott Gum, PA, Johann Capers.; order placed for PICC. Notified family as soon as PICC is placed that patient can go home as planned.

## 2016-10-29 ENCOUNTER — Encounter (INDEPENDENT_AMBULATORY_CARE_PROVIDER_SITE_OTHER): Payer: Self-pay

## 2016-10-29 DIAGNOSIS — Z4802 Encounter for removal of sutures: Secondary | ICD-10-CM

## 2016-11-02 ENCOUNTER — Encounter: Payer: Self-pay | Admitting: Internal Medicine

## 2016-11-03 ENCOUNTER — Encounter (HOSPITAL_COMMUNITY): Payer: Self-pay | Admitting: Interventional Radiology

## 2016-11-04 ENCOUNTER — Telehealth: Payer: Self-pay | Admitting: Pharmacist

## 2016-11-04 ENCOUNTER — Encounter: Payer: Self-pay | Admitting: Internal Medicine

## 2016-11-04 ENCOUNTER — Ambulatory Visit (INDEPENDENT_AMBULATORY_CARE_PROVIDER_SITE_OTHER): Payer: Medicaid Other | Admitting: Internal Medicine

## 2016-11-04 DIAGNOSIS — J853 Abscess of mediastinum: Secondary | ICD-10-CM

## 2016-11-04 MED ORDER — AMOXICILLIN-POT CLAVULANATE 875-125 MG PO TABS: 1.0000 | ORAL_TABLET | Freq: Two times a day (BID) | ORAL | 0 refills | Status: DC

## 2016-11-04 NOTE — Progress Notes (Signed)
[facility] for Infectious Disease  Patient Active Problem List   Diagnosis Date Noted  . Pleural empyema ([place]) 10/21/2016    Priority: High  . 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 Esophageal perforation 10/02/2016    Priority: High  . S/P thoracotomy 10/15/2016  . INSOMNIA 04/22/2009  . HYPERLIPIDEMIA 06/12/2007  . ALLERGIC RHINITIS 06/12/2007  . HYPERTENSION 04/19/2001    Patient's Medications  New Prescriptions   AMOXICILLIN-CLAVULANATE (AUGMENTIN) 875-125 MG TABLET    Take 1 tablet by mouth 2 (two) times daily.  Previous Medications   AMLODIPINE (NORVASC) 5 MG TABLET    Take 5 mg by mouth daily after breakfast.   LISINOPRIL-HYDROCHLOROTHIAZIDE (PRINZIDE,ZESTORETIC) 20-25 MG TABLET    Take 1 tablet by mouth daily after breakfast.   METOPROLOL TARTRATE (LOPRESSOR) 25 MG TABLET    Take 1 tablet (25 mg total) by mouth 2 (two) times daily.   OXYCODONE (ROXICODONE) 5 MG IMMEDIATE RELEASE TABLET    Take 1 tablet (5 mg total) by mouth every 6 (six) hours as needed for severe pain.   POTASSIUM CHLORIDE SA (K-DUR,KLOR-CON) 20 MEQ TABLET    Take 1 tablet (20 mEq total) by mouth 2 (two) times daily.  Modified Medications   No medications on file  Discontinued Medications   ERTAPENEM (INVANZ) IVPB    Inject 1 g into the vein daily. Indication:  Abscess Last Day of Therapy: Until 11/04/16 Labs - Once weekly:  CBC/D and BMP, Labs - Every other week:  ESR and CRP    Subjective: Catherine Erickson is in for her hospital follow-up visit. 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 In mid June she had sudden onset of chest and back pain, fever and shortness of breath leading to admission on 10/02/2016.Marland Kitchen She was found to have pneumomediastinum. She was started on empiric antibiotics for presumed esophageal perforation. She underwent upper endoscopy. She had a proximal stricture which was dilated. No  perforation was found. She was discharged on 10/12/2016 on oral amoxicillin clavulanate. She continued to have pain and shortness of breath and was readmitted 3 days later. She was found to have a mediastinal abscess and right pleural empyema. She underwent surgery to drain the abscess and empyema with VATS decortication of the right lung. Operative Gram stain was negative but cultures grew strep anginosis. I elected to have her discharged home on IV ertapenem. She is now completed 35 days of total antibiotics, 21 days of IV antibiotics and 14 days of ertapenem. She is feeling better. She still has some shortness of breath but this has improved. She is no longer having chest pain other than soreness around her right chest incisions. She is not having any fever. She has some mild stomach upset with nausea that she believes is related to her ertapenem. She has not had any diarrhea. Her nurse has been unable to draw blood from the PICC this week but she has had no problem with her antibiotic infusions. Her appetite remains poor. She is not having any swallowing difficulties.  Review of Systems: Review of Systems  Constitutional: Negative for chills, diaphoresis and fever.  Respiratory: Positive for cough and shortness of breath. Negative for sputum production.   Cardiovascular: Negative for chest pain.  Gastrointestinal: Positive for nausea. Negative for abdominal pain, constipation, diarrhea, heartburn and vomiting.  Musculoskeletal: Negative for back pain.    No past  medical history on file.  Social History  Substance Use Topics  . Smoking status: Former Research scientist (life sciences)  . Smokeless tobacco: Never Used  . Alcohol use No    No family history on file.  No Known Allergies  Objective: Vitals:   11/04/16 1134  BP: (!) 159/92  Pulse: 78  Temp: 98.3 F (36.8 C)  TempSrc: Oral  Weight: 215 lb (97.5 kg)   Body mass index is 34.7 kg/m.  Physical Exam  Constitutional: She is oriented to person,  place, and time.  She is comfortable and in no distress. She is accompanied by her daughter.  Cardiovascular: Normal rate and regular rhythm.   No murmur heard. Pulmonary/Chest: Effort normal and breath sounds normal. She has no wheezes. She has no rales.  Diminished breath sounds on right side posteriorly her incisions are healing.  Abdominal: Soft. There is no tenderness.  Neurological: She is alert and oriented to person, place, and time.  Skin:  Right arm PICC site looks okay.  Psychiatric: Mood and affect normal.    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 I think it is safe and reasonable to change IV ertapenem back to oral amoxicillin clavulanate and have the PICC removed. She will follow-up here in 2 weeks.      Relevant Medications   amoxicillin-clavulanate (AUGMENTIN) 875-125 MG tablet       Catherine Bickers, MD [facility] for Infectious [place] [phone] pager   [phone] cell 11/04/2016, 12:07 PM

## 2016-11-04 NOTE — Assessment & Plan Note (Signed)
She has now had 3 weeks of IV antibiotic therapy following surgery to drain her mediastinal abscess and pleural empyema. I think it is safe and reasonable to change IV ertapenem back to oral amoxicillin clavulanate and have the PICC removed. She will follow-up here in 2 weeks.

## 2016-11-04 NOTE — Telephone Encounter (Signed)
Called and spoke to McKenzie at [facility] and gave verbal order per Dr. Megan Salon to d/c ertapenem and remove patient's PICC line. Coretta verbalized understanding.

## 2016-11-09 ENCOUNTER — Other Ambulatory Visit: Payer: Self-pay | Admitting: Surgery

## 2016-11-09 DIAGNOSIS — Z9889 Other specified postprocedural states: Secondary | ICD-10-CM

## 2016-11-10 ENCOUNTER — Ambulatory Visit (INDEPENDENT_AMBULATORY_CARE_PROVIDER_SITE_OTHER): Payer: Self-pay | Admitting: Surgery

## 2016-11-10 ENCOUNTER — Ambulatory Visit
Admission: RE | Admit: 2016-11-10 | Discharge: 2016-11-10 | Disposition: A | Discharge status: Home / Self Care | Payer: Medicaid Other | Source: Ambulatory Visit | Attending: Surgery | Admitting: Surgery

## 2016-11-10 ENCOUNTER — Encounter: Payer: Self-pay | Admitting: Surgery

## 2016-11-10 VITALS — BP 138/83 | HR 75 | Resp 18 | Ht 62.0 in | Wt 215.0 lb

## 2016-11-10 DIAGNOSIS — Z9889 Other specified postprocedural states: Secondary | ICD-10-CM

## 2016-11-10 DIAGNOSIS — J869 Pyothorax without fistula: Secondary | ICD-10-CM

## 2016-11-10 DIAGNOSIS — Z09 Encounter for follow-up examination after completed treatment for conditions other than malignant neoplasm: Secondary | ICD-10-CM

## 2016-11-10 DIAGNOSIS — J853 Abscess of mediastinum: Secondary | ICD-10-CM

## 2016-11-10 NOTE — Progress Notes (Signed)
      HPI: Patient returns for routine postoperative follow-up having undergone right thoracotomy for drainage of an extensive mediastinal abscess and right empyema on 10/17/2016. The patient's early postoperative recovery while in the hospital was notable for an uncomplicated course. A follow up esophagram showed no sign of esophageal leak and her diet was advanced without difficulty. Since hospital discharge the patient reports that she was on IV antibiotics and now transitioned to oral Augmentin. She reports that her appetite has been poor. She has no difficulty with swallowing but is not hungry.   Current Outpatient Prescriptions  Medication Sig Dispense Refill  . amoxicillin-clavulanate (AUGMENTIN) 875-125 MG tablet Take 1 tablet by mouth 2 (two) times daily. 28 tablet 0  . metoprolol tartrate (LOPRESSOR) 25 MG tablet Take 1 tablet (25 mg total) by mouth 2 (two) times daily. 30 tablet 1  . oxyCODONE (ROXICODONE) 5 MG immediate release tablet Take 1 tablet (5 mg total) by mouth every 6 (six) hours as needed for severe pain. 30 tablet 0  . potassium chloride SA (K-DUR,KLOR-CON) 20 MEQ tablet Take 1 tablet (20 mEq total) by mouth 2 (two) times daily. 30 tablet 1  . amLODipine (NORVASC) 5 MG tablet Take 5 mg by mouth daily after breakfast.  1  . lisinopril-hydrochlorothiazide (PRINZIDE,ZESTORETIC) 20-25 MG tablet Take 1 tablet by mouth daily after breakfast.  1   No current facility-administered medications for this visit.     Physical Exam: BP 138/83 (BP Location: Left Arm, Patient Position: Sitting, Cuff Size: Large)   Pulse 75 Comment: ON RA  Resp 18   Ht 5\' 2"  (1.575 m)   Wt 215 lb (97.5 kg)   BMI 39.32 kg/m  She looks well. She has lost some weight.  Cardiac exam shows a regular rate and rhythm with normal heart sounds Lungs are clear The right thoracotomy incision is well-healed  Diagnostic Tests:  Study Result   CLINICAL DATA:  Post thoracotomy, right  VATS  EXAM: CHEST  2 VIEW  COMPARISON:  10/26/2016  FINDINGS: Small right pleural effusion. No pneumothorax. Right base atelectasis. Tracheostomy is unchanged. Right PICC line has been removed. Minimal left base atelectasis. Heart is normal size.  IMPRESSION: Blunting of the right costophrenic angle, stable, compatible with small right effusion. Bibasilar atelectasis. No pneumothorax.   Electronically Signed   By: Rolm Baptise M.D.   On: 11/10/2016 11:11      Impression:  She is doing well overall with no sign of infection. Her only complaint is of poor appetite which may be related to the Augmentin which she has been taking on an empty stomach. I encouraged her to try to eat something before taking the antibiotic. Hopefully she will be able to get off the antibiotic soon. It is not clear where the mediastinal abscess originated from but the bacteria would suggest that it may have been from an oropharyngeal source, possible a dental abscess. She does not have any pain in her teeth or jaw but did prior to developing the abscess. She is going to follow up with her dentist.  Plan:  I will see her back in the office in 3 weeks with a CXR to see how she is progressing   Gaye Pollack, MD [facility] [phone]

## 2016-11-24 ENCOUNTER — Ambulatory Visit (INDEPENDENT_AMBULATORY_CARE_PROVIDER_SITE_OTHER): Payer: Medicaid Other | Admitting: Internal Medicine

## 2016-11-24 DIAGNOSIS — J853 Abscess of mediastinum: Secondary | ICD-10-CM

## 2016-11-25 ENCOUNTER — Encounter: Payer: Self-pay | Admitting: Internal Medicine

## 2016-11-25 NOTE — Assessment & Plan Note (Signed)
I strongly believe that her abscess and right pleural empyema have now been cured with a combination of surgery and nearly 2 months of antibiotic therapy. She will continue off of antibiotics. She can follow-up here as needed

## 2016-11-25 NOTE — Progress Notes (Signed)
[facility] for Infectious Disease  Patient Active Problem List   Diagnosis Date Noted  . Pleural empyema ([place]) 10/21/2016    Priority: High  . 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 Esophageal perforation 10/02/2016    Priority: High  . S/P thoracotomy 10/15/2016  . INSOMNIA 04/22/2009  . HYPERLIPIDEMIA 06/12/2007  . ALLERGIC RHINITIS 06/12/2007  . HYPERTENSION 04/19/2001    Patient's Medications  New Prescriptions   No medications on file  Previous Medications   AMLODIPINE (NORVASC) 5 MG TABLET    Take 5 mg by mouth daily after breakfast.   AMOXICILLIN-CLAVULANATE (AUGMENTIN) 875-125 MG TABLET    Take 1 tablet by mouth 2 (two) times daily.   LISINOPRIL-HYDROCHLOROTHIAZIDE (PRINZIDE,ZESTORETIC) 20-25 MG TABLET    Take 1 tablet by mouth daily after breakfast.   METOPROLOL TARTRATE (LOPRESSOR) 25 MG TABLET    Take 1 tablet (25 mg total) by mouth 2 (two) times daily.   OXYCODONE (ROXICODONE) 5 MG IMMEDIATE RELEASE TABLET    Take 1 tablet (5 mg total) by mouth every 6 (six) hours as needed for severe pain.   POTASSIUM CHLORIDE SA (K-DUR,KLOR-CON) 20 MEQ TABLET    Take 1 tablet (20 mEq total) by mouth 2 (two) times daily.  Modified Medications   No medications on file  Discontinued Medications   No medications on file    Subjective: Catherine Erickson is in for her routine follow-up visit. She is now completed daily 2 months of antibiotic therapy for her mediastinal abscess and right pleural empyema that occurred in the setting of probable esophageal perforation. She's had no problems tolerating amoxicillin clavulanate other than some anorexia. She completed antibiotic therapy about one week ago and is feeling better. She has not had any fever, chills, sweats, chest pain, cough or shortness of breath. She is not having any problem swallowing.  Review of Systems: Review of Systems  Constitutional: Negative for chills, diaphoresis, fever, malaise/fatigue  and weight loss.  HENT: Negative for sore throat.   Respiratory: Negative for cough, sputum production and shortness of breath.   Cardiovascular: Negative for chest pain.  Gastrointestinal: Negative for abdominal pain, diarrhea, heartburn, nausea and vomiting.  Genitourinary: Negative for dysuria and frequency.  Musculoskeletal: Negative for joint pain and myalgias.  Skin: Negative for rash.  Neurological: Negative for dizziness and headaches.    No past medical history on file.  Social History  Substance Use Topics  . Smoking status: Former Research scientist (life sciences)  . Smokeless tobacco: Never Used  . Alcohol use No    No family history on file.  No Known Allergies  Objective: Vitals:   11/25/16 0817  BP: 139/79  Pulse: 63  Temp: 98.1 F (36.7 C)  TempSrc: Oral  Weight: 217 lb (98.4 kg)   Body mass index is 39.69 kg/m.  Physical Exam  Constitutional: She is oriented to person, place, and time.  She is in good spirits. She is accompanied by her daughter.  HENT:  Mouth/Throat: No oropharyngeal exudate.  Eyes: Conjunctivae are normal.  Cardiovascular: Normal rate and regular rhythm.   No murmur heard. Pulmonary/Chest: Effort normal and breath sounds normal. She has no wheezes. She has no rales.  She has a tracheostomy tube in place.  Abdominal: Soft. She exhibits no mass. There is no tenderness.  Musculoskeletal: Normal range of motion.  Neurological: She is alert and oriented to person, place, and time.  Skin: No rash noted.  Psychiatric: Mood and  affect normal.    Lab Results    Problem List Items Addressed This Visit    None       Michel Bickers, MD [facility] for [place] [phone] pager   [phone] cell 11/25/2016, 8:21 AM

## 2016-11-29 ENCOUNTER — Other Ambulatory Visit: Payer: Self-pay | Admitting: Surgery

## 2016-11-29 DIAGNOSIS — Z9889 Other specified postprocedural states: Secondary | ICD-10-CM

## 2016-12-01 ENCOUNTER — Encounter: Payer: Self-pay | Admitting: Surgery

## 2016-12-01 ENCOUNTER — Ambulatory Visit (INDEPENDENT_AMBULATORY_CARE_PROVIDER_SITE_OTHER): Payer: Self-pay | Admitting: Surgery

## 2016-12-01 ENCOUNTER — Ambulatory Visit
Admission: RE | Admit: 2016-12-01 | Discharge: 2016-12-01 | Disposition: A | Discharge status: Home / Self Care | Payer: Medicaid Other | Source: Ambulatory Visit | Attending: Cardiothoracic Surgery | Admitting: Cardiothoracic Surgery

## 2016-12-01 VITALS — BP 104/64 | HR 102 | Resp 20 | Ht 62.0 in | Wt 209.0 lb

## 2016-12-01 DIAGNOSIS — J869 Pyothorax without fistula: Secondary | ICD-10-CM

## 2016-12-01 DIAGNOSIS — Z9889 Other specified postprocedural states: Secondary | ICD-10-CM

## 2016-12-01 DIAGNOSIS — J853 Abscess of mediastinum: Secondary | ICD-10-CM

## 2016-12-01 DIAGNOSIS — Z09 Encounter for follow-up examination after completed treatment for conditions other than malignant neoplasm: Secondary | ICD-10-CM

## 2016-12-01 NOTE — Progress Notes (Signed)
     HPI:  The patient returns today for followup having undergone right thoracotomy for drainage of an extensive mediastinal abscess and right empyema on 10/17/2016. The patient's early postoperative recovery while in the hospital was notable for an uncomplicated course. A follow up esophagram showed no sign of esophageal leak and her diet was advanced without difficulty. She went home to complete her antibiotic course. She has continued to do well and remains afebrile. Her appetite is improved and she is swallowing without difficulty.   Current Outpatient Prescriptions  Medication Sig Dispense Refill  . lisinopril-hydrochlorothiazide (PRINZIDE,ZESTORETIC) 20-25 MG tablet Take 1 tablet by mouth daily after breakfast.  1  . rosuvastatin (CRESTOR) 20 MG tablet Take 20 mg by mouth daily.     No current facility-administered medications for this visit.      Physical Exam: BP 104/64   Pulse (!) 102   Resp 20   Ht 5\' 2"  (1.575 m)   Wt 209 lb (94.8 kg)   SpO2 99% Comment: RA  BMI 38.23 kg/m  She looks well. She has lost some weight.  Cardiac exam shows a regular rate and rhythm with normal heart sounds Lungs are clear The right thoracotomy incision is well-healed   Diagnostic Tests:  CLINICAL DATA:  Small right pleural effusion.  EXAM: CHEST  2 VIEW  COMPARISON:  11/10/2016 and 10/26/2016  FINDINGS: There is decreased residual blunting of the right costophrenic angle laterally. No discrete effusion visible on the lateral view.  Lung bases are now clear. Heart size and vascularity are normal. Tracheostomy tube in place, unchanged.  IMPRESSION: Minimal residual blunting of the right costophrenic angle laterally. The lungs are now clear.   Electronically Signed   By: Lorriane Shire M.D.   On: 12/01/2016 11:20   Impression:  She has made a good recovery from her surgery. It is not clear where the mediastinal abscess originated from but the bacteria would  suggest that it may have been from an oropharyngeal source, possible a dental abscess. She currently had no tooth or jaw pain. I don't think she requires any further follow up for this problem.  Plan:  She will return to see me if she develops any problems with her incision.   Gaye Pollack, MD [facility] [phone]

## 2016-12-05 ENCOUNTER — Emergency Department (HOSPITAL_COMMUNITY): Payer: Medicaid Other

## 2016-12-05 ENCOUNTER — Encounter (HOSPITAL_COMMUNITY): Payer: Self-pay | Admitting: Emergency Medicine

## 2016-12-05 ENCOUNTER — Emergency Department (HOSPITAL_COMMUNITY)
Admission: EM | Admit: 2016-12-05 | Discharge: 2016-12-05 | Disposition: A | Discharge status: Home / Self Care | Payer: Medicaid Other | Attending: Emergency Medicine | Admitting: Emergency Medicine

## 2016-12-05 DIAGNOSIS — I10 Essential (primary) hypertension: Secondary | ICD-10-CM | POA: Insufficient documentation

## 2016-12-05 DIAGNOSIS — R112 Nausea with vomiting, unspecified: Secondary | ICD-10-CM | POA: Insufficient documentation

## 2016-12-05 DIAGNOSIS — E785 Hyperlipidemia, unspecified: Secondary | ICD-10-CM | POA: Insufficient documentation

## 2016-12-05 DIAGNOSIS — R1010 Upper abdominal pain, unspecified: Secondary | ICD-10-CM | POA: Diagnosis present

## 2016-12-05 DIAGNOSIS — Z87891 Personal history of nicotine dependence: Secondary | ICD-10-CM | POA: Diagnosis not present

## 2016-12-05 DIAGNOSIS — R197 Diarrhea, unspecified: Secondary | ICD-10-CM | POA: Insufficient documentation

## 2016-12-05 DIAGNOSIS — Z79899 Other long term (current) drug therapy: Secondary | ICD-10-CM | POA: Insufficient documentation

## 2016-12-05 DIAGNOSIS — A0472 Enterocolitis due to Clostridium difficile, not specified as recurrent: Secondary | ICD-10-CM | POA: Insufficient documentation

## 2016-12-05 HISTORY — DX: Malignant (primary) neoplasm, unspecified: C80.1

## 2016-12-05 LAB — URINALYSIS, ROUTINE W REFLEX MICROSCOPIC
BILIRUBIN URINE: NEGATIVE
Bacteria, UA: NONE SEEN
GLUCOSE, UA: NEGATIVE mg/dL
HGB URINE DIPSTICK: NEGATIVE
Ketones, ur: NEGATIVE mg/dL
NITRITE: NEGATIVE
Protein, ur: 30 mg/dL — AB
SPECIFIC GRAVITY, URINE: 1.039 — AB (ref 1.005–1.030)
pH: 5 (ref 5.0–8.0)

## 2016-12-05 LAB — COMPREHENSIVE METABOLIC PANEL
ALBUMIN: 3.5 g/dL (ref 3.5–5.0)
ALT: 11 U/L — ABNORMAL LOW (ref 14–54)
ANION GAP: 10 (ref 5–15)
AST: 15 U/L (ref 15–41)
Alkaline Phosphatase: 70 U/L (ref 38–126)
BUN: 12 mg/dL (ref 6–20)
CALCIUM: 9.3 mg/dL (ref 8.9–10.3)
CO2: 26 mmol/L (ref 22–32)
CREATININE: 1.47 mg/dL — AB (ref 0.44–1.00)
Chloride: 105 mmol/L (ref 101–111)
GFR calc non Af Amer: 38 mL/min — ABNORMAL LOW (ref >60)
GFR, EST AFRICAN AMERICAN: 44 mL/min — AB (ref >60)
GLUCOSE: 117 mg/dL — AB (ref 65–99)
Potassium: 3.4 mmol/L — ABNORMAL LOW (ref 3.5–5.1)
Sodium: 141 mmol/L (ref 135–145)
TOTAL PROTEIN: 8.3 g/dL — AB (ref 6.5–8.1)
Total Bilirubin: 0.6 mg/dL (ref 0.3–1.2)

## 2016-12-05 LAB — CBC
HCT: 38 % (ref 36.0–46.0)
Hemoglobin: 13.3 g/dL (ref 12.0–15.0)
MCH: 27.8 pg (ref 26.0–34.0)
MCHC: 35 g/dL (ref 30.0–36.0)
MCV: 79.3 fL (ref 78.0–100.0)
PLATELETS: 528 10*3/uL — AB (ref 150–400)
RBC: 4.79 MIL/uL (ref 3.87–5.11)
RDW: 14.9 % (ref 11.5–15.5)
WBC: 18.5 10*3/uL — ABNORMAL HIGH (ref 4.0–10.5)

## 2016-12-05 LAB — LIPASE, BLOOD: LIPASE: 30 U/L (ref 11–51)

## 2016-12-05 MED ORDER — ONDANSETRON 4 MG PO TBDP: 4.0000 mg | ORAL_TABLET | Freq: Three times a day (TID) | ORAL | 0 refills | Status: DC | PRN

## 2016-12-05 MED ORDER — IOPAMIDOL (ISOVUE-300) INJECTION 61%
75.0000 mL | Freq: Once | INTRAVENOUS | Status: AC | PRN
  Administered 2016-12-05: 75 mL via INTRAVENOUS

## 2016-12-05 MED ORDER — HYDROCODONE-ACETAMINOPHEN 5-325 MG PO TABS: 1.0000 | ORAL_TABLET | ORAL | 0 refills | Status: DC | PRN

## 2016-12-05 MED ORDER — METRONIDAZOLE 500 MG PO TABS
500.0000 mg | ORAL_TABLET | Freq: Once | ORAL | Status: AC
  Administered 2016-12-05: 500 mg via ORAL
  Filled 2016-12-05: qty 1

## 2016-12-05 MED ORDER — ONDANSETRON HCL 4 MG/2ML IJ SOLN
4.0000 mg | Freq: Once | INTRAMUSCULAR | Status: AC
  Administered 2016-12-05: 4 mg via INTRAVENOUS
  Filled 2016-12-05: qty 2

## 2016-12-05 MED ORDER — MORPHINE SULFATE (PF) 2 MG/ML IV SOLN
4.0000 mg | Freq: Once | INTRAVENOUS | Status: AC
  Administered 2016-12-05: 4 mg via INTRAVENOUS
  Filled 2016-12-05: qty 2

## 2016-12-05 MED ORDER — SODIUM CHLORIDE 0.9 % IV BOLUS (SEPSIS)
1000.0000 mL | Freq: Once | INTRAVENOUS | Status: AC
  Administered 2016-12-05: 1000 mL via INTRAVENOUS

## 2016-12-05 MED ORDER — METRONIDAZOLE 500 MG PO TABS: 500.0000 mg | ORAL_TABLET | Freq: Two times a day (BID) | ORAL | 0 refills | Status: DC

## 2016-12-05 MED ORDER — SOLUBLE FIBER/PROBIOTICS PO CHEW: 1.0000 | CHEWABLE_TABLET | Freq: Two times a day (BID) | ORAL | 0 refills | Status: AC

## 2016-12-05 MED ORDER — IOPAMIDOL (ISOVUE-300) INJECTION 61%
INTRAVENOUS | Status: AC
  Filled 2016-12-05: qty 75

## 2016-12-05 NOTE — ED Notes (Signed)
Pt made aware of need of urine specimen. Pt denied having to urinate at this time. RN aware.

## 2016-12-05 NOTE — ED Provider Notes (Signed)
[facility] DEPT Provider Note   CSN: [id number] Arrival date & time: 12/05/16  2119     History   Chief Complaint Chief Complaint  Patient presents with  . Abdominal Pain    upper  . Emesis  . Diarrhea    HPI Catherine Erickson is a 61 y.o. female.  Pt presents to the ED today with abdominal pain and n/v/d.  The pt said the abdominal pain has been going on for about 2 weeks.  Pt started having n/v/d last night.    Pt has an extensive past medical hx.  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  She presented on 10/02/2016 with sudden onset of chest pain, back pain, fever and shortness of breath. She had no preceding emesis or foreign body ingestion. She had a CT of the chest that showed extensive mediastinal emphysema around the esophagus. She subsequently had a repeat study after oral contrast and there was no extravasation but still high suspicion for esophageal perforation. She was taken to the OR by Dr. Benson Norway and Dr. Roxan Hockey and had an EGD that showed a tight esophageal stricture about 2-3 cm distal to the UES that was dilated. No perforation was seen. Bronchoscopy was done by Dr. Roxan Hockey and did not show any tracheobroncheal injury. A decision was made to treat conservatively with antibiotics and observation and she apparently felt better and was started on a diet. She was discharged on 10/12/2016 on oral antibiotic.  She presented back to the ED on 6/29 due to worsening of sx.  A repeat CT scan was obtained and showed a new large mediastinal abscess and right empyema with right lung collapse.   She was taken emergently to the OR by Dr. Cyndia Bent on the 29th. She underwent Right Thoracotomy with drainage of empyema, mediastinal abscess and decortication of the right lung.  She tolerated the procedure without difficulty and was taken to the SICU in stable condition. During her stay in the SICU the patient was on Vancomycin and Zosyn and pending final  cultures.  She was weaned off Levophed as tolerated.  She developed some blood loss which appeared to be vaginal in nature.  She was transfused 1 unit of packed cells.  She also received lasix post blood transfusion.  POD #3 Esophagram was performed and showed no evidence of leak.  POD #4 one of her chest tubes were removed.  She will require long term antibiotics and PICC line was placed.  She was started on a liquid diet which was progressed slowly. OR cultures grew strep anginosis.  ID consult was obtained and they adjusted her antibiotic regimen and duration of therapy.  She will remain on IV Ertapenem with a stop date of 11/04/2016.          Past Medical History:  Diagnosis Date  . Cancer [facility])     Patient Active Problem List   Diagnosis Date Noted  . Pleural empyema ([place]) 10/21/2016  . Mediastinal abscess ([place]) 10/15/2016  . S/P thoracotomy 10/15/2016  . Esophageal perforation 10/02/2016  . INSOMNIA 04/22/2009  . HYPERLIPIDEMIA 06/12/2007  . ALLERGIC RHINITIS 06/12/2007  . HYPERTENSION 04/19/2001    Past Surgical History:  Procedure Laterality Date  . ESOPHAGOSCOPY Right 10/02/2016   Procedure: ESOPHAGOSCOPY;  Surgeon: Carol Ada, MD;  Location: [place];  Service: Endoscopy;  Laterality: Right;  . IR FLUORO GUIDE CV LINE RIGHT  10/22/2016  . TRACHEOSTOMY N/A   . VIDEO ASSISTED THORACOSCOPY (VATS)/EMPYEMA  Right 10/15/2016   Procedure: THOROCOTOMY DRAINAGE OF MEDIASTINAL ABSCESS AND EMPYEMA;  Surgeon: Gaye Pollack, MD;  Location: [place];  Service: Thoracic;  Laterality: Right;  Marland Kitchen VIDEO BRONCHOSCOPY N/A 10/02/2016   Procedure: VIDEO BRONCHOSCOPY;  Surgeon: Melrose Nakayama, MD;  Location: [facility] OR;  Service: Thoracic;  Laterality: N/A;    OB History    Gravida Para Term Preterm AB Living   5 5       5    SAB TAB Ectopic Multiple Live Births                   Home Medications    Prior to Admission medications   Medication Sig Start Date End Date Taking? Authorizing  Provider  HYDROcodone-acetaminophen (NORCO/VICODIN) 5-325 MG tablet Take 1 tablet by mouth every 4 (four) hours as needed. 12/05/16   Isla Pence, MD  lisinopril-hydrochlorothiazide (PRINZIDE,ZESTORETIC) 20-25 MG tablet Take 1 tablet by mouth daily after breakfast. 08/09/16   [provider]  metroNIDAZOLE (FLAGYL) 500 MG tablet Take 1 tablet (500 mg total) by mouth 2 (two) times daily. 12/05/16   Isla Pence, MD  ondansetron (ZOFRAN ODT) 4 MG disintegrating tablet Take 1 tablet (4 mg total) by mouth every 8 (eight) hours as needed. 12/05/16   Isla Pence, MD  Probiotic Product (SOLUBLE FIBER/PROBIOTICS) CHEW Chew 1 capsule by mouth 2 (two) times daily. 12/05/16   Isla Pence, MD  rosuvastatin (CRESTOR) 20 MG tablet Take 20 mg by mouth daily.    [provider]    Family History History reviewed. No pertinent family history.  Social History Social History  Substance Use Topics  . Smoking status: Former Research scientist (life sciences)  . Smokeless tobacco: Never Used  . Alcohol use No     Allergies   Patient has no known allergies.   Review of Systems Review of Systems  Gastrointestinal: Positive for abdominal pain, diarrhea, nausea and vomiting.  All other systems reviewed and are negative.    Physical Exam Updated Vital Signs BP (!) 163/90   Pulse 85   Temp 98.5 F (36.9 C)   Resp 16   SpO2 98%   Physical Exam  Constitutional: She is oriented to person, place, and time. She appears well-developed and well-nourished.  HENT:  Head: Normocephalic and atraumatic.  Right Ear: External ear normal.  Left Ear: External ear normal.  Nose: Nose normal.  Mouth/Throat: Oropharynx is clear and moist.  Eyes: Pupils are equal, round, and reactive to light. Conjunctivae and EOM are normal.  Neck: Normal range of motion. Neck supple.    Cardiovascular: Normal rate, regular rhythm, normal heart sounds and intact distal pulses.   Pulmonary/Chest: Effort normal and breath  sounds normal.  Abdominal: Soft. There is generalized tenderness.  Musculoskeletal: Normal range of motion.  Neurological: She is alert and oriented to person, place, and time.  Skin: Skin is warm.  Psychiatric: She has a normal mood and affect. Her behavior is normal. Judgment and thought content normal.  Nursing note and vitals reviewed.    ED Treatments / Results  Labs (all labs ordered are listed, but only abnormal results are displayed) Labs Reviewed  COMPREHENSIVE METABOLIC PANEL - Abnormal; Notable for the following:       Result Value   Potassium 3.4 (*)    Glucose, Bld 117 (*)    Creatinine, Ser 1.47 (*)    Total Protein 8.3 (*)    ALT 11 (*)    GFR calc non Af Amer 38 (*)  GFR calc Af Amer 44 (*)    All other components within normal limits  CBC - Abnormal; Notable for the following:    WBC 18.5 (*)    Platelets 528 (*)    All other components within normal limits  URINALYSIS, ROUTINE W REFLEX MICROSCOPIC - Abnormal; Notable for the following:    Specific Gravity, Urine 1.039 (*)    Protein, ur 30 (*)    Leukocytes, UA TRACE (*)    Squamous Epithelial / LPF 0-5 (*)    All other components within normal limits  GASTROINTESTINAL PANEL BY PCR, STOOL (REPLACES STOOL CULTURE)  C DIFFICILE QUICK SCREEN W PCR REFLEX  LIPASE, BLOOD    EKG  EKG Interpretation None       Radiology Ct Abdomen Pelvis W Contrast  Result Date: 12/05/2016 CLINICAL DATA:  Upper abdominal pain for 2 weeks. EXAM: CT ABDOMEN AND PELVIS WITH CONTRAST TECHNIQUE: Multidetector CT imaging of the abdomen and pelvis was performed using the standard protocol following bolus administration of intravenous contrast. CONTRAST:  ISOVUE-300 IOPAMIDOL (ISOVUE-300) INJECTION 61% COMPARISON:  None. FINDINGS: Lower chest: No acute abnormality. Hepatobiliary: No focal liver abnormality is seen. No intrahepatic or extrahepatic biliary ductal dilatation. Multiple cholelithiasis. Pancreas: Unremarkable. No  pancreatic ductal dilatation or surrounding inflammatory changes. Spleen: Normal in size without focal abnormality. Adrenals/Urinary Tract: Two 10 mm hypodense right renal masses most consistent with cysts. 18 mm hypodense left inferior pole renal mass most consistent with a cyst. No obstructive uropathy. Normal bladder. Stomach/Bowel: No bowel dilatation to suggest obstruction. Bowel wall thickening involving the descending colon and possibly transverse colon most severe in the distal descending colon most concerning for colitis secondary to an infectious or inflammatory etiology. Vascular/Lymphatic: Normal caliber abdominal aorta. No lymphadenopathy. Reproductive: Uterus and bilateral adnexa are unremarkable. Other: No abdominal wall hernia or abnormality. No abdominopelvic ascites. Musculoskeletal: No acute or significant osseous findings. IMPRESSION: 1. Bowel wall thickening involving the descending colon and possibly transverse colon most severe in the distal descending colon most concerning for colitis secondary to an infectious or inflammatory etiology. Electronically Signed   By: Kathreen Devoid   On: 12/05/2016 12:43    Procedures Procedures (including critical care time)  Medications Ordered in ED Medications  ondansetron (ZOFRAN) injection 4 mg (4 mg Intravenous Given 12/05/16 1159)  morphine 2 MG/ML injection 4 mg (4 mg Intravenous Given 12/05/16 1159)  iopamidol (ISOVUE-300) 61 % injection 75 mL (75 mLs Intravenous Contrast Given 12/05/16 1218)  sodium chloride 0.9 % bolus 1,000 mL (1,000 mLs Intravenous New Bag/Given 12/05/16 1323)  metroNIDAZOLE (FLAGYL) tablet 500 mg (500 mg Oral Given 12/05/16 1322)     Initial Impression / Assessment and Plan / ED Course  I have reviewed the triage vital signs and the nursing notes.  Pertinent labs & imaging results that were available during my care of the patient were reviewed by me and considered in my medical decision making (see chart for  details).    Pt feels much better.  She was offered admission, but wants to go home.  Pt unable to produce a stool here, but I strongly suspect c.diff as the cause of her colitis due to all her recent abx.  Pt will be given rx for probiotics and for flagyl.  She knows to return if worse.  Final Clinical Impressions(s) / ED Diagnoses   Final diagnoses:  Clostridium difficile colitis    New Prescriptions New Prescriptions   HYDROCODONE-ACETAMINOPHEN (NORCO/VICODIN) 5-325 MG TABLET    Take  1 tablet by mouth every 4 (four) hours as needed.   METRONIDAZOLE (FLAGYL) 500 MG TABLET    Take 1 tablet (500 mg total) by mouth 2 (two) times daily.   ONDANSETRON (ZOFRAN ODT) 4 MG DISINTEGRATING TABLET    Take 1 tablet (4 mg total) by mouth every 8 (eight) hours as needed.   PROBIOTIC PRODUCT (SOLUBLE FIBER/PROBIOTICS) CHEW    Chew 1 capsule by mouth 2 (two) times daily.     Isla Pence, MD 12/05/16 1434

## 2016-12-05 NOTE — ED Triage Notes (Signed)
Pt c/o upper abdominal pain x 2 weeks. Pt's daugher reports that patient had been hospitalized for chest abscess approximately 1 month ago and on antibiotics for several weeks. Pt completed antibiotics 2 weeks ago and developed abdominal pain. Pt recently started with emesis and diarrhea. No fevers.

## 2016-12-05 NOTE — ED Notes (Signed)
Patient transported to CT 

## 2016-12-15 ENCOUNTER — Other Ambulatory Visit: Payer: Self-pay | Admitting: Physician Assistant

## 2017-01-27 ENCOUNTER — Emergency Department (HOSPITAL_COMMUNITY)
Admission: EM | Admit: 2017-01-27 | Discharge: 2017-01-27 | Disposition: A | Discharge status: Home / Self Care | Payer: No Typology Code available for payment source | Attending: Emergency Medicine | Admitting: Emergency Medicine

## 2017-01-27 ENCOUNTER — Encounter (HOSPITAL_COMMUNITY): Payer: Self-pay | Admitting: Emergency Medicine

## 2017-01-27 DIAGNOSIS — Z87891 Personal history of nicotine dependence: Secondary | ICD-10-CM | POA: Insufficient documentation

## 2017-01-27 DIAGNOSIS — Z79899 Other long term (current) drug therapy: Secondary | ICD-10-CM | POA: Insufficient documentation

## 2017-01-27 DIAGNOSIS — I10 Essential (primary) hypertension: Secondary | ICD-10-CM | POA: Diagnosis not present

## 2017-01-27 DIAGNOSIS — M545 Low back pain, unspecified: Secondary | ICD-10-CM

## 2017-01-27 DIAGNOSIS — Z859 Personal history of malignant neoplasm, unspecified: Secondary | ICD-10-CM | POA: Insufficient documentation

## 2017-01-27 MED ORDER — NAPROXEN 500 MG PO TABS: 500.0000 mg | ORAL_TABLET | Freq: Two times a day (BID) | ORAL | 0 refills | Status: AC

## 2017-01-27 MED ORDER — CYCLOBENZAPRINE HCL 10 MG PO TABS: 10.0000 mg | ORAL_TABLET | Freq: Two times a day (BID) | ORAL | 0 refills | Status: AC | PRN
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
## 2017-04-19 DIAGNOSIS — J449 Chronic obstructive pulmonary disease, unspecified: Secondary | ICD-10-CM | POA: Diagnosis not present

## 2017-04-19 DIAGNOSIS — Z930 Tracheostomy status: Secondary | ICD-10-CM | POA: Diagnosis not present

## 2017-05-14 DIAGNOSIS — Z930 Tracheostomy status: Secondary | ICD-10-CM | POA: Diagnosis not present

## 2017-05-14 DIAGNOSIS — J449 Chronic obstructive pulmonary disease, unspecified: Secondary | ICD-10-CM | POA: Diagnosis not present

## 2017-05-20 DIAGNOSIS — J449 Chronic obstructive pulmonary disease, unspecified: Secondary | ICD-10-CM | POA: Diagnosis not present

## 2017-05-20 DIAGNOSIS — Z930 Tracheostomy status: Secondary | ICD-10-CM | POA: Diagnosis not present

## 2017-06-14 DIAGNOSIS — J449 Chronic obstructive pulmonary disease, unspecified: Secondary | ICD-10-CM | POA: Diagnosis not present

## 2017-06-14 DIAGNOSIS — Z930 Tracheostomy status: Secondary | ICD-10-CM | POA: Diagnosis not present

## 2017-06-17 DIAGNOSIS — Z930 Tracheostomy status: Secondary | ICD-10-CM | POA: Diagnosis not present

## 2017-06-17 DIAGNOSIS — J449 Chronic obstructive pulmonary disease, unspecified: Secondary | ICD-10-CM | POA: Diagnosis not present

## 2017-07-12 DIAGNOSIS — J449 Chronic obstructive pulmonary disease, unspecified: Secondary | ICD-10-CM | POA: Diagnosis not present

## 2017-07-12 DIAGNOSIS — Z930 Tracheostomy status: Secondary | ICD-10-CM | POA: Diagnosis not present

## 2017-07-18 DIAGNOSIS — Z930 Tracheostomy status: Secondary | ICD-10-CM | POA: Diagnosis not present

## 2017-07-18 DIAGNOSIS — J449 Chronic obstructive pulmonary disease, unspecified: Secondary | ICD-10-CM | POA: Diagnosis not present

## 2017-08-12 DIAGNOSIS — J449 Chronic obstructive pulmonary disease, unspecified: Secondary | ICD-10-CM | POA: Diagnosis not present

## 2017-08-12 DIAGNOSIS — Z930 Tracheostomy status: Secondary | ICD-10-CM | POA: Diagnosis not present

## 2017-08-17 DIAGNOSIS — J449 Chronic obstructive pulmonary disease, unspecified: Secondary | ICD-10-CM | POA: Diagnosis not present

## 2017-08-17 DIAGNOSIS — Z930 Tracheostomy status: Secondary | ICD-10-CM | POA: Diagnosis not present

## 2017-10-17 DIAGNOSIS — I10 Essential (primary) hypertension: Secondary | ICD-10-CM | POA: Diagnosis not present

## 2017-10-17 DIAGNOSIS — E662 Morbid (severe) obesity with alveolar hypoventilation: Secondary | ICD-10-CM | POA: Diagnosis not present

## 2017-10-17 DIAGNOSIS — J45909 Unspecified asthma, uncomplicated: Secondary | ICD-10-CM | POA: Diagnosis not present

## 2017-10-18 DIAGNOSIS — J45909 Unspecified asthma, uncomplicated: Secondary | ICD-10-CM | POA: Diagnosis not present

## 2017-10-18 DIAGNOSIS — I10 Essential (primary) hypertension: Secondary | ICD-10-CM | POA: Diagnosis not present

## 2017-10-18 DIAGNOSIS — E662 Morbid (severe) obesity with alveolar hypoventilation: Secondary | ICD-10-CM | POA: Diagnosis not present

## 2017-10-19 DIAGNOSIS — E662 Morbid (severe) obesity with alveolar hypoventilation: Secondary | ICD-10-CM | POA: Diagnosis not present

## 2017-10-19 DIAGNOSIS — J45909 Unspecified asthma, uncomplicated: Secondary | ICD-10-CM | POA: Diagnosis not present

## 2017-10-19 DIAGNOSIS — I10 Essential (primary) hypertension: Secondary | ICD-10-CM | POA: Diagnosis not present

## 2017-10-20 DIAGNOSIS — J45909 Unspecified asthma, uncomplicated: Secondary | ICD-10-CM | POA: Diagnosis not present

## 2017-10-20 DIAGNOSIS — I10 Essential (primary) hypertension: Secondary | ICD-10-CM | POA: Diagnosis not present

## 2017-10-20 DIAGNOSIS — E662 Morbid (severe) obesity with alveolar hypoventilation: Secondary | ICD-10-CM | POA: Diagnosis not present

## 2017-10-21 DIAGNOSIS — J45909 Unspecified asthma, uncomplicated: Secondary | ICD-10-CM | POA: Diagnosis not present

## 2017-10-21 DIAGNOSIS — I10 Essential (primary) hypertension: Secondary | ICD-10-CM | POA: Diagnosis not present

## 2017-10-21 DIAGNOSIS — E662 Morbid (severe) obesity with alveolar hypoventilation: Secondary | ICD-10-CM | POA: Diagnosis not present

## 2017-10-22 DIAGNOSIS — J45909 Unspecified asthma, uncomplicated: Secondary | ICD-10-CM | POA: Diagnosis not present

## 2017-10-22 DIAGNOSIS — I10 Essential (primary) hypertension: Secondary | ICD-10-CM | POA: Diagnosis not present

## 2017-10-22 DIAGNOSIS — E662 Morbid (severe) obesity with alveolar hypoventilation: Secondary | ICD-10-CM | POA: Diagnosis not present

## 2017-10-23 DIAGNOSIS — J45909 Unspecified asthma, uncomplicated: Secondary | ICD-10-CM | POA: Diagnosis not present

## 2017-10-23 DIAGNOSIS — E662 Morbid (severe) obesity with alveolar hypoventilation: Secondary | ICD-10-CM | POA: Diagnosis not present

## 2017-10-23 DIAGNOSIS — I10 Essential (primary) hypertension: Secondary | ICD-10-CM | POA: Diagnosis not present

## 2017-10-24 DIAGNOSIS — E662 Morbid (severe) obesity with alveolar hypoventilation: Secondary | ICD-10-CM | POA: Diagnosis not present

## 2017-10-24 DIAGNOSIS — J45909 Unspecified asthma, uncomplicated: Secondary | ICD-10-CM | POA: Diagnosis not present

## 2017-10-24 DIAGNOSIS — I10 Essential (primary) hypertension: Secondary | ICD-10-CM | POA: Diagnosis not present

## 2017-10-25 DIAGNOSIS — E662 Morbid (severe) obesity with alveolar hypoventilation: Secondary | ICD-10-CM | POA: Diagnosis not present

## 2017-10-25 DIAGNOSIS — J45909 Unspecified asthma, uncomplicated: Secondary | ICD-10-CM | POA: Diagnosis not present

## 2017-10-25 DIAGNOSIS — I10 Essential (primary) hypertension: Secondary | ICD-10-CM | POA: Diagnosis not present

## 2017-10-26 DIAGNOSIS — I10 Essential (primary) hypertension: Secondary | ICD-10-CM | POA: Diagnosis not present

## 2017-10-26 DIAGNOSIS — E662 Morbid (severe) obesity with alveolar hypoventilation: Secondary | ICD-10-CM | POA: Diagnosis not present

## 2017-10-26 DIAGNOSIS — J45909 Unspecified asthma, uncomplicated: Secondary | ICD-10-CM | POA: Diagnosis not present

## 2017-10-27 DIAGNOSIS — J45909 Unspecified asthma, uncomplicated: Secondary | ICD-10-CM | POA: Diagnosis not present

## 2017-10-27 DIAGNOSIS — I10 Essential (primary) hypertension: Secondary | ICD-10-CM | POA: Diagnosis not present

## 2017-10-27 DIAGNOSIS — E662 Morbid (severe) obesity with alveolar hypoventilation: Secondary | ICD-10-CM | POA: Diagnosis not present

## 2017-10-28 DIAGNOSIS — J45909 Unspecified asthma, uncomplicated: Secondary | ICD-10-CM | POA: Diagnosis not present

## 2017-10-28 DIAGNOSIS — E662 Morbid (severe) obesity with alveolar hypoventilation: Secondary | ICD-10-CM | POA: Diagnosis not present

## 2017-10-28 DIAGNOSIS — I10 Essential (primary) hypertension: Secondary | ICD-10-CM | POA: Diagnosis not present

## 2017-10-29 DIAGNOSIS — E662 Morbid (severe) obesity with alveolar hypoventilation: Secondary | ICD-10-CM | POA: Diagnosis not present

## 2017-10-29 DIAGNOSIS — I10 Essential (primary) hypertension: Secondary | ICD-10-CM | POA: Diagnosis not present

## 2017-10-29 DIAGNOSIS — J45909 Unspecified asthma, uncomplicated: Secondary | ICD-10-CM | POA: Diagnosis not present

## 2017-10-30 DIAGNOSIS — I10 Essential (primary) hypertension: Secondary | ICD-10-CM | POA: Diagnosis not present

## 2017-10-30 DIAGNOSIS — E662 Morbid (severe) obesity with alveolar hypoventilation: Secondary | ICD-10-CM | POA: Diagnosis not present

## 2017-10-30 DIAGNOSIS — J45909 Unspecified asthma, uncomplicated: Secondary | ICD-10-CM | POA: Diagnosis not present

## 2017-10-31 DIAGNOSIS — J45909 Unspecified asthma, uncomplicated: Secondary | ICD-10-CM | POA: Diagnosis not present

## 2017-10-31 DIAGNOSIS — I10 Essential (primary) hypertension: Secondary | ICD-10-CM | POA: Diagnosis not present

## 2017-10-31 DIAGNOSIS — E662 Morbid (severe) obesity with alveolar hypoventilation: Secondary | ICD-10-CM | POA: Diagnosis not present

## 2017-11-01 DIAGNOSIS — J45909 Unspecified asthma, uncomplicated: Secondary | ICD-10-CM | POA: Diagnosis not present

## 2017-11-01 DIAGNOSIS — I10 Essential (primary) hypertension: Secondary | ICD-10-CM | POA: Diagnosis not present

## 2017-11-01 DIAGNOSIS — E662 Morbid (severe) obesity with alveolar hypoventilation: Secondary | ICD-10-CM | POA: Diagnosis not present

## 2017-11-02 DIAGNOSIS — J45909 Unspecified asthma, uncomplicated: Secondary | ICD-10-CM | POA: Diagnosis not present

## 2017-11-02 DIAGNOSIS — I10 Essential (primary) hypertension: Secondary | ICD-10-CM | POA: Diagnosis not present

## 2017-11-02 DIAGNOSIS — E662 Morbid (severe) obesity with alveolar hypoventilation: Secondary | ICD-10-CM | POA: Diagnosis not present

## 2017-11-03 DIAGNOSIS — I10 Essential (primary) hypertension: Secondary | ICD-10-CM | POA: Diagnosis not present

## 2017-11-03 DIAGNOSIS — E662 Morbid (severe) obesity with alveolar hypoventilation: Secondary | ICD-10-CM | POA: Diagnosis not present

## 2017-11-03 DIAGNOSIS — J45909 Unspecified asthma, uncomplicated: Secondary | ICD-10-CM | POA: Diagnosis not present

## 2017-11-04 DIAGNOSIS — J45909 Unspecified asthma, uncomplicated: Secondary | ICD-10-CM | POA: Diagnosis not present

## 2017-11-04 DIAGNOSIS — E662 Morbid (severe) obesity with alveolar hypoventilation: Secondary | ICD-10-CM | POA: Diagnosis not present

## 2017-11-04 DIAGNOSIS — I10 Essential (primary) hypertension: Secondary | ICD-10-CM | POA: Diagnosis not present

## 2017-11-05 DIAGNOSIS — J45909 Unspecified asthma, uncomplicated: Secondary | ICD-10-CM | POA: Diagnosis not present

## 2017-11-05 DIAGNOSIS — E662 Morbid (severe) obesity with alveolar hypoventilation: Secondary | ICD-10-CM | POA: Diagnosis not present

## 2017-11-05 DIAGNOSIS — I10 Essential (primary) hypertension: Secondary | ICD-10-CM | POA: Diagnosis not present

## 2017-11-06 DIAGNOSIS — I10 Essential (primary) hypertension: Secondary | ICD-10-CM | POA: Diagnosis not present

## 2017-11-06 DIAGNOSIS — J45909 Unspecified asthma, uncomplicated: Secondary | ICD-10-CM | POA: Diagnosis not present

## 2017-11-06 DIAGNOSIS — E662 Morbid (severe) obesity with alveolar hypoventilation: Secondary | ICD-10-CM | POA: Diagnosis not present

## 2017-11-07 DIAGNOSIS — E662 Morbid (severe) obesity with alveolar hypoventilation: Secondary | ICD-10-CM | POA: Diagnosis not present

## 2017-11-07 DIAGNOSIS — I10 Essential (primary) hypertension: Secondary | ICD-10-CM | POA: Diagnosis not present

## 2017-11-07 DIAGNOSIS — J45909 Unspecified asthma, uncomplicated: Secondary | ICD-10-CM | POA: Diagnosis not present

## 2017-11-08 DIAGNOSIS — I10 Essential (primary) hypertension: Secondary | ICD-10-CM | POA: Diagnosis not present

## 2017-11-08 DIAGNOSIS — E662 Morbid (severe) obesity with alveolar hypoventilation: Secondary | ICD-10-CM | POA: Diagnosis not present

## 2017-11-08 DIAGNOSIS — J45909 Unspecified asthma, uncomplicated: Secondary | ICD-10-CM | POA: Diagnosis not present

## 2017-11-09 DIAGNOSIS — J45909 Unspecified asthma, uncomplicated: Secondary | ICD-10-CM | POA: Diagnosis not present

## 2017-11-09 DIAGNOSIS — E662 Morbid (severe) obesity with alveolar hypoventilation: Secondary | ICD-10-CM | POA: Diagnosis not present

## 2017-11-09 DIAGNOSIS — I10 Essential (primary) hypertension: Secondary | ICD-10-CM | POA: Diagnosis not present

## 2017-11-10 DIAGNOSIS — I10 Essential (primary) hypertension: Secondary | ICD-10-CM | POA: Diagnosis not present

## 2017-11-10 DIAGNOSIS — J45909 Unspecified asthma, uncomplicated: Secondary | ICD-10-CM | POA: Diagnosis not present

## 2017-11-10 DIAGNOSIS — E662 Morbid (severe) obesity with alveolar hypoventilation: Secondary | ICD-10-CM | POA: Diagnosis not present

## 2017-11-11 DIAGNOSIS — E662 Morbid (severe) obesity with alveolar hypoventilation: Secondary | ICD-10-CM | POA: Diagnosis not present

## 2017-11-11 DIAGNOSIS — J45909 Unspecified asthma, uncomplicated: Secondary | ICD-10-CM | POA: Diagnosis not present

## 2017-11-11 DIAGNOSIS — I10 Essential (primary) hypertension: Secondary | ICD-10-CM | POA: Diagnosis not present

## 2017-11-12 DIAGNOSIS — J45909 Unspecified asthma, uncomplicated: Secondary | ICD-10-CM | POA: Diagnosis not present

## 2017-11-12 DIAGNOSIS — I10 Essential (primary) hypertension: Secondary | ICD-10-CM | POA: Diagnosis not present

## 2017-11-12 DIAGNOSIS — E662 Morbid (severe) obesity with alveolar hypoventilation: Secondary | ICD-10-CM | POA: Diagnosis not present

## 2017-11-13 DIAGNOSIS — E662 Morbid (severe) obesity with alveolar hypoventilation: Secondary | ICD-10-CM | POA: Diagnosis not present

## 2017-11-13 DIAGNOSIS — I10 Essential (primary) hypertension: Secondary | ICD-10-CM | POA: Diagnosis not present

## 2017-11-13 DIAGNOSIS — J45909 Unspecified asthma, uncomplicated: Secondary | ICD-10-CM | POA: Diagnosis not present

## 2017-11-14 DIAGNOSIS — J45909 Unspecified asthma, uncomplicated: Secondary | ICD-10-CM | POA: Diagnosis not present

## 2017-11-14 DIAGNOSIS — I10 Essential (primary) hypertension: Secondary | ICD-10-CM | POA: Diagnosis not present

## 2017-11-14 DIAGNOSIS — E662 Morbid (severe) obesity with alveolar hypoventilation: Secondary | ICD-10-CM | POA: Diagnosis not present

## 2017-11-15 DIAGNOSIS — J45909 Unspecified asthma, uncomplicated: Secondary | ICD-10-CM | POA: Diagnosis not present

## 2017-11-15 DIAGNOSIS — I10 Essential (primary) hypertension: Secondary | ICD-10-CM | POA: Diagnosis not present

## 2017-11-15 DIAGNOSIS — E662 Morbid (severe) obesity with alveolar hypoventilation: Secondary | ICD-10-CM | POA: Diagnosis not present

## 2017-11-16 DIAGNOSIS — J45909 Unspecified asthma, uncomplicated: Secondary | ICD-10-CM | POA: Diagnosis not present

## 2017-11-16 DIAGNOSIS — E662 Morbid (severe) obesity with alveolar hypoventilation: Secondary | ICD-10-CM | POA: Diagnosis not present

## 2017-11-16 DIAGNOSIS — I10 Essential (primary) hypertension: Secondary | ICD-10-CM | POA: Diagnosis not present

## 2017-11-17 DIAGNOSIS — J45909 Unspecified asthma, uncomplicated: Secondary | ICD-10-CM | POA: Diagnosis not present

## 2017-11-17 DIAGNOSIS — E662 Morbid (severe) obesity with alveolar hypoventilation: Secondary | ICD-10-CM | POA: Diagnosis not present

## 2017-11-17 DIAGNOSIS — I10 Essential (primary) hypertension: Secondary | ICD-10-CM | POA: Diagnosis not present

## 2017-11-18 DIAGNOSIS — E662 Morbid (severe) obesity with alveolar hypoventilation: Secondary | ICD-10-CM | POA: Diagnosis not present

## 2017-11-18 DIAGNOSIS — J45909 Unspecified asthma, uncomplicated: Secondary | ICD-10-CM | POA: Diagnosis not present

## 2017-11-18 DIAGNOSIS — I10 Essential (primary) hypertension: Secondary | ICD-10-CM | POA: Diagnosis not present

## 2017-11-19 DIAGNOSIS — J45909 Unspecified asthma, uncomplicated: Secondary | ICD-10-CM | POA: Diagnosis not present

## 2017-11-19 DIAGNOSIS — I10 Essential (primary) hypertension: Secondary | ICD-10-CM | POA: Diagnosis not present

## 2017-11-19 DIAGNOSIS — E662 Morbid (severe) obesity with alveolar hypoventilation: Secondary | ICD-10-CM | POA: Diagnosis not present

## 2017-11-20 DIAGNOSIS — E662 Morbid (severe) obesity with alveolar hypoventilation: Secondary | ICD-10-CM | POA: Diagnosis not present

## 2017-11-20 DIAGNOSIS — I10 Essential (primary) hypertension: Secondary | ICD-10-CM | POA: Diagnosis not present

## 2017-11-20 DIAGNOSIS — J45909 Unspecified asthma, uncomplicated: Secondary | ICD-10-CM | POA: Diagnosis not present

## 2017-11-21 DIAGNOSIS — J45909 Unspecified asthma, uncomplicated: Secondary | ICD-10-CM | POA: Diagnosis not present

## 2017-11-21 DIAGNOSIS — I10 Essential (primary) hypertension: Secondary | ICD-10-CM | POA: Diagnosis not present

## 2017-11-21 DIAGNOSIS — E662 Morbid (severe) obesity with alveolar hypoventilation: Secondary | ICD-10-CM | POA: Diagnosis not present

## 2017-11-22 DIAGNOSIS — J45909 Unspecified asthma, uncomplicated: Secondary | ICD-10-CM | POA: Diagnosis not present

## 2017-11-22 DIAGNOSIS — I10 Essential (primary) hypertension: Secondary | ICD-10-CM | POA: Diagnosis not present

## 2017-11-22 DIAGNOSIS — E662 Morbid (severe) obesity with alveolar hypoventilation: Secondary | ICD-10-CM | POA: Diagnosis not present

## 2017-11-23 DIAGNOSIS — I10 Essential (primary) hypertension: Secondary | ICD-10-CM | POA: Diagnosis not present

## 2017-11-23 DIAGNOSIS — E662 Morbid (severe) obesity with alveolar hypoventilation: Secondary | ICD-10-CM | POA: Diagnosis not present

## 2017-11-23 DIAGNOSIS — J45909 Unspecified asthma, uncomplicated: Secondary | ICD-10-CM | POA: Diagnosis not present

## 2017-11-24 DIAGNOSIS — I10 Essential (primary) hypertension: Secondary | ICD-10-CM | POA: Diagnosis not present

## 2017-11-24 DIAGNOSIS — E662 Morbid (severe) obesity with alveolar hypoventilation: Secondary | ICD-10-CM | POA: Diagnosis not present

## 2017-11-24 DIAGNOSIS — J45909 Unspecified asthma, uncomplicated: Secondary | ICD-10-CM | POA: Diagnosis not present

## 2017-11-25 DIAGNOSIS — J45909 Unspecified asthma, uncomplicated: Secondary | ICD-10-CM | POA: Diagnosis not present

## 2017-11-25 DIAGNOSIS — E662 Morbid (severe) obesity with alveolar hypoventilation: Secondary | ICD-10-CM | POA: Diagnosis not present

## 2017-11-25 DIAGNOSIS — I10 Essential (primary) hypertension: Secondary | ICD-10-CM | POA: Diagnosis not present

## 2017-11-26 DIAGNOSIS — E662 Morbid (severe) obesity with alveolar hypoventilation: Secondary | ICD-10-CM | POA: Diagnosis not present

## 2017-11-26 DIAGNOSIS — J45909 Unspecified asthma, uncomplicated: Secondary | ICD-10-CM | POA: Diagnosis not present

## 2017-11-26 DIAGNOSIS — I10 Essential (primary) hypertension: Secondary | ICD-10-CM | POA: Diagnosis not present

## 2017-11-27 DIAGNOSIS — J45909 Unspecified asthma, uncomplicated: Secondary | ICD-10-CM | POA: Diagnosis not present

## 2017-11-27 DIAGNOSIS — I10 Essential (primary) hypertension: Secondary | ICD-10-CM | POA: Diagnosis not present

## 2017-11-27 DIAGNOSIS — E662 Morbid (severe) obesity with alveolar hypoventilation: Secondary | ICD-10-CM | POA: Diagnosis not present

## 2017-11-28 DIAGNOSIS — J45909 Unspecified asthma, uncomplicated: Secondary | ICD-10-CM | POA: Diagnosis not present

## 2017-11-28 DIAGNOSIS — I10 Essential (primary) hypertension: Secondary | ICD-10-CM | POA: Diagnosis not present

## 2017-11-28 DIAGNOSIS — E662 Morbid (severe) obesity with alveolar hypoventilation: Secondary | ICD-10-CM | POA: Diagnosis not present

## 2017-11-29 DIAGNOSIS — E662 Morbid (severe) obesity with alveolar hypoventilation: Secondary | ICD-10-CM | POA: Diagnosis not present

## 2017-11-29 DIAGNOSIS — J45909 Unspecified asthma, uncomplicated: Secondary | ICD-10-CM | POA: Diagnosis not present

## 2017-11-29 DIAGNOSIS — I10 Essential (primary) hypertension: Secondary | ICD-10-CM | POA: Diagnosis not present

## 2017-11-30 DIAGNOSIS — E662 Morbid (severe) obesity with alveolar hypoventilation: Secondary | ICD-10-CM | POA: Diagnosis not present

## 2017-11-30 DIAGNOSIS — I10 Essential (primary) hypertension: Secondary | ICD-10-CM | POA: Diagnosis not present

## 2017-11-30 DIAGNOSIS — J45909 Unspecified asthma, uncomplicated: Secondary | ICD-10-CM | POA: Diagnosis not present

## 2017-12-01 DIAGNOSIS — J45909 Unspecified asthma, uncomplicated: Secondary | ICD-10-CM | POA: Diagnosis not present

## 2017-12-01 DIAGNOSIS — I10 Essential (primary) hypertension: Secondary | ICD-10-CM | POA: Diagnosis not present

## 2017-12-01 DIAGNOSIS — E662 Morbid (severe) obesity with alveolar hypoventilation: Secondary | ICD-10-CM | POA: Diagnosis not present

## 2017-12-01 IMAGING — DX DG CHEST 2V
2 series · 2 of 2 positions shown · non-contrast
Comparison: October 23, 2016

CLINICAL DATA: Shortness of Breath

EXAM:
CHEST  2 VIEW

[chest pa]
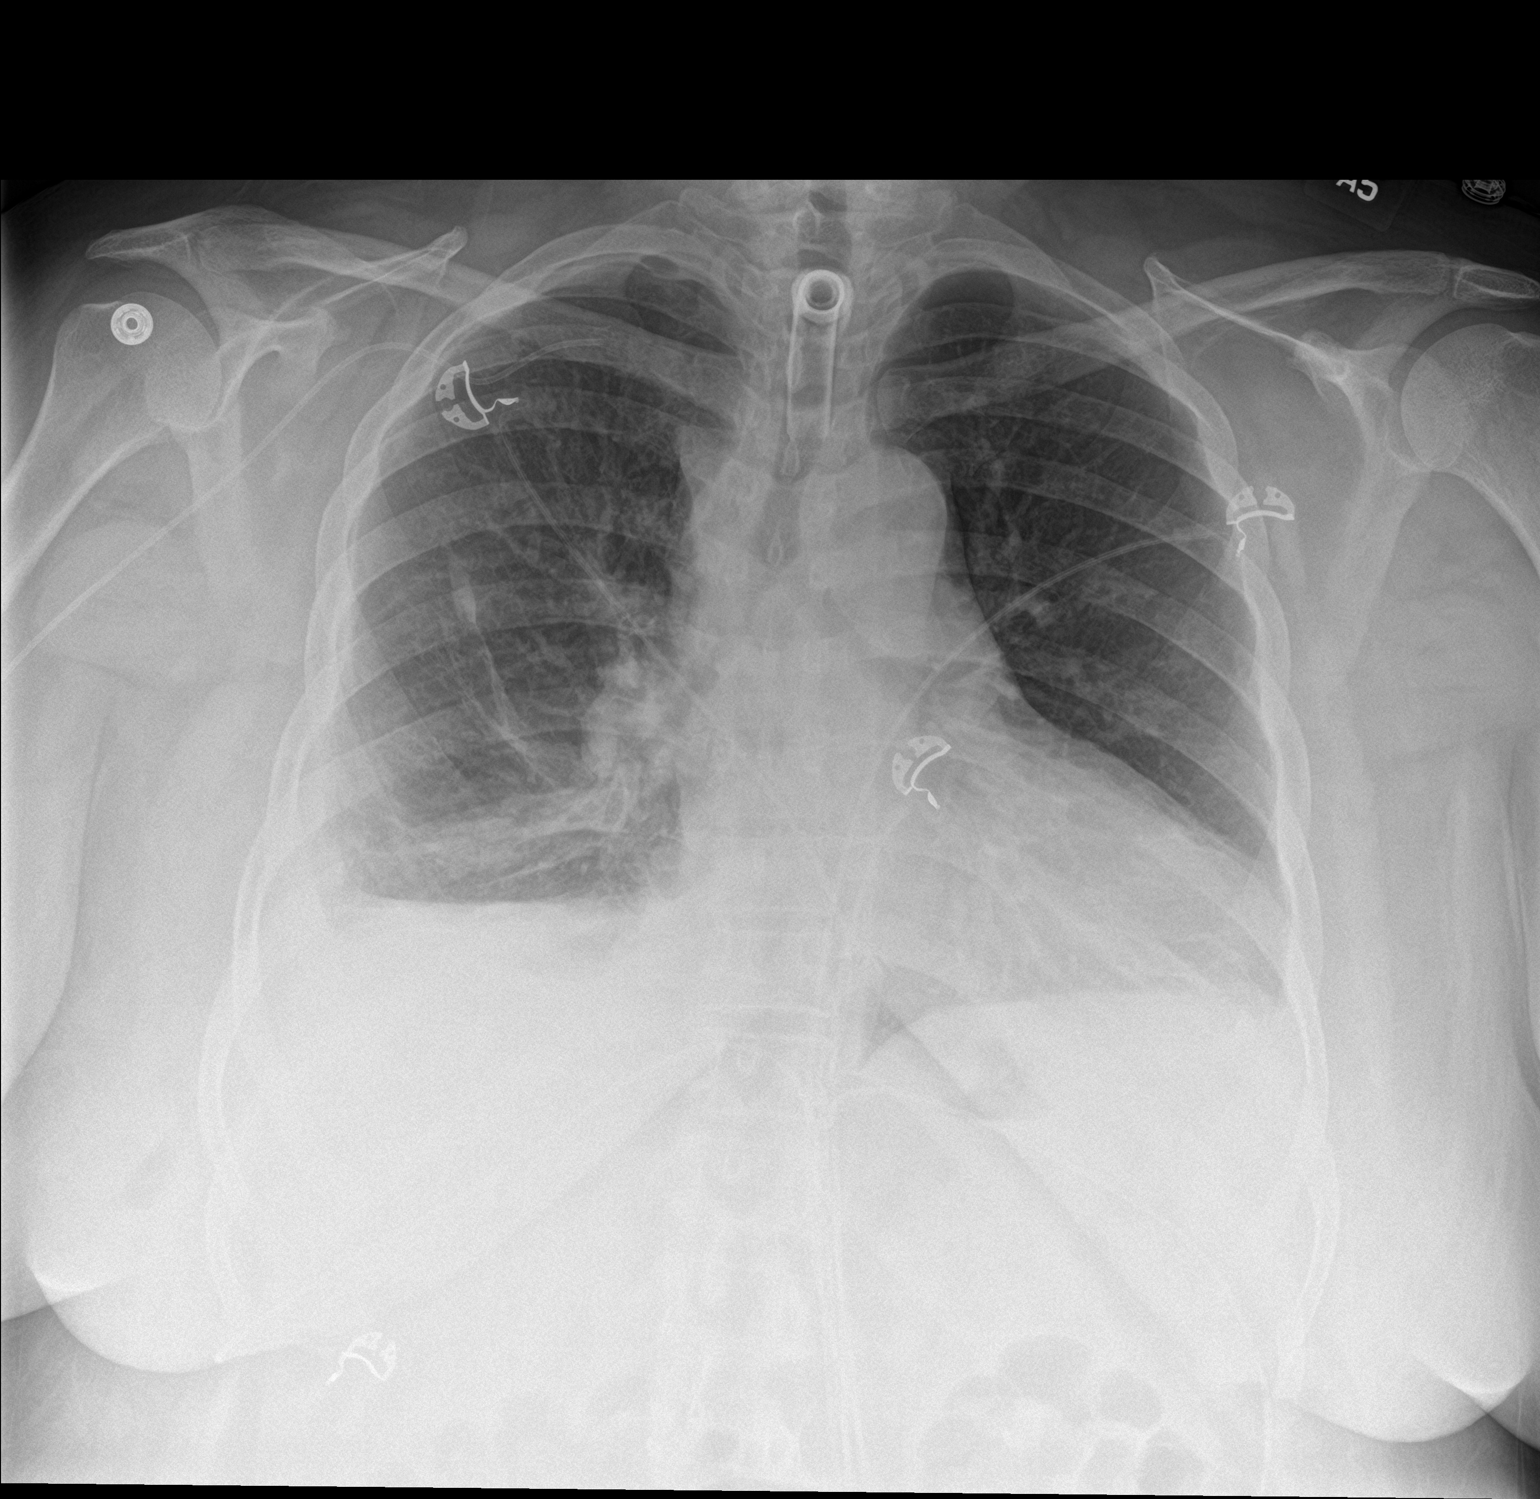

[chest lat]
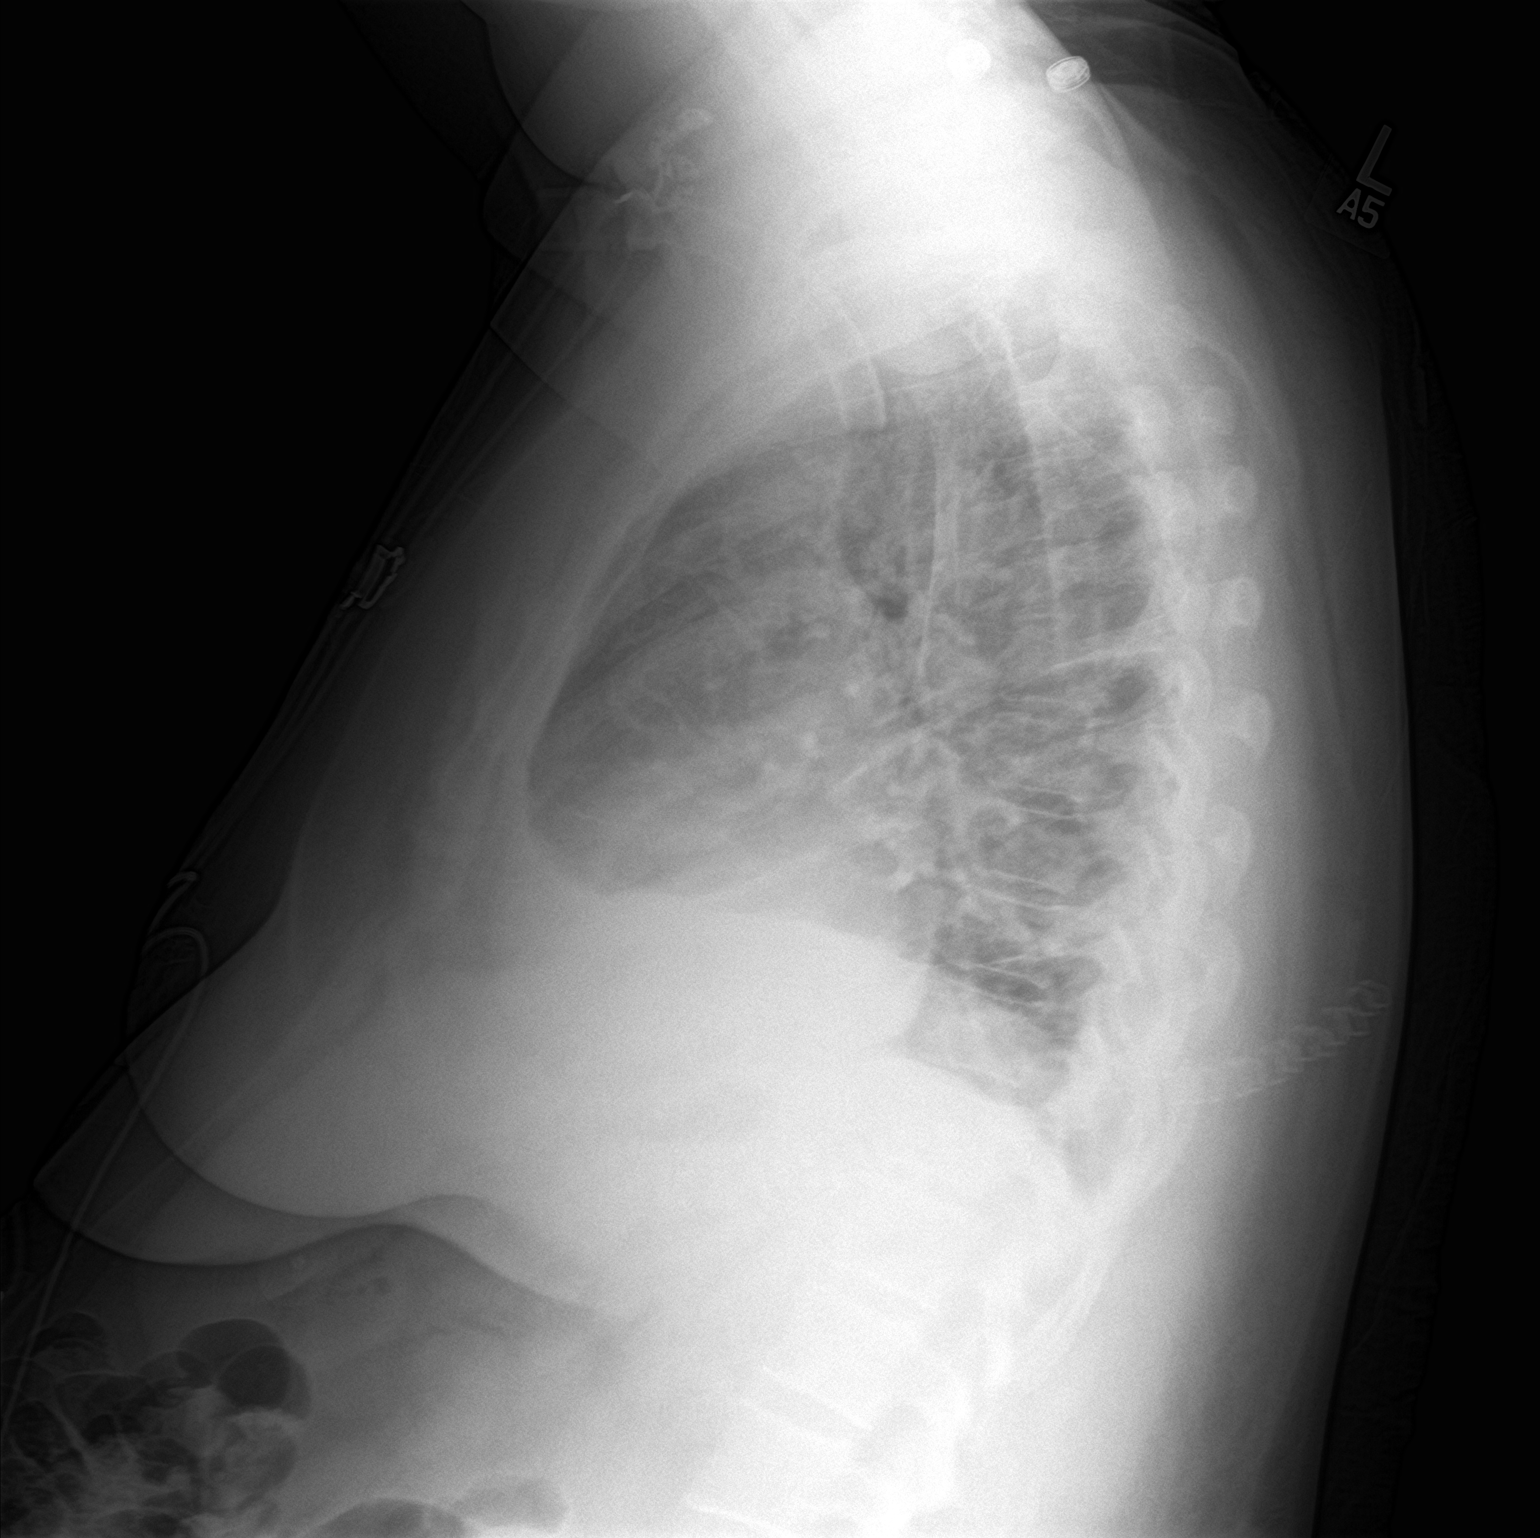

[2 of 2 positions shown; findings below may reference images not displayed]

FINDINGS: Right subclavian catheter tip is in the right subclavian vein
region, unchanged. Tracheostomy catheter tip is 3.6 cm above the
carina. No pneumothorax. There is a small pleural effusion on the
right with rather minimal pleural effusion on the left. There is
patchy bibasilar atelectasis with atelectasis in the right mid lung
as well. Lungs elsewhere are clear. Heart is upper normal in size
with pulmonary vascularity within normal limits. No adenopathy. No
evident bone lesions.
IMPRESSION: Tube and catheter positions as described without pneumothorax. Note
that the subclavian catheter tip in the right base in the right
subclavian vein. There is atelectatic change in both lung bases and
right mid lung, stable. There are small pleural effusions
bilaterally, slightly larger on the right than on the left. No
airspace consolidation appreciable. Stable cardiac silhouette.

## 2017-12-02 DIAGNOSIS — J45909 Unspecified asthma, uncomplicated: Secondary | ICD-10-CM | POA: Diagnosis not present

## 2017-12-02 DIAGNOSIS — I10 Essential (primary) hypertension: Secondary | ICD-10-CM | POA: Diagnosis not present

## 2017-12-02 DIAGNOSIS — E662 Morbid (severe) obesity with alveolar hypoventilation: Secondary | ICD-10-CM | POA: Diagnosis not present

## 2017-12-03 DIAGNOSIS — I10 Essential (primary) hypertension: Secondary | ICD-10-CM | POA: Diagnosis not present

## 2017-12-03 DIAGNOSIS — E662 Morbid (severe) obesity with alveolar hypoventilation: Secondary | ICD-10-CM | POA: Diagnosis not present

## 2017-12-03 DIAGNOSIS — J45909 Unspecified asthma, uncomplicated: Secondary | ICD-10-CM | POA: Diagnosis not present

## 2017-12-04 DIAGNOSIS — E662 Morbid (severe) obesity with alveolar hypoventilation: Secondary | ICD-10-CM | POA: Diagnosis not present

## 2017-12-04 DIAGNOSIS — J45909 Unspecified asthma, uncomplicated: Secondary | ICD-10-CM | POA: Diagnosis not present

## 2017-12-04 DIAGNOSIS — I10 Essential (primary) hypertension: Secondary | ICD-10-CM | POA: Diagnosis not present

## 2017-12-05 DIAGNOSIS — E662 Morbid (severe) obesity with alveolar hypoventilation: Secondary | ICD-10-CM | POA: Diagnosis not present

## 2017-12-05 DIAGNOSIS — I10 Essential (primary) hypertension: Secondary | ICD-10-CM | POA: Diagnosis not present

## 2017-12-05 DIAGNOSIS — J45909 Unspecified asthma, uncomplicated: Secondary | ICD-10-CM | POA: Diagnosis not present

## 2017-12-06 DIAGNOSIS — J45909 Unspecified asthma, uncomplicated: Secondary | ICD-10-CM | POA: Diagnosis not present

## 2017-12-06 DIAGNOSIS — I10 Essential (primary) hypertension: Secondary | ICD-10-CM | POA: Diagnosis not present

## 2017-12-06 DIAGNOSIS — E662 Morbid (severe) obesity with alveolar hypoventilation: Secondary | ICD-10-CM | POA: Diagnosis not present

## 2017-12-07 DIAGNOSIS — J45909 Unspecified asthma, uncomplicated: Secondary | ICD-10-CM | POA: Diagnosis not present

## 2017-12-07 DIAGNOSIS — E662 Morbid (severe) obesity with alveolar hypoventilation: Secondary | ICD-10-CM | POA: Diagnosis not present

## 2017-12-07 DIAGNOSIS — I10 Essential (primary) hypertension: Secondary | ICD-10-CM | POA: Diagnosis not present

## 2017-12-08 DIAGNOSIS — I10 Essential (primary) hypertension: Secondary | ICD-10-CM | POA: Diagnosis not present

## 2017-12-08 DIAGNOSIS — J45909 Unspecified asthma, uncomplicated: Secondary | ICD-10-CM | POA: Diagnosis not present

## 2017-12-08 DIAGNOSIS — E662 Morbid (severe) obesity with alveolar hypoventilation: Secondary | ICD-10-CM | POA: Diagnosis not present

## 2017-12-09 DIAGNOSIS — I10 Essential (primary) hypertension: Secondary | ICD-10-CM | POA: Diagnosis not present

## 2017-12-09 DIAGNOSIS — E662 Morbid (severe) obesity with alveolar hypoventilation: Secondary | ICD-10-CM | POA: Diagnosis not present

## 2017-12-09 DIAGNOSIS — J45909 Unspecified asthma, uncomplicated: Secondary | ICD-10-CM | POA: Diagnosis not present

## 2017-12-10 DIAGNOSIS — E662 Morbid (severe) obesity with alveolar hypoventilation: Secondary | ICD-10-CM | POA: Diagnosis not present

## 2017-12-10 DIAGNOSIS — J45909 Unspecified asthma, uncomplicated: Secondary | ICD-10-CM | POA: Diagnosis not present

## 2017-12-10 DIAGNOSIS — I10 Essential (primary) hypertension: Secondary | ICD-10-CM | POA: Diagnosis not present

## 2017-12-11 DIAGNOSIS — J45909 Unspecified asthma, uncomplicated: Secondary | ICD-10-CM | POA: Diagnosis not present

## 2017-12-11 DIAGNOSIS — I10 Essential (primary) hypertension: Secondary | ICD-10-CM | POA: Diagnosis not present

## 2017-12-11 DIAGNOSIS — E662 Morbid (severe) obesity with alveolar hypoventilation: Secondary | ICD-10-CM | POA: Diagnosis not present

## 2017-12-12 DIAGNOSIS — J45909 Unspecified asthma, uncomplicated: Secondary | ICD-10-CM | POA: Diagnosis not present

## 2017-12-12 DIAGNOSIS — E662 Morbid (severe) obesity with alveolar hypoventilation: Secondary | ICD-10-CM | POA: Diagnosis not present

## 2017-12-12 DIAGNOSIS — I10 Essential (primary) hypertension: Secondary | ICD-10-CM | POA: Diagnosis not present

## 2017-12-13 DIAGNOSIS — I10 Essential (primary) hypertension: Secondary | ICD-10-CM | POA: Diagnosis not present

## 2017-12-13 DIAGNOSIS — E662 Morbid (severe) obesity with alveolar hypoventilation: Secondary | ICD-10-CM | POA: Diagnosis not present

## 2017-12-13 DIAGNOSIS — J45909 Unspecified asthma, uncomplicated: Secondary | ICD-10-CM | POA: Diagnosis not present

## 2017-12-14 DIAGNOSIS — J45909 Unspecified asthma, uncomplicated: Secondary | ICD-10-CM | POA: Diagnosis not present

## 2017-12-14 DIAGNOSIS — E662 Morbid (severe) obesity with alveolar hypoventilation: Secondary | ICD-10-CM | POA: Diagnosis not present

## 2017-12-14 DIAGNOSIS — I10 Essential (primary) hypertension: Secondary | ICD-10-CM | POA: Diagnosis not present

## 2017-12-15 DIAGNOSIS — I10 Essential (primary) hypertension: Secondary | ICD-10-CM | POA: Diagnosis not present

## 2017-12-15 DIAGNOSIS — J45909 Unspecified asthma, uncomplicated: Secondary | ICD-10-CM | POA: Diagnosis not present

## 2017-12-15 DIAGNOSIS — E662 Morbid (severe) obesity with alveolar hypoventilation: Secondary | ICD-10-CM | POA: Diagnosis not present

## 2017-12-16 DIAGNOSIS — I10 Essential (primary) hypertension: Secondary | ICD-10-CM | POA: Diagnosis not present

## 2017-12-16 DIAGNOSIS — E662 Morbid (severe) obesity with alveolar hypoventilation: Secondary | ICD-10-CM | POA: Diagnosis not present

## 2017-12-16 DIAGNOSIS — J45909 Unspecified asthma, uncomplicated: Secondary | ICD-10-CM | POA: Diagnosis not present

## 2017-12-16 IMAGING — CR DG CHEST 2V
2 series · 2 of 2 positions shown · non-contrast
Comparison: 10/26/2016

CLINICAL DATA: Post thoracotomy, right VATS

EXAM:
CHEST  2 VIEW

[w chest pa]
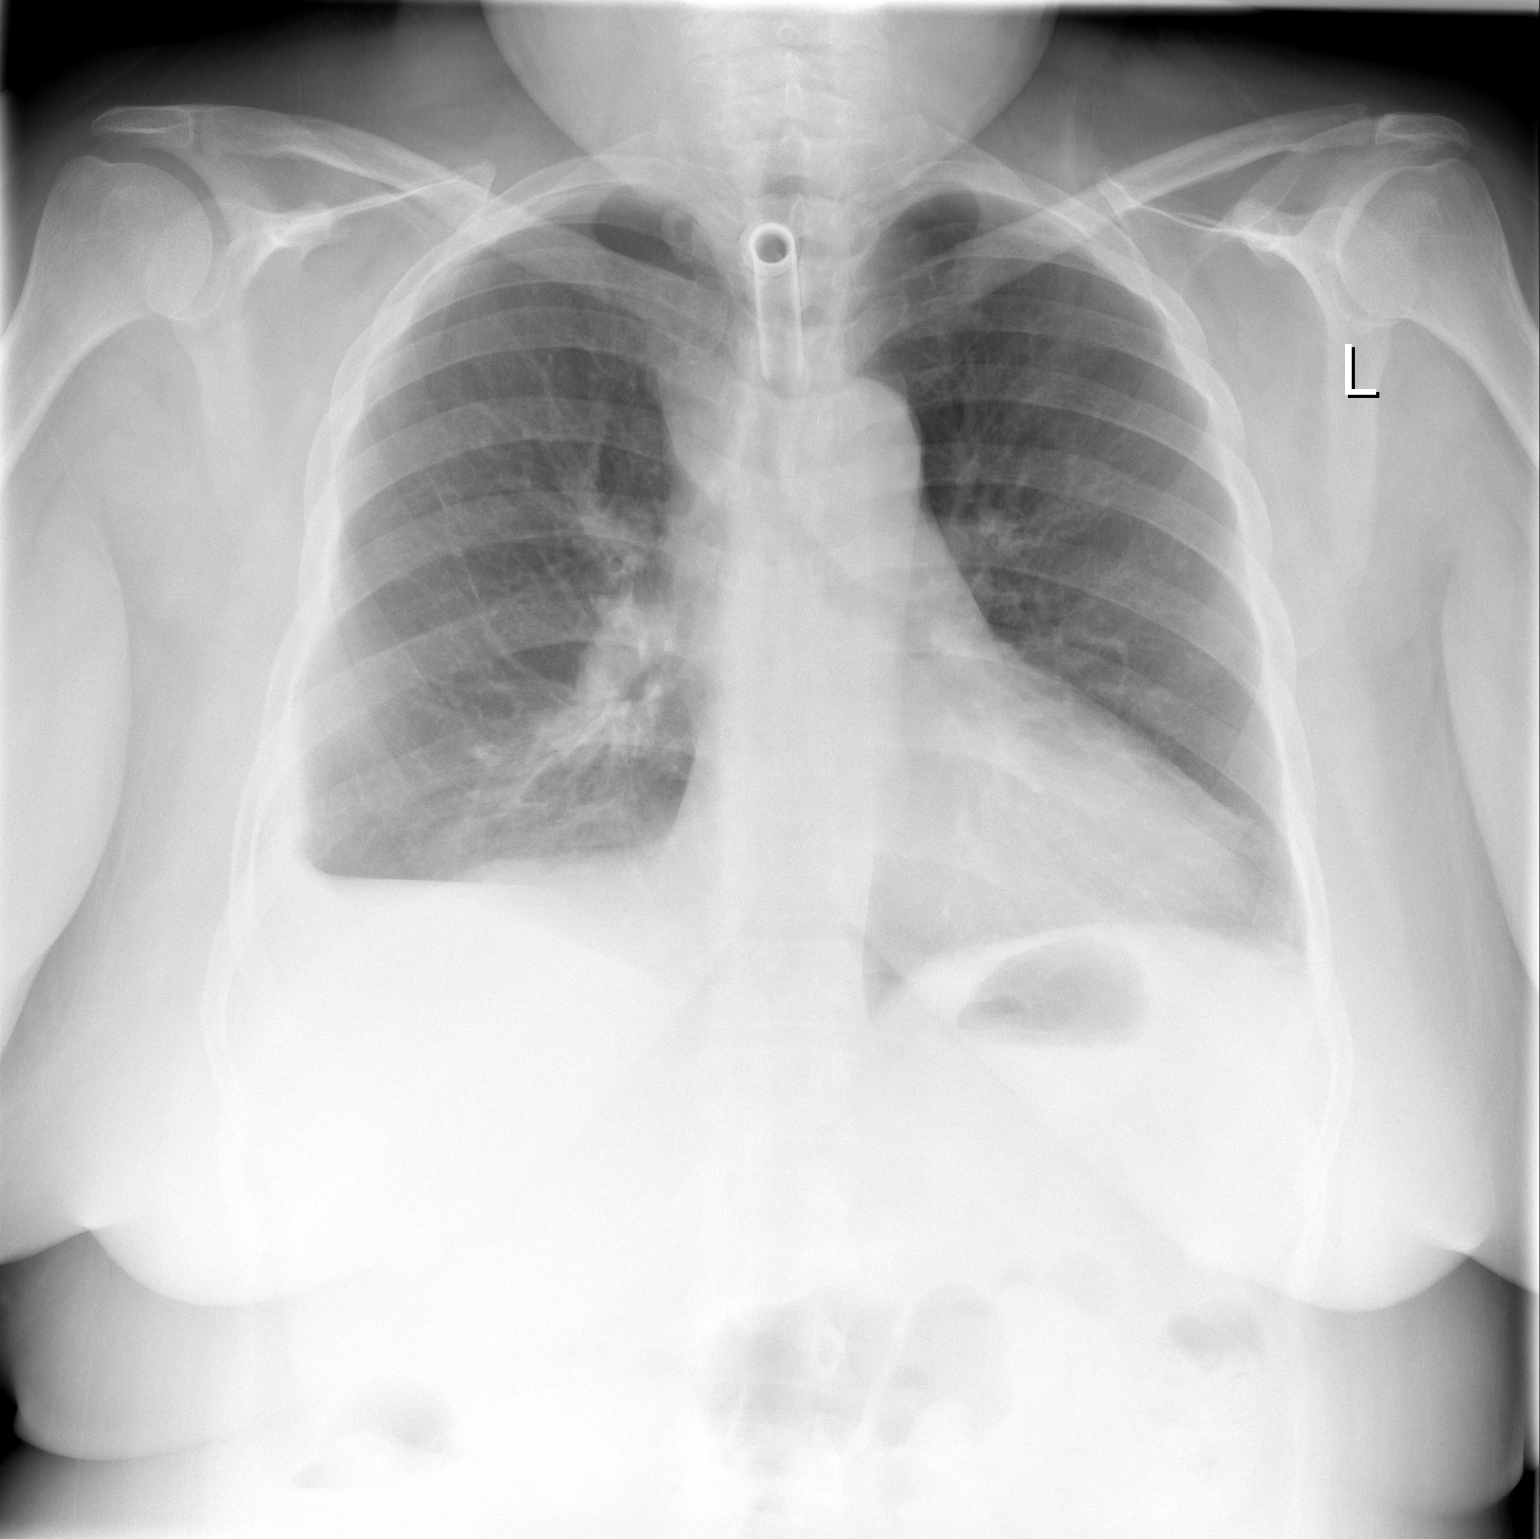

[w chest lat]
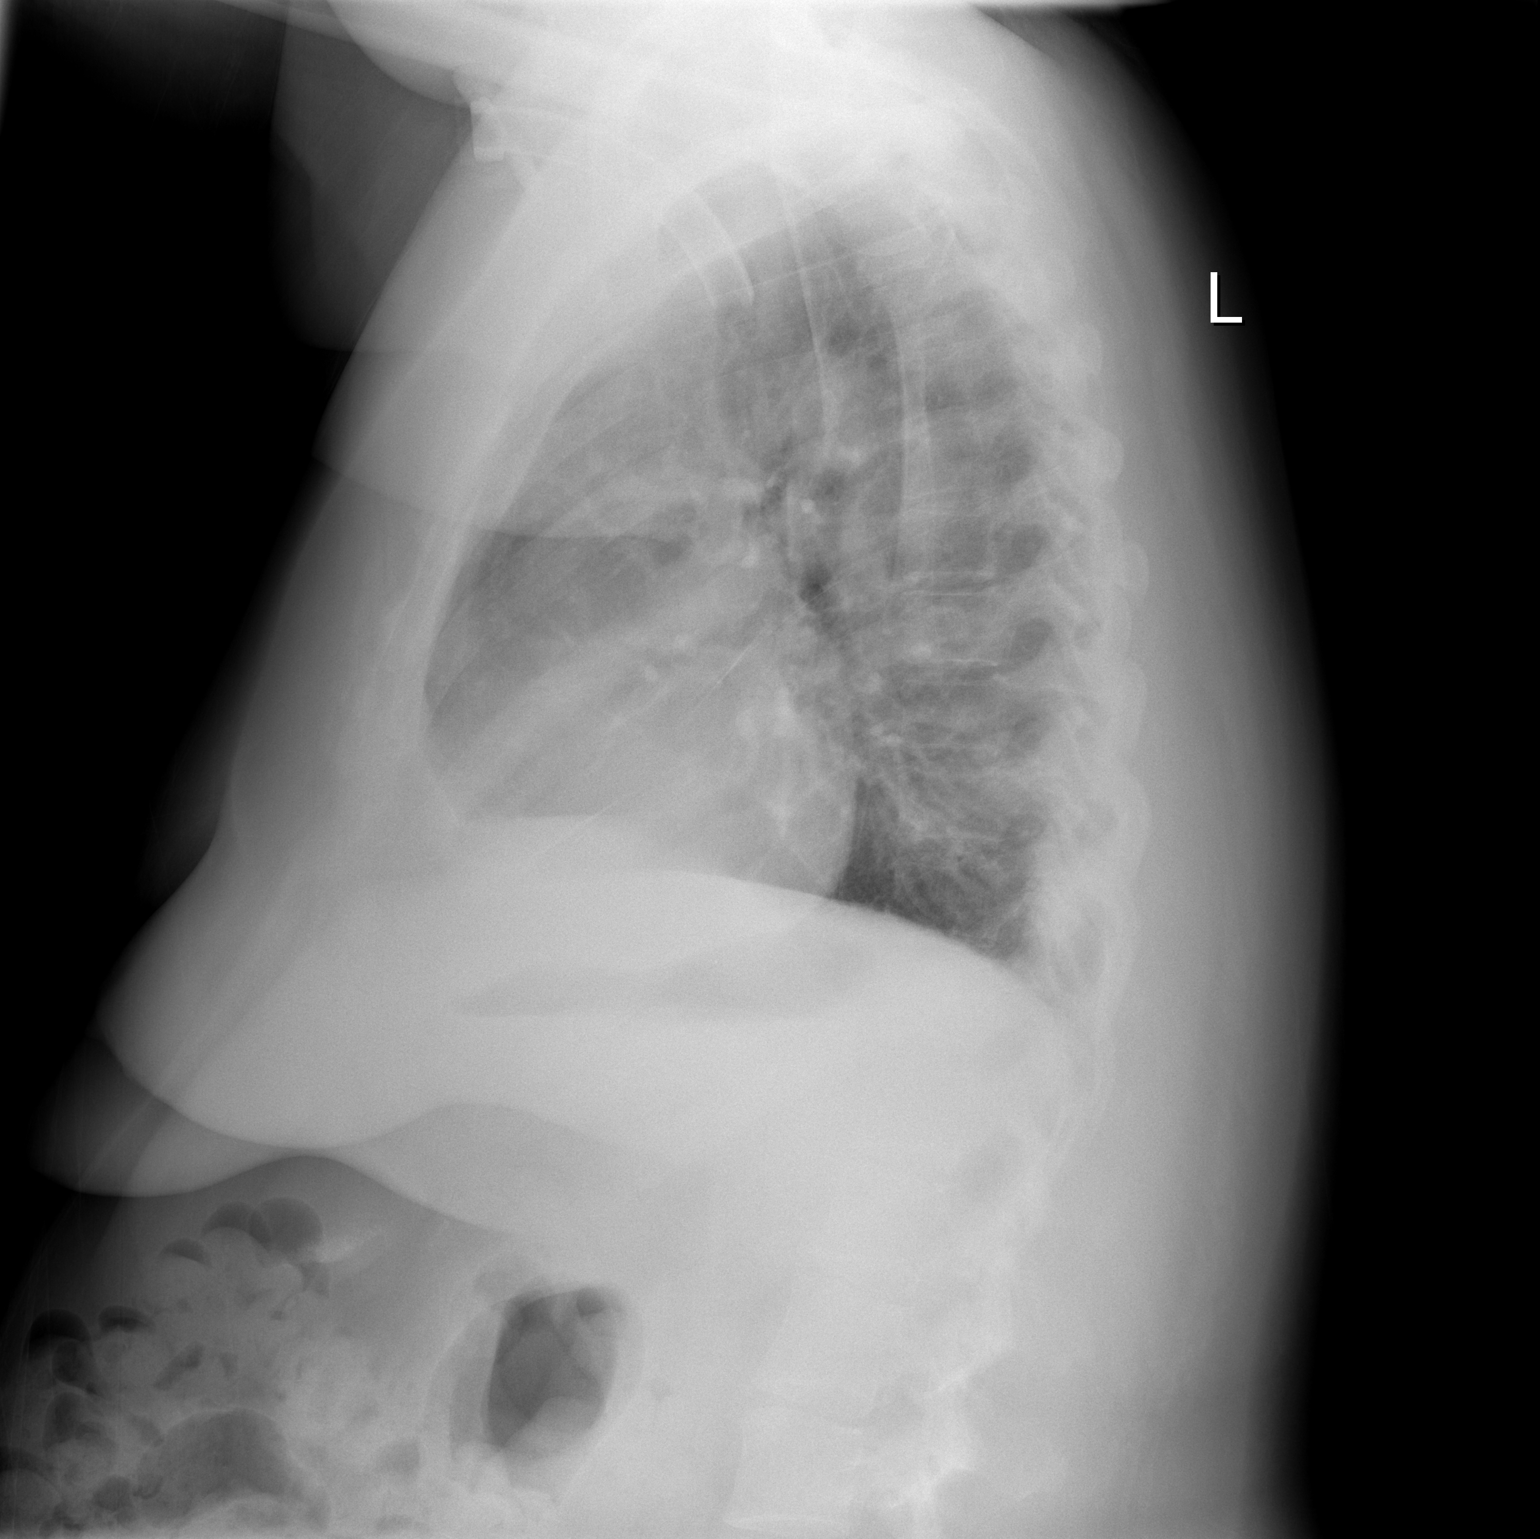

[2 of 2 positions shown; findings below may reference images not displayed]

FINDINGS: Small right pleural effusion. No pneumothorax. Right base
atelectasis. Tracheostomy is unchanged. Right PICC line has been
removed. Minimal left base atelectasis. Heart is normal size.
IMPRESSION: Blunting of the right costophrenic angle, stable, compatible with
small right effusion. Bibasilar atelectasis. No pneumothorax.

## 2017-12-17 DIAGNOSIS — J45909 Unspecified asthma, uncomplicated: Secondary | ICD-10-CM | POA: Diagnosis not present

## 2017-12-17 DIAGNOSIS — E662 Morbid (severe) obesity with alveolar hypoventilation: Secondary | ICD-10-CM | POA: Diagnosis not present

## 2017-12-17 DIAGNOSIS — I10 Essential (primary) hypertension: Secondary | ICD-10-CM | POA: Diagnosis not present

## 2017-12-18 DIAGNOSIS — E662 Morbid (severe) obesity with alveolar hypoventilation: Secondary | ICD-10-CM | POA: Diagnosis not present

## 2017-12-18 DIAGNOSIS — J45909 Unspecified asthma, uncomplicated: Secondary | ICD-10-CM | POA: Diagnosis not present

## 2017-12-18 DIAGNOSIS — I10 Essential (primary) hypertension: Secondary | ICD-10-CM | POA: Diagnosis not present

## 2017-12-19 DIAGNOSIS — I10 Essential (primary) hypertension: Secondary | ICD-10-CM | POA: Diagnosis not present

## 2017-12-19 DIAGNOSIS — E662 Morbid (severe) obesity with alveolar hypoventilation: Secondary | ICD-10-CM | POA: Diagnosis not present

## 2017-12-19 DIAGNOSIS — J45909 Unspecified asthma, uncomplicated: Secondary | ICD-10-CM | POA: Diagnosis not present

## 2017-12-20 DIAGNOSIS — E662 Morbid (severe) obesity with alveolar hypoventilation: Secondary | ICD-10-CM | POA: Diagnosis not present

## 2017-12-20 DIAGNOSIS — J45909 Unspecified asthma, uncomplicated: Secondary | ICD-10-CM | POA: Diagnosis not present

## 2017-12-20 DIAGNOSIS — I10 Essential (primary) hypertension: Secondary | ICD-10-CM | POA: Diagnosis not present

## 2017-12-21 DIAGNOSIS — J45909 Unspecified asthma, uncomplicated: Secondary | ICD-10-CM | POA: Diagnosis not present

## 2017-12-21 DIAGNOSIS — E662 Morbid (severe) obesity with alveolar hypoventilation: Secondary | ICD-10-CM | POA: Diagnosis not present

## 2017-12-21 DIAGNOSIS — I10 Essential (primary) hypertension: Secondary | ICD-10-CM | POA: Diagnosis not present

## 2017-12-22 DIAGNOSIS — I10 Essential (primary) hypertension: Secondary | ICD-10-CM | POA: Diagnosis not present

## 2017-12-22 DIAGNOSIS — E662 Morbid (severe) obesity with alveolar hypoventilation: Secondary | ICD-10-CM | POA: Diagnosis not present

## 2017-12-22 DIAGNOSIS — J45909 Unspecified asthma, uncomplicated: Secondary | ICD-10-CM | POA: Diagnosis not present

## 2017-12-23 DIAGNOSIS — E662 Morbid (severe) obesity with alveolar hypoventilation: Secondary | ICD-10-CM | POA: Diagnosis not present

## 2017-12-23 DIAGNOSIS — I10 Essential (primary) hypertension: Secondary | ICD-10-CM | POA: Diagnosis not present

## 2017-12-23 DIAGNOSIS — J45909 Unspecified asthma, uncomplicated: Secondary | ICD-10-CM | POA: Diagnosis not present

## 2017-12-24 DIAGNOSIS — J45909 Unspecified asthma, uncomplicated: Secondary | ICD-10-CM | POA: Diagnosis not present

## 2017-12-24 DIAGNOSIS — I10 Essential (primary) hypertension: Secondary | ICD-10-CM | POA: Diagnosis not present

## 2017-12-24 DIAGNOSIS — E662 Morbid (severe) obesity with alveolar hypoventilation: Secondary | ICD-10-CM | POA: Diagnosis not present

## 2017-12-25 DIAGNOSIS — I10 Essential (primary) hypertension: Secondary | ICD-10-CM | POA: Diagnosis not present

## 2017-12-25 DIAGNOSIS — E662 Morbid (severe) obesity with alveolar hypoventilation: Secondary | ICD-10-CM | POA: Diagnosis not present

## 2017-12-25 DIAGNOSIS — J45909 Unspecified asthma, uncomplicated: Secondary | ICD-10-CM | POA: Diagnosis not present

## 2017-12-26 DIAGNOSIS — E662 Morbid (severe) obesity with alveolar hypoventilation: Secondary | ICD-10-CM | POA: Diagnosis not present

## 2017-12-26 DIAGNOSIS — I10 Essential (primary) hypertension: Secondary | ICD-10-CM | POA: Diagnosis not present

## 2017-12-26 DIAGNOSIS — J45909 Unspecified asthma, uncomplicated: Secondary | ICD-10-CM | POA: Diagnosis not present

## 2017-12-27 DIAGNOSIS — J45909 Unspecified asthma, uncomplicated: Secondary | ICD-10-CM | POA: Diagnosis not present

## 2017-12-27 DIAGNOSIS — I10 Essential (primary) hypertension: Secondary | ICD-10-CM | POA: Diagnosis not present

## 2017-12-27 DIAGNOSIS — E662 Morbid (severe) obesity with alveolar hypoventilation: Secondary | ICD-10-CM | POA: Diagnosis not present

## 2017-12-28 DIAGNOSIS — J45909 Unspecified asthma, uncomplicated: Secondary | ICD-10-CM | POA: Diagnosis not present

## 2017-12-28 DIAGNOSIS — E662 Morbid (severe) obesity with alveolar hypoventilation: Secondary | ICD-10-CM | POA: Diagnosis not present

## 2017-12-28 DIAGNOSIS — I10 Essential (primary) hypertension: Secondary | ICD-10-CM | POA: Diagnosis not present

## 2017-12-29 DIAGNOSIS — E559 Vitamin D deficiency, unspecified: Secondary | ICD-10-CM | POA: Diagnosis not present

## 2017-12-29 DIAGNOSIS — N183 Chronic kidney disease, stage 3 (moderate): Secondary | ICD-10-CM | POA: Diagnosis not present

## 2017-12-29 DIAGNOSIS — E785 Hyperlipidemia, unspecified: Secondary | ICD-10-CM | POA: Diagnosis not present

## 2017-12-29 DIAGNOSIS — I10 Essential (primary) hypertension: Secondary | ICD-10-CM | POA: Diagnosis not present

## 2017-12-29 DIAGNOSIS — E662 Morbid (severe) obesity with alveolar hypoventilation: Secondary | ICD-10-CM | POA: Diagnosis not present

## 2017-12-29 DIAGNOSIS — J45909 Unspecified asthma, uncomplicated: Secondary | ICD-10-CM | POA: Diagnosis not present

## 2017-12-29 DIAGNOSIS — E669 Obesity, unspecified: Secondary | ICD-10-CM | POA: Diagnosis not present

## 2017-12-30 DIAGNOSIS — J45909 Unspecified asthma, uncomplicated: Secondary | ICD-10-CM | POA: Diagnosis not present

## 2017-12-30 DIAGNOSIS — E662 Morbid (severe) obesity with alveolar hypoventilation: Secondary | ICD-10-CM | POA: Diagnosis not present

## 2017-12-30 DIAGNOSIS — I10 Essential (primary) hypertension: Secondary | ICD-10-CM | POA: Diagnosis not present

## 2017-12-31 DIAGNOSIS — J45909 Unspecified asthma, uncomplicated: Secondary | ICD-10-CM | POA: Diagnosis not present

## 2017-12-31 DIAGNOSIS — I10 Essential (primary) hypertension: Secondary | ICD-10-CM | POA: Diagnosis not present

## 2017-12-31 DIAGNOSIS — E662 Morbid (severe) obesity with alveolar hypoventilation: Secondary | ICD-10-CM | POA: Diagnosis not present

## 2018-01-01 DIAGNOSIS — E662 Morbid (severe) obesity with alveolar hypoventilation: Secondary | ICD-10-CM | POA: Diagnosis not present

## 2018-01-01 DIAGNOSIS — J45909 Unspecified asthma, uncomplicated: Secondary | ICD-10-CM | POA: Diagnosis not present

## 2018-01-01 DIAGNOSIS — I10 Essential (primary) hypertension: Secondary | ICD-10-CM | POA: Diagnosis not present

## 2018-01-02 DIAGNOSIS — I10 Essential (primary) hypertension: Secondary | ICD-10-CM | POA: Diagnosis not present

## 2018-01-02 DIAGNOSIS — E662 Morbid (severe) obesity with alveolar hypoventilation: Secondary | ICD-10-CM | POA: Diagnosis not present

## 2018-01-02 DIAGNOSIS — J45909 Unspecified asthma, uncomplicated: Secondary | ICD-10-CM | POA: Diagnosis not present

## 2018-01-03 DIAGNOSIS — J45909 Unspecified asthma, uncomplicated: Secondary | ICD-10-CM | POA: Diagnosis not present

## 2018-01-03 DIAGNOSIS — E662 Morbid (severe) obesity with alveolar hypoventilation: Secondary | ICD-10-CM | POA: Diagnosis not present

## 2018-01-03 DIAGNOSIS — I10 Essential (primary) hypertension: Secondary | ICD-10-CM | POA: Diagnosis not present

## 2018-01-04 DIAGNOSIS — J45909 Unspecified asthma, uncomplicated: Secondary | ICD-10-CM | POA: Diagnosis not present

## 2018-01-04 DIAGNOSIS — I10 Essential (primary) hypertension: Secondary | ICD-10-CM | POA: Diagnosis not present

## 2018-01-04 DIAGNOSIS — E662 Morbid (severe) obesity with alveolar hypoventilation: Secondary | ICD-10-CM | POA: Diagnosis not present

## 2018-01-05 DIAGNOSIS — E662 Morbid (severe) obesity with alveolar hypoventilation: Secondary | ICD-10-CM | POA: Diagnosis not present

## 2018-01-05 DIAGNOSIS — I10 Essential (primary) hypertension: Secondary | ICD-10-CM | POA: Diagnosis not present

## 2018-01-05 DIAGNOSIS — J45909 Unspecified asthma, uncomplicated: Secondary | ICD-10-CM | POA: Diagnosis not present

## 2018-01-06 DIAGNOSIS — J45909 Unspecified asthma, uncomplicated: Secondary | ICD-10-CM | POA: Diagnosis not present

## 2018-01-06 DIAGNOSIS — E662 Morbid (severe) obesity with alveolar hypoventilation: Secondary | ICD-10-CM | POA: Diagnosis not present

## 2018-01-06 DIAGNOSIS — I10 Essential (primary) hypertension: Secondary | ICD-10-CM | POA: Diagnosis not present

## 2018-01-07 DIAGNOSIS — E662 Morbid (severe) obesity with alveolar hypoventilation: Secondary | ICD-10-CM | POA: Diagnosis not present

## 2018-01-07 DIAGNOSIS — I10 Essential (primary) hypertension: Secondary | ICD-10-CM | POA: Diagnosis not present

## 2018-01-07 DIAGNOSIS — J45909 Unspecified asthma, uncomplicated: Secondary | ICD-10-CM | POA: Diagnosis not present

## 2018-01-08 DIAGNOSIS — J45909 Unspecified asthma, uncomplicated: Secondary | ICD-10-CM | POA: Diagnosis not present

## 2018-01-08 DIAGNOSIS — E662 Morbid (severe) obesity with alveolar hypoventilation: Secondary | ICD-10-CM | POA: Diagnosis not present

## 2018-01-08 DIAGNOSIS — I10 Essential (primary) hypertension: Secondary | ICD-10-CM | POA: Diagnosis not present

## 2018-01-09 DIAGNOSIS — J45909 Unspecified asthma, uncomplicated: Secondary | ICD-10-CM | POA: Diagnosis not present

## 2018-01-09 DIAGNOSIS — E662 Morbid (severe) obesity with alveolar hypoventilation: Secondary | ICD-10-CM | POA: Diagnosis not present

## 2018-01-09 DIAGNOSIS — I10 Essential (primary) hypertension: Secondary | ICD-10-CM | POA: Diagnosis not present

## 2018-01-10 DIAGNOSIS — I10 Essential (primary) hypertension: Secondary | ICD-10-CM | POA: Diagnosis not present

## 2018-01-10 DIAGNOSIS — E662 Morbid (severe) obesity with alveolar hypoventilation: Secondary | ICD-10-CM | POA: Diagnosis not present

## 2018-01-10 DIAGNOSIS — J45909 Unspecified asthma, uncomplicated: Secondary | ICD-10-CM | POA: Diagnosis not present

## 2018-01-11 DIAGNOSIS — J45909 Unspecified asthma, uncomplicated: Secondary | ICD-10-CM | POA: Diagnosis not present

## 2018-01-11 DIAGNOSIS — E662 Morbid (severe) obesity with alveolar hypoventilation: Secondary | ICD-10-CM | POA: Diagnosis not present

## 2018-01-11 DIAGNOSIS — I10 Essential (primary) hypertension: Secondary | ICD-10-CM | POA: Diagnosis not present

## 2018-01-12 DIAGNOSIS — I10 Essential (primary) hypertension: Secondary | ICD-10-CM | POA: Diagnosis not present

## 2018-01-12 DIAGNOSIS — E662 Morbid (severe) obesity with alveolar hypoventilation: Secondary | ICD-10-CM | POA: Diagnosis not present

## 2018-01-12 DIAGNOSIS — J45909 Unspecified asthma, uncomplicated: Secondary | ICD-10-CM | POA: Diagnosis not present

## 2018-01-13 DIAGNOSIS — J45909 Unspecified asthma, uncomplicated: Secondary | ICD-10-CM | POA: Diagnosis not present

## 2018-01-13 DIAGNOSIS — E662 Morbid (severe) obesity with alveolar hypoventilation: Secondary | ICD-10-CM | POA: Diagnosis not present

## 2018-01-13 DIAGNOSIS — I10 Essential (primary) hypertension: Secondary | ICD-10-CM | POA: Diagnosis not present

## 2018-01-14 DIAGNOSIS — E662 Morbid (severe) obesity with alveolar hypoventilation: Secondary | ICD-10-CM | POA: Diagnosis not present

## 2018-01-14 DIAGNOSIS — J45909 Unspecified asthma, uncomplicated: Secondary | ICD-10-CM | POA: Diagnosis not present

## 2018-01-14 DIAGNOSIS — I10 Essential (primary) hypertension: Secondary | ICD-10-CM | POA: Diagnosis not present

## 2018-01-15 DIAGNOSIS — E662 Morbid (severe) obesity with alveolar hypoventilation: Secondary | ICD-10-CM | POA: Diagnosis not present

## 2018-01-15 DIAGNOSIS — I10 Essential (primary) hypertension: Secondary | ICD-10-CM | POA: Diagnosis not present

## 2018-01-15 DIAGNOSIS — J45909 Unspecified asthma, uncomplicated: Secondary | ICD-10-CM | POA: Diagnosis not present

## 2018-01-16 DIAGNOSIS — I10 Essential (primary) hypertension: Secondary | ICD-10-CM | POA: Diagnosis not present

## 2018-01-16 DIAGNOSIS — E662 Morbid (severe) obesity with alveolar hypoventilation: Secondary | ICD-10-CM | POA: Diagnosis not present

## 2018-01-16 DIAGNOSIS — J45909 Unspecified asthma, uncomplicated: Secondary | ICD-10-CM | POA: Diagnosis not present

## 2018-01-17 DIAGNOSIS — E662 Morbid (severe) obesity with alveolar hypoventilation: Secondary | ICD-10-CM | POA: Diagnosis not present

## 2018-01-17 DIAGNOSIS — J45909 Unspecified asthma, uncomplicated: Secondary | ICD-10-CM | POA: Diagnosis not present

## 2018-01-17 DIAGNOSIS — I10 Essential (primary) hypertension: Secondary | ICD-10-CM | POA: Diagnosis not present

## 2018-01-18 DIAGNOSIS — E662 Morbid (severe) obesity with alveolar hypoventilation: Secondary | ICD-10-CM | POA: Diagnosis not present

## 2018-01-18 DIAGNOSIS — I10 Essential (primary) hypertension: Secondary | ICD-10-CM | POA: Diagnosis not present

## 2018-01-18 DIAGNOSIS — J45909 Unspecified asthma, uncomplicated: Secondary | ICD-10-CM | POA: Diagnosis not present

## 2018-01-19 DIAGNOSIS — E662 Morbid (severe) obesity with alveolar hypoventilation: Secondary | ICD-10-CM | POA: Diagnosis not present

## 2018-01-19 DIAGNOSIS — J45909 Unspecified asthma, uncomplicated: Secondary | ICD-10-CM | POA: Diagnosis not present

## 2018-01-19 DIAGNOSIS — I10 Essential (primary) hypertension: Secondary | ICD-10-CM | POA: Diagnosis not present

## 2018-01-20 DIAGNOSIS — J45909 Unspecified asthma, uncomplicated: Secondary | ICD-10-CM | POA: Diagnosis not present

## 2018-01-20 DIAGNOSIS — I10 Essential (primary) hypertension: Secondary | ICD-10-CM | POA: Diagnosis not present

## 2018-01-20 DIAGNOSIS — E662 Morbid (severe) obesity with alveolar hypoventilation: Secondary | ICD-10-CM | POA: Diagnosis not present

## 2018-01-21 DIAGNOSIS — J45909 Unspecified asthma, uncomplicated: Secondary | ICD-10-CM | POA: Diagnosis not present

## 2018-01-21 DIAGNOSIS — E662 Morbid (severe) obesity with alveolar hypoventilation: Secondary | ICD-10-CM | POA: Diagnosis not present

## 2018-01-21 DIAGNOSIS — I10 Essential (primary) hypertension: Secondary | ICD-10-CM | POA: Diagnosis not present

## 2018-01-22 DIAGNOSIS — I10 Essential (primary) hypertension: Secondary | ICD-10-CM | POA: Diagnosis not present

## 2018-01-22 DIAGNOSIS — E662 Morbid (severe) obesity with alveolar hypoventilation: Secondary | ICD-10-CM | POA: Diagnosis not present

## 2018-01-22 DIAGNOSIS — J45909 Unspecified asthma, uncomplicated: Secondary | ICD-10-CM | POA: Diagnosis not present

## 2018-01-23 DIAGNOSIS — E662 Morbid (severe) obesity with alveolar hypoventilation: Secondary | ICD-10-CM | POA: Diagnosis not present

## 2018-01-23 DIAGNOSIS — J45909 Unspecified asthma, uncomplicated: Secondary | ICD-10-CM | POA: Diagnosis not present

## 2018-01-23 DIAGNOSIS — I10 Essential (primary) hypertension: Secondary | ICD-10-CM | POA: Diagnosis not present

## 2018-01-24 DIAGNOSIS — E662 Morbid (severe) obesity with alveolar hypoventilation: Secondary | ICD-10-CM | POA: Diagnosis not present

## 2018-01-24 DIAGNOSIS — I10 Essential (primary) hypertension: Secondary | ICD-10-CM | POA: Diagnosis not present

## 2018-01-24 DIAGNOSIS — J45909 Unspecified asthma, uncomplicated: Secondary | ICD-10-CM | POA: Diagnosis not present

## 2018-01-25 DIAGNOSIS — I10 Essential (primary) hypertension: Secondary | ICD-10-CM | POA: Diagnosis not present

## 2018-01-25 DIAGNOSIS — E662 Morbid (severe) obesity with alveolar hypoventilation: Secondary | ICD-10-CM | POA: Diagnosis not present

## 2018-01-25 DIAGNOSIS — J45909 Unspecified asthma, uncomplicated: Secondary | ICD-10-CM | POA: Diagnosis not present

## 2018-01-26 DIAGNOSIS — E662 Morbid (severe) obesity with alveolar hypoventilation: Secondary | ICD-10-CM | POA: Diagnosis not present

## 2018-01-26 DIAGNOSIS — I10 Essential (primary) hypertension: Secondary | ICD-10-CM | POA: Diagnosis not present

## 2018-01-26 DIAGNOSIS — J45909 Unspecified asthma, uncomplicated: Secondary | ICD-10-CM | POA: Diagnosis not present

## 2018-01-27 DIAGNOSIS — I10 Essential (primary) hypertension: Secondary | ICD-10-CM | POA: Diagnosis not present

## 2018-01-27 DIAGNOSIS — E662 Morbid (severe) obesity with alveolar hypoventilation: Secondary | ICD-10-CM | POA: Diagnosis not present

## 2018-01-27 DIAGNOSIS — J45909 Unspecified asthma, uncomplicated: Secondary | ICD-10-CM | POA: Diagnosis not present

## 2018-01-28 DIAGNOSIS — I10 Essential (primary) hypertension: Secondary | ICD-10-CM | POA: Diagnosis not present

## 2018-01-28 DIAGNOSIS — E662 Morbid (severe) obesity with alveolar hypoventilation: Secondary | ICD-10-CM | POA: Diagnosis not present

## 2018-01-28 DIAGNOSIS — J45909 Unspecified asthma, uncomplicated: Secondary | ICD-10-CM | POA: Diagnosis not present

## 2018-01-29 DIAGNOSIS — J45909 Unspecified asthma, uncomplicated: Secondary | ICD-10-CM | POA: Diagnosis not present

## 2018-01-29 DIAGNOSIS — I10 Essential (primary) hypertension: Secondary | ICD-10-CM | POA: Diagnosis not present

## 2018-01-29 DIAGNOSIS — E662 Morbid (severe) obesity with alveolar hypoventilation: Secondary | ICD-10-CM | POA: Diagnosis not present

## 2018-01-30 DIAGNOSIS — E662 Morbid (severe) obesity with alveolar hypoventilation: Secondary | ICD-10-CM | POA: Diagnosis not present

## 2018-01-30 DIAGNOSIS — I10 Essential (primary) hypertension: Secondary | ICD-10-CM | POA: Diagnosis not present

## 2018-01-30 DIAGNOSIS — J45909 Unspecified asthma, uncomplicated: Secondary | ICD-10-CM | POA: Diagnosis not present

## 2018-01-31 DIAGNOSIS — J45909 Unspecified asthma, uncomplicated: Secondary | ICD-10-CM | POA: Diagnosis not present

## 2018-01-31 DIAGNOSIS — I10 Essential (primary) hypertension: Secondary | ICD-10-CM | POA: Diagnosis not present

## 2018-01-31 DIAGNOSIS — E662 Morbid (severe) obesity with alveolar hypoventilation: Secondary | ICD-10-CM | POA: Diagnosis not present

## 2018-02-01 DIAGNOSIS — J45909 Unspecified asthma, uncomplicated: Secondary | ICD-10-CM | POA: Diagnosis not present

## 2018-02-01 DIAGNOSIS — E662 Morbid (severe) obesity with alveolar hypoventilation: Secondary | ICD-10-CM | POA: Diagnosis not present

## 2018-02-01 DIAGNOSIS — I10 Essential (primary) hypertension: Secondary | ICD-10-CM | POA: Diagnosis not present

## 2018-02-02 DIAGNOSIS — E662 Morbid (severe) obesity with alveolar hypoventilation: Secondary | ICD-10-CM | POA: Diagnosis not present

## 2018-02-02 DIAGNOSIS — J45909 Unspecified asthma, uncomplicated: Secondary | ICD-10-CM | POA: Diagnosis not present

## 2018-02-02 DIAGNOSIS — I10 Essential (primary) hypertension: Secondary | ICD-10-CM | POA: Diagnosis not present

## 2018-02-03 DIAGNOSIS — I10 Essential (primary) hypertension: Secondary | ICD-10-CM | POA: Diagnosis not present

## 2018-02-03 DIAGNOSIS — J45909 Unspecified asthma, uncomplicated: Secondary | ICD-10-CM | POA: Diagnosis not present

## 2018-02-03 DIAGNOSIS — E662 Morbid (severe) obesity with alveolar hypoventilation: Secondary | ICD-10-CM | POA: Diagnosis not present

## 2018-02-04 DIAGNOSIS — J45909 Unspecified asthma, uncomplicated: Secondary | ICD-10-CM | POA: Diagnosis not present

## 2018-02-04 DIAGNOSIS — I10 Essential (primary) hypertension: Secondary | ICD-10-CM | POA: Diagnosis not present

## 2018-02-04 DIAGNOSIS — E662 Morbid (severe) obesity with alveolar hypoventilation: Secondary | ICD-10-CM | POA: Diagnosis not present

## 2018-02-05 DIAGNOSIS — J45909 Unspecified asthma, uncomplicated: Secondary | ICD-10-CM | POA: Diagnosis not present

## 2018-02-05 DIAGNOSIS — E662 Morbid (severe) obesity with alveolar hypoventilation: Secondary | ICD-10-CM | POA: Diagnosis not present

## 2018-02-05 DIAGNOSIS — I10 Essential (primary) hypertension: Secondary | ICD-10-CM | POA: Diagnosis not present

## 2018-02-06 DIAGNOSIS — I10 Essential (primary) hypertension: Secondary | ICD-10-CM | POA: Diagnosis not present

## 2018-02-06 DIAGNOSIS — J45909 Unspecified asthma, uncomplicated: Secondary | ICD-10-CM | POA: Diagnosis not present

## 2018-02-06 DIAGNOSIS — E662 Morbid (severe) obesity with alveolar hypoventilation: Secondary | ICD-10-CM | POA: Diagnosis not present

## 2018-02-07 DIAGNOSIS — J45909 Unspecified asthma, uncomplicated: Secondary | ICD-10-CM | POA: Diagnosis not present

## 2018-02-07 DIAGNOSIS — I10 Essential (primary) hypertension: Secondary | ICD-10-CM | POA: Diagnosis not present

## 2018-02-07 DIAGNOSIS — E662 Morbid (severe) obesity with alveolar hypoventilation: Secondary | ICD-10-CM | POA: Diagnosis not present

## 2018-02-08 DIAGNOSIS — I10 Essential (primary) hypertension: Secondary | ICD-10-CM | POA: Diagnosis not present

## 2018-02-08 DIAGNOSIS — J45909 Unspecified asthma, uncomplicated: Secondary | ICD-10-CM | POA: Diagnosis not present

## 2018-02-08 DIAGNOSIS — E662 Morbid (severe) obesity with alveolar hypoventilation: Secondary | ICD-10-CM | POA: Diagnosis not present

## 2018-02-09 DIAGNOSIS — E662 Morbid (severe) obesity with alveolar hypoventilation: Secondary | ICD-10-CM | POA: Diagnosis not present

## 2018-02-09 DIAGNOSIS — I10 Essential (primary) hypertension: Secondary | ICD-10-CM | POA: Diagnosis not present

## 2018-02-09 DIAGNOSIS — J45909 Unspecified asthma, uncomplicated: Secondary | ICD-10-CM | POA: Diagnosis not present

## 2018-02-10 DIAGNOSIS — J45909 Unspecified asthma, uncomplicated: Secondary | ICD-10-CM | POA: Diagnosis not present

## 2018-02-10 DIAGNOSIS — I10 Essential (primary) hypertension: Secondary | ICD-10-CM | POA: Diagnosis not present

## 2018-02-10 DIAGNOSIS — E662 Morbid (severe) obesity with alveolar hypoventilation: Secondary | ICD-10-CM | POA: Diagnosis not present

## 2018-02-11 DIAGNOSIS — J45909 Unspecified asthma, uncomplicated: Secondary | ICD-10-CM | POA: Diagnosis not present

## 2018-02-11 DIAGNOSIS — I10 Essential (primary) hypertension: Secondary | ICD-10-CM | POA: Diagnosis not present

## 2018-02-11 DIAGNOSIS — E662 Morbid (severe) obesity with alveolar hypoventilation: Secondary | ICD-10-CM | POA: Diagnosis not present

## 2018-02-12 DIAGNOSIS — J45909 Unspecified asthma, uncomplicated: Secondary | ICD-10-CM | POA: Diagnosis not present

## 2018-02-12 DIAGNOSIS — I10 Essential (primary) hypertension: Secondary | ICD-10-CM | POA: Diagnosis not present

## 2018-02-12 DIAGNOSIS — E662 Morbid (severe) obesity with alveolar hypoventilation: Secondary | ICD-10-CM | POA: Diagnosis not present

## 2018-02-13 DIAGNOSIS — J45909 Unspecified asthma, uncomplicated: Secondary | ICD-10-CM | POA: Diagnosis not present

## 2018-02-13 DIAGNOSIS — E662 Morbid (severe) obesity with alveolar hypoventilation: Secondary | ICD-10-CM | POA: Diagnosis not present

## 2018-02-13 DIAGNOSIS — I10 Essential (primary) hypertension: Secondary | ICD-10-CM | POA: Diagnosis not present

## 2018-02-14 DIAGNOSIS — I10 Essential (primary) hypertension: Secondary | ICD-10-CM | POA: Diagnosis not present

## 2018-02-14 DIAGNOSIS — J45909 Unspecified asthma, uncomplicated: Secondary | ICD-10-CM | POA: Diagnosis not present

## 2018-02-14 DIAGNOSIS — E662 Morbid (severe) obesity with alveolar hypoventilation: Secondary | ICD-10-CM | POA: Diagnosis not present

## 2018-02-15 DIAGNOSIS — J45909 Unspecified asthma, uncomplicated: Secondary | ICD-10-CM | POA: Diagnosis not present

## 2018-02-15 DIAGNOSIS — E662 Morbid (severe) obesity with alveolar hypoventilation: Secondary | ICD-10-CM | POA: Diagnosis not present

## 2018-02-15 DIAGNOSIS — I10 Essential (primary) hypertension: Secondary | ICD-10-CM | POA: Diagnosis not present

## 2018-02-16 DIAGNOSIS — J45909 Unspecified asthma, uncomplicated: Secondary | ICD-10-CM | POA: Diagnosis not present

## 2018-02-16 DIAGNOSIS — E662 Morbid (severe) obesity with alveolar hypoventilation: Secondary | ICD-10-CM | POA: Diagnosis not present

## 2018-02-16 DIAGNOSIS — I10 Essential (primary) hypertension: Secondary | ICD-10-CM | POA: Diagnosis not present

## 2018-02-17 DIAGNOSIS — E662 Morbid (severe) obesity with alveolar hypoventilation: Secondary | ICD-10-CM | POA: Diagnosis not present

## 2018-02-17 DIAGNOSIS — J45909 Unspecified asthma, uncomplicated: Secondary | ICD-10-CM | POA: Diagnosis not present

## 2018-02-17 DIAGNOSIS — I10 Essential (primary) hypertension: Secondary | ICD-10-CM | POA: Diagnosis not present

## 2018-02-18 DIAGNOSIS — E662 Morbid (severe) obesity with alveolar hypoventilation: Secondary | ICD-10-CM | POA: Diagnosis not present

## 2018-02-18 DIAGNOSIS — J45909 Unspecified asthma, uncomplicated: Secondary | ICD-10-CM | POA: Diagnosis not present

## 2018-02-18 DIAGNOSIS — I10 Essential (primary) hypertension: Secondary | ICD-10-CM | POA: Diagnosis not present

## 2018-02-19 DIAGNOSIS — I10 Essential (primary) hypertension: Secondary | ICD-10-CM | POA: Diagnosis not present

## 2018-02-19 DIAGNOSIS — J45909 Unspecified asthma, uncomplicated: Secondary | ICD-10-CM | POA: Diagnosis not present

## 2018-02-19 DIAGNOSIS — E662 Morbid (severe) obesity with alveolar hypoventilation: Secondary | ICD-10-CM | POA: Diagnosis not present

## 2018-02-20 DIAGNOSIS — J45909 Unspecified asthma, uncomplicated: Secondary | ICD-10-CM | POA: Diagnosis not present

## 2018-02-20 DIAGNOSIS — I10 Essential (primary) hypertension: Secondary | ICD-10-CM | POA: Diagnosis not present

## 2018-02-20 DIAGNOSIS — E662 Morbid (severe) obesity with alveolar hypoventilation: Secondary | ICD-10-CM | POA: Diagnosis not present

## 2018-02-21 DIAGNOSIS — J45909 Unspecified asthma, uncomplicated: Secondary | ICD-10-CM | POA: Diagnosis not present

## 2018-02-21 DIAGNOSIS — E662 Morbid (severe) obesity with alveolar hypoventilation: Secondary | ICD-10-CM | POA: Diagnosis not present

## 2018-02-21 DIAGNOSIS — I10 Essential (primary) hypertension: Secondary | ICD-10-CM | POA: Diagnosis not present

## 2018-02-22 DIAGNOSIS — J45909 Unspecified asthma, uncomplicated: Secondary | ICD-10-CM | POA: Diagnosis not present

## 2018-02-22 DIAGNOSIS — E662 Morbid (severe) obesity with alveolar hypoventilation: Secondary | ICD-10-CM | POA: Diagnosis not present

## 2018-02-22 DIAGNOSIS — I10 Essential (primary) hypertension: Secondary | ICD-10-CM | POA: Diagnosis not present

## 2018-02-23 DIAGNOSIS — J45909 Unspecified asthma, uncomplicated: Secondary | ICD-10-CM | POA: Diagnosis not present

## 2018-02-23 DIAGNOSIS — E662 Morbid (severe) obesity with alveolar hypoventilation: Secondary | ICD-10-CM | POA: Diagnosis not present

## 2018-02-23 DIAGNOSIS — I10 Essential (primary) hypertension: Secondary | ICD-10-CM | POA: Diagnosis not present

## 2018-02-24 DIAGNOSIS — J45909 Unspecified asthma, uncomplicated: Secondary | ICD-10-CM | POA: Diagnosis not present

## 2018-02-24 DIAGNOSIS — E662 Morbid (severe) obesity with alveolar hypoventilation: Secondary | ICD-10-CM | POA: Diagnosis not present

## 2018-02-24 DIAGNOSIS — I10 Essential (primary) hypertension: Secondary | ICD-10-CM | POA: Diagnosis not present

## 2018-02-25 DIAGNOSIS — E662 Morbid (severe) obesity with alveolar hypoventilation: Secondary | ICD-10-CM | POA: Diagnosis not present

## 2018-02-25 DIAGNOSIS — J45909 Unspecified asthma, uncomplicated: Secondary | ICD-10-CM | POA: Diagnosis not present

## 2018-02-25 DIAGNOSIS — I10 Essential (primary) hypertension: Secondary | ICD-10-CM | POA: Diagnosis not present

## 2018-02-26 DIAGNOSIS — I10 Essential (primary) hypertension: Secondary | ICD-10-CM | POA: Diagnosis not present

## 2018-02-26 DIAGNOSIS — J45909 Unspecified asthma, uncomplicated: Secondary | ICD-10-CM | POA: Diagnosis not present

## 2018-02-26 DIAGNOSIS — E662 Morbid (severe) obesity with alveolar hypoventilation: Secondary | ICD-10-CM | POA: Diagnosis not present

## 2018-02-27 DIAGNOSIS — J45909 Unspecified asthma, uncomplicated: Secondary | ICD-10-CM | POA: Diagnosis not present

## 2018-02-27 DIAGNOSIS — I10 Essential (primary) hypertension: Secondary | ICD-10-CM | POA: Diagnosis not present

## 2018-02-27 DIAGNOSIS — E662 Morbid (severe) obesity with alveolar hypoventilation: Secondary | ICD-10-CM | POA: Diagnosis not present

## 2018-02-28 DIAGNOSIS — J45909 Unspecified asthma, uncomplicated: Secondary | ICD-10-CM | POA: Diagnosis not present

## 2018-02-28 DIAGNOSIS — I10 Essential (primary) hypertension: Secondary | ICD-10-CM | POA: Diagnosis not present

## 2018-02-28 DIAGNOSIS — E662 Morbid (severe) obesity with alveolar hypoventilation: Secondary | ICD-10-CM | POA: Diagnosis not present

## 2018-03-01 DIAGNOSIS — I10 Essential (primary) hypertension: Secondary | ICD-10-CM | POA: Diagnosis not present

## 2018-03-01 DIAGNOSIS — J45909 Unspecified asthma, uncomplicated: Secondary | ICD-10-CM | POA: Diagnosis not present

## 2018-03-01 DIAGNOSIS — E662 Morbid (severe) obesity with alveolar hypoventilation: Secondary | ICD-10-CM | POA: Diagnosis not present

## 2018-03-02 DIAGNOSIS — I10 Essential (primary) hypertension: Secondary | ICD-10-CM | POA: Diagnosis not present

## 2018-03-02 DIAGNOSIS — E662 Morbid (severe) obesity with alveolar hypoventilation: Secondary | ICD-10-CM | POA: Diagnosis not present

## 2018-03-02 DIAGNOSIS — J45909 Unspecified asthma, uncomplicated: Secondary | ICD-10-CM | POA: Diagnosis not present

## 2018-03-03 DIAGNOSIS — J45909 Unspecified asthma, uncomplicated: Secondary | ICD-10-CM | POA: Diagnosis not present

## 2018-03-03 DIAGNOSIS — E662 Morbid (severe) obesity with alveolar hypoventilation: Secondary | ICD-10-CM | POA: Diagnosis not present

## 2018-03-03 DIAGNOSIS — I10 Essential (primary) hypertension: Secondary | ICD-10-CM | POA: Diagnosis not present

## 2018-03-04 DIAGNOSIS — J45909 Unspecified asthma, uncomplicated: Secondary | ICD-10-CM | POA: Diagnosis not present

## 2018-03-04 DIAGNOSIS — I10 Essential (primary) hypertension: Secondary | ICD-10-CM | POA: Diagnosis not present

## 2018-03-04 DIAGNOSIS — E662 Morbid (severe) obesity with alveolar hypoventilation: Secondary | ICD-10-CM | POA: Diagnosis not present

## 2018-03-05 DIAGNOSIS — I10 Essential (primary) hypertension: Secondary | ICD-10-CM | POA: Diagnosis not present

## 2018-03-05 DIAGNOSIS — E662 Morbid (severe) obesity with alveolar hypoventilation: Secondary | ICD-10-CM | POA: Diagnosis not present

## 2018-03-05 DIAGNOSIS — J45909 Unspecified asthma, uncomplicated: Secondary | ICD-10-CM | POA: Diagnosis not present

## 2018-03-06 DIAGNOSIS — J45909 Unspecified asthma, uncomplicated: Secondary | ICD-10-CM | POA: Diagnosis not present

## 2018-03-06 DIAGNOSIS — I10 Essential (primary) hypertension: Secondary | ICD-10-CM | POA: Diagnosis not present

## 2018-03-06 DIAGNOSIS — E662 Morbid (severe) obesity with alveolar hypoventilation: Secondary | ICD-10-CM | POA: Diagnosis not present

## 2018-03-07 DIAGNOSIS — E662 Morbid (severe) obesity with alveolar hypoventilation: Secondary | ICD-10-CM | POA: Diagnosis not present

## 2018-03-07 DIAGNOSIS — I10 Essential (primary) hypertension: Secondary | ICD-10-CM | POA: Diagnosis not present

## 2018-03-07 DIAGNOSIS — J45909 Unspecified asthma, uncomplicated: Secondary | ICD-10-CM | POA: Diagnosis not present

## 2018-03-08 DIAGNOSIS — J45909 Unspecified asthma, uncomplicated: Secondary | ICD-10-CM | POA: Diagnosis not present

## 2018-03-08 DIAGNOSIS — E662 Morbid (severe) obesity with alveolar hypoventilation: Secondary | ICD-10-CM | POA: Diagnosis not present

## 2018-03-08 DIAGNOSIS — I10 Essential (primary) hypertension: Secondary | ICD-10-CM | POA: Diagnosis not present

## 2018-03-09 DIAGNOSIS — E662 Morbid (severe) obesity with alveolar hypoventilation: Secondary | ICD-10-CM | POA: Diagnosis not present

## 2018-03-09 DIAGNOSIS — I10 Essential (primary) hypertension: Secondary | ICD-10-CM | POA: Diagnosis not present

## 2018-03-09 DIAGNOSIS — J45909 Unspecified asthma, uncomplicated: Secondary | ICD-10-CM | POA: Diagnosis not present

## 2018-03-10 DIAGNOSIS — J45909 Unspecified asthma, uncomplicated: Secondary | ICD-10-CM | POA: Diagnosis not present

## 2018-03-10 DIAGNOSIS — E662 Morbid (severe) obesity with alveolar hypoventilation: Secondary | ICD-10-CM | POA: Diagnosis not present

## 2018-03-10 DIAGNOSIS — I10 Essential (primary) hypertension: Secondary | ICD-10-CM | POA: Diagnosis not present

## 2018-03-11 DIAGNOSIS — J45909 Unspecified asthma, uncomplicated: Secondary | ICD-10-CM | POA: Diagnosis not present

## 2018-03-11 DIAGNOSIS — E662 Morbid (severe) obesity with alveolar hypoventilation: Secondary | ICD-10-CM | POA: Diagnosis not present

## 2018-03-11 DIAGNOSIS — I10 Essential (primary) hypertension: Secondary | ICD-10-CM | POA: Diagnosis not present

## 2018-03-12 DIAGNOSIS — J45909 Unspecified asthma, uncomplicated: Secondary | ICD-10-CM | POA: Diagnosis not present

## 2018-03-12 DIAGNOSIS — I10 Essential (primary) hypertension: Secondary | ICD-10-CM | POA: Diagnosis not present

## 2018-03-12 DIAGNOSIS — E662 Morbid (severe) obesity with alveolar hypoventilation: Secondary | ICD-10-CM | POA: Diagnosis not present

## 2018-03-13 DIAGNOSIS — J45909 Unspecified asthma, uncomplicated: Secondary | ICD-10-CM | POA: Diagnosis not present

## 2018-03-13 DIAGNOSIS — E662 Morbid (severe) obesity with alveolar hypoventilation: Secondary | ICD-10-CM | POA: Diagnosis not present

## 2018-03-13 DIAGNOSIS — I10 Essential (primary) hypertension: Secondary | ICD-10-CM | POA: Diagnosis not present

## 2018-03-14 DIAGNOSIS — E662 Morbid (severe) obesity with alveolar hypoventilation: Secondary | ICD-10-CM | POA: Diagnosis not present

## 2018-03-14 DIAGNOSIS — I10 Essential (primary) hypertension: Secondary | ICD-10-CM | POA: Diagnosis not present

## 2018-03-14 DIAGNOSIS — J45909 Unspecified asthma, uncomplicated: Secondary | ICD-10-CM | POA: Diagnosis not present

## 2018-03-15 DIAGNOSIS — J45909 Unspecified asthma, uncomplicated: Secondary | ICD-10-CM | POA: Diagnosis not present

## 2018-03-15 DIAGNOSIS — E662 Morbid (severe) obesity with alveolar hypoventilation: Secondary | ICD-10-CM | POA: Diagnosis not present

## 2018-03-15 DIAGNOSIS — I10 Essential (primary) hypertension: Secondary | ICD-10-CM | POA: Diagnosis not present

## 2018-03-16 DIAGNOSIS — I10 Essential (primary) hypertension: Secondary | ICD-10-CM | POA: Diagnosis not present

## 2018-03-16 DIAGNOSIS — J45909 Unspecified asthma, uncomplicated: Secondary | ICD-10-CM | POA: Diagnosis not present

## 2018-03-16 DIAGNOSIS — E662 Morbid (severe) obesity with alveolar hypoventilation: Secondary | ICD-10-CM | POA: Diagnosis not present

## 2018-03-17 DIAGNOSIS — I10 Essential (primary) hypertension: Secondary | ICD-10-CM | POA: Diagnosis not present

## 2018-03-17 DIAGNOSIS — J45909 Unspecified asthma, uncomplicated: Secondary | ICD-10-CM | POA: Diagnosis not present

## 2018-03-17 DIAGNOSIS — E662 Morbid (severe) obesity with alveolar hypoventilation: Secondary | ICD-10-CM | POA: Diagnosis not present

## 2018-03-18 DIAGNOSIS — I10 Essential (primary) hypertension: Secondary | ICD-10-CM | POA: Diagnosis not present

## 2018-03-18 DIAGNOSIS — J45909 Unspecified asthma, uncomplicated: Secondary | ICD-10-CM | POA: Diagnosis not present

## 2018-03-18 DIAGNOSIS — E662 Morbid (severe) obesity with alveolar hypoventilation: Secondary | ICD-10-CM | POA: Diagnosis not present

## 2018-03-19 DIAGNOSIS — E662 Morbid (severe) obesity with alveolar hypoventilation: Secondary | ICD-10-CM | POA: Diagnosis not present

## 2018-03-19 DIAGNOSIS — J45909 Unspecified asthma, uncomplicated: Secondary | ICD-10-CM | POA: Diagnosis not present

## 2018-03-19 DIAGNOSIS — I10 Essential (primary) hypertension: Secondary | ICD-10-CM | POA: Diagnosis not present

## 2018-03-20 DIAGNOSIS — E662 Morbid (severe) obesity with alveolar hypoventilation: Secondary | ICD-10-CM | POA: Diagnosis not present

## 2018-03-20 DIAGNOSIS — J45909 Unspecified asthma, uncomplicated: Secondary | ICD-10-CM | POA: Diagnosis not present

## 2018-03-20 DIAGNOSIS — I10 Essential (primary) hypertension: Secondary | ICD-10-CM | POA: Diagnosis not present

## 2018-03-21 DIAGNOSIS — J45909 Unspecified asthma, uncomplicated: Secondary | ICD-10-CM | POA: Diagnosis not present

## 2018-03-21 DIAGNOSIS — E662 Morbid (severe) obesity with alveolar hypoventilation: Secondary | ICD-10-CM | POA: Diagnosis not present

## 2018-03-21 DIAGNOSIS — I10 Essential (primary) hypertension: Secondary | ICD-10-CM | POA: Diagnosis not present

## 2018-03-22 DIAGNOSIS — J45909 Unspecified asthma, uncomplicated: Secondary | ICD-10-CM | POA: Diagnosis not present

## 2018-03-22 DIAGNOSIS — I10 Essential (primary) hypertension: Secondary | ICD-10-CM | POA: Diagnosis not present

## 2018-03-22 DIAGNOSIS — E662 Morbid (severe) obesity with alveolar hypoventilation: Secondary | ICD-10-CM | POA: Diagnosis not present

## 2018-03-23 DIAGNOSIS — J45909 Unspecified asthma, uncomplicated: Secondary | ICD-10-CM | POA: Diagnosis not present

## 2018-03-23 DIAGNOSIS — E662 Morbid (severe) obesity with alveolar hypoventilation: Secondary | ICD-10-CM | POA: Diagnosis not present

## 2018-03-23 DIAGNOSIS — I10 Essential (primary) hypertension: Secondary | ICD-10-CM | POA: Diagnosis not present

## 2018-03-24 DIAGNOSIS — I10 Essential (primary) hypertension: Secondary | ICD-10-CM | POA: Diagnosis not present

## 2018-03-24 DIAGNOSIS — J45909 Unspecified asthma, uncomplicated: Secondary | ICD-10-CM | POA: Diagnosis not present

## 2018-03-24 DIAGNOSIS — E662 Morbid (severe) obesity with alveolar hypoventilation: Secondary | ICD-10-CM | POA: Diagnosis not present

## 2018-03-25 DIAGNOSIS — J45909 Unspecified asthma, uncomplicated: Secondary | ICD-10-CM | POA: Diagnosis not present

## 2018-03-25 DIAGNOSIS — I10 Essential (primary) hypertension: Secondary | ICD-10-CM | POA: Diagnosis not present

## 2018-03-25 DIAGNOSIS — E662 Morbid (severe) obesity with alveolar hypoventilation: Secondary | ICD-10-CM | POA: Diagnosis not present

## 2018-03-26 DIAGNOSIS — J45909 Unspecified asthma, uncomplicated: Secondary | ICD-10-CM | POA: Diagnosis not present

## 2018-03-26 DIAGNOSIS — E662 Morbid (severe) obesity with alveolar hypoventilation: Secondary | ICD-10-CM | POA: Diagnosis not present

## 2018-03-26 DIAGNOSIS — I10 Essential (primary) hypertension: Secondary | ICD-10-CM | POA: Diagnosis not present

## 2018-03-27 DIAGNOSIS — E662 Morbid (severe) obesity with alveolar hypoventilation: Secondary | ICD-10-CM | POA: Diagnosis not present

## 2018-03-27 DIAGNOSIS — I10 Essential (primary) hypertension: Secondary | ICD-10-CM | POA: Diagnosis not present

## 2018-03-27 DIAGNOSIS — J45909 Unspecified asthma, uncomplicated: Secondary | ICD-10-CM | POA: Diagnosis not present

## 2018-03-28 DIAGNOSIS — E662 Morbid (severe) obesity with alveolar hypoventilation: Secondary | ICD-10-CM | POA: Diagnosis not present

## 2018-03-28 DIAGNOSIS — J45909 Unspecified asthma, uncomplicated: Secondary | ICD-10-CM | POA: Diagnosis not present

## 2018-03-28 DIAGNOSIS — I10 Essential (primary) hypertension: Secondary | ICD-10-CM | POA: Diagnosis not present

## 2018-03-29 DIAGNOSIS — E662 Morbid (severe) obesity with alveolar hypoventilation: Secondary | ICD-10-CM | POA: Diagnosis not present

## 2018-03-29 DIAGNOSIS — J45909 Unspecified asthma, uncomplicated: Secondary | ICD-10-CM | POA: Diagnosis not present

## 2018-03-29 DIAGNOSIS — I10 Essential (primary) hypertension: Secondary | ICD-10-CM | POA: Diagnosis not present

## 2018-03-30 DIAGNOSIS — I10 Essential (primary) hypertension: Secondary | ICD-10-CM | POA: Diagnosis not present

## 2018-03-30 DIAGNOSIS — J45909 Unspecified asthma, uncomplicated: Secondary | ICD-10-CM | POA: Diagnosis not present

## 2018-03-30 DIAGNOSIS — E662 Morbid (severe) obesity with alveolar hypoventilation: Secondary | ICD-10-CM | POA: Diagnosis not present

## 2018-03-31 DIAGNOSIS — J45909 Unspecified asthma, uncomplicated: Secondary | ICD-10-CM | POA: Diagnosis not present

## 2018-03-31 DIAGNOSIS — E662 Morbid (severe) obesity with alveolar hypoventilation: Secondary | ICD-10-CM | POA: Diagnosis not present

## 2018-03-31 DIAGNOSIS — I10 Essential (primary) hypertension: Secondary | ICD-10-CM | POA: Diagnosis not present

## 2018-04-01 DIAGNOSIS — I10 Essential (primary) hypertension: Secondary | ICD-10-CM | POA: Diagnosis not present

## 2018-04-01 DIAGNOSIS — E662 Morbid (severe) obesity with alveolar hypoventilation: Secondary | ICD-10-CM | POA: Diagnosis not present

## 2018-04-01 DIAGNOSIS — J45909 Unspecified asthma, uncomplicated: Secondary | ICD-10-CM | POA: Diagnosis not present

## 2018-04-02 DIAGNOSIS — J45909 Unspecified asthma, uncomplicated: Secondary | ICD-10-CM | POA: Diagnosis not present

## 2018-04-02 DIAGNOSIS — E662 Morbid (severe) obesity with alveolar hypoventilation: Secondary | ICD-10-CM | POA: Diagnosis not present

## 2018-04-02 DIAGNOSIS — I10 Essential (primary) hypertension: Secondary | ICD-10-CM | POA: Diagnosis not present

## 2018-04-03 DIAGNOSIS — J45909 Unspecified asthma, uncomplicated: Secondary | ICD-10-CM | POA: Diagnosis not present

## 2018-04-03 DIAGNOSIS — E662 Morbid (severe) obesity with alveolar hypoventilation: Secondary | ICD-10-CM | POA: Diagnosis not present

## 2018-04-03 DIAGNOSIS — I10 Essential (primary) hypertension: Secondary | ICD-10-CM | POA: Diagnosis not present

## 2018-04-04 DIAGNOSIS — I10 Essential (primary) hypertension: Secondary | ICD-10-CM | POA: Diagnosis not present

## 2018-04-04 DIAGNOSIS — J45909 Unspecified asthma, uncomplicated: Secondary | ICD-10-CM | POA: Diagnosis not present

## 2018-04-04 DIAGNOSIS — E662 Morbid (severe) obesity with alveolar hypoventilation: Secondary | ICD-10-CM | POA: Diagnosis not present

## 2018-04-05 DIAGNOSIS — E662 Morbid (severe) obesity with alveolar hypoventilation: Secondary | ICD-10-CM | POA: Diagnosis not present

## 2018-04-05 DIAGNOSIS — J45909 Unspecified asthma, uncomplicated: Secondary | ICD-10-CM | POA: Diagnosis not present

## 2018-04-05 DIAGNOSIS — I10 Essential (primary) hypertension: Secondary | ICD-10-CM | POA: Diagnosis not present

## 2018-04-06 DIAGNOSIS — E662 Morbid (severe) obesity with alveolar hypoventilation: Secondary | ICD-10-CM | POA: Diagnosis not present

## 2018-04-06 DIAGNOSIS — J45909 Unspecified asthma, uncomplicated: Secondary | ICD-10-CM | POA: Diagnosis not present

## 2018-04-06 DIAGNOSIS — I10 Essential (primary) hypertension: Secondary | ICD-10-CM | POA: Diagnosis not present

## 2018-04-07 DIAGNOSIS — I10 Essential (primary) hypertension: Secondary | ICD-10-CM | POA: Diagnosis not present

## 2018-04-07 DIAGNOSIS — J45909 Unspecified asthma, uncomplicated: Secondary | ICD-10-CM | POA: Diagnosis not present

## 2018-04-07 DIAGNOSIS — E662 Morbid (severe) obesity with alveolar hypoventilation: Secondary | ICD-10-CM | POA: Diagnosis not present

## 2018-04-08 DIAGNOSIS — E662 Morbid (severe) obesity with alveolar hypoventilation: Secondary | ICD-10-CM | POA: Diagnosis not present

## 2018-04-08 DIAGNOSIS — I10 Essential (primary) hypertension: Secondary | ICD-10-CM | POA: Diagnosis not present

## 2018-04-08 DIAGNOSIS — J45909 Unspecified asthma, uncomplicated: Secondary | ICD-10-CM | POA: Diagnosis not present

## 2018-04-09 DIAGNOSIS — J45909 Unspecified asthma, uncomplicated: Secondary | ICD-10-CM | POA: Diagnosis not present

## 2018-04-09 DIAGNOSIS — I10 Essential (primary) hypertension: Secondary | ICD-10-CM | POA: Diagnosis not present

## 2018-04-09 DIAGNOSIS — E662 Morbid (severe) obesity with alveolar hypoventilation: Secondary | ICD-10-CM | POA: Diagnosis not present

## 2018-04-10 DIAGNOSIS — J45909 Unspecified asthma, uncomplicated: Secondary | ICD-10-CM | POA: Diagnosis not present

## 2018-04-10 DIAGNOSIS — E662 Morbid (severe) obesity with alveolar hypoventilation: Secondary | ICD-10-CM | POA: Diagnosis not present

## 2018-04-10 DIAGNOSIS — I10 Essential (primary) hypertension: Secondary | ICD-10-CM | POA: Diagnosis not present

## 2018-04-11 DIAGNOSIS — J45909 Unspecified asthma, uncomplicated: Secondary | ICD-10-CM | POA: Diagnosis not present

## 2018-04-11 DIAGNOSIS — I10 Essential (primary) hypertension: Secondary | ICD-10-CM | POA: Diagnosis not present

## 2018-04-11 DIAGNOSIS — E662 Morbid (severe) obesity with alveolar hypoventilation: Secondary | ICD-10-CM | POA: Diagnosis not present

## 2018-04-12 DIAGNOSIS — J45909 Unspecified asthma, uncomplicated: Secondary | ICD-10-CM | POA: Diagnosis not present

## 2018-04-12 DIAGNOSIS — E662 Morbid (severe) obesity with alveolar hypoventilation: Secondary | ICD-10-CM | POA: Diagnosis not present

## 2018-04-12 DIAGNOSIS — I10 Essential (primary) hypertension: Secondary | ICD-10-CM | POA: Diagnosis not present

## 2018-04-13 DIAGNOSIS — J45909 Unspecified asthma, uncomplicated: Secondary | ICD-10-CM | POA: Diagnosis not present

## 2018-04-13 DIAGNOSIS — I10 Essential (primary) hypertension: Secondary | ICD-10-CM | POA: Diagnosis not present

## 2018-04-13 DIAGNOSIS — E662 Morbid (severe) obesity with alveolar hypoventilation: Secondary | ICD-10-CM | POA: Diagnosis not present

## 2018-04-14 DIAGNOSIS — I10 Essential (primary) hypertension: Secondary | ICD-10-CM | POA: Diagnosis not present

## 2018-04-14 DIAGNOSIS — J45909 Unspecified asthma, uncomplicated: Secondary | ICD-10-CM | POA: Diagnosis not present

## 2018-04-14 DIAGNOSIS — E662 Morbid (severe) obesity with alveolar hypoventilation: Secondary | ICD-10-CM | POA: Diagnosis not present

## 2018-04-15 DIAGNOSIS — E662 Morbid (severe) obesity with alveolar hypoventilation: Secondary | ICD-10-CM | POA: Diagnosis not present

## 2018-04-15 DIAGNOSIS — I10 Essential (primary) hypertension: Secondary | ICD-10-CM | POA: Diagnosis not present

## 2018-04-15 DIAGNOSIS — J45909 Unspecified asthma, uncomplicated: Secondary | ICD-10-CM | POA: Diagnosis not present

## 2018-04-16 DIAGNOSIS — E662 Morbid (severe) obesity with alveolar hypoventilation: Secondary | ICD-10-CM | POA: Diagnosis not present

## 2018-04-16 DIAGNOSIS — J45909 Unspecified asthma, uncomplicated: Secondary | ICD-10-CM | POA: Diagnosis not present

## 2018-04-16 DIAGNOSIS — I10 Essential (primary) hypertension: Secondary | ICD-10-CM | POA: Diagnosis not present

## 2018-04-17 DIAGNOSIS — E662 Morbid (severe) obesity with alveolar hypoventilation: Secondary | ICD-10-CM | POA: Diagnosis not present

## 2018-04-17 DIAGNOSIS — I10 Essential (primary) hypertension: Secondary | ICD-10-CM | POA: Diagnosis not present

## 2018-04-17 DIAGNOSIS — J45909 Unspecified asthma, uncomplicated: Secondary | ICD-10-CM | POA: Diagnosis not present

## 2018-04-18 DIAGNOSIS — J45909 Unspecified asthma, uncomplicated: Secondary | ICD-10-CM | POA: Diagnosis not present

## 2018-04-18 DIAGNOSIS — E662 Morbid (severe) obesity with alveolar hypoventilation: Secondary | ICD-10-CM | POA: Diagnosis not present

## 2018-04-18 DIAGNOSIS — I10 Essential (primary) hypertension: Secondary | ICD-10-CM | POA: Diagnosis not present

## 2018-04-19 DIAGNOSIS — E662 Morbid (severe) obesity with alveolar hypoventilation: Secondary | ICD-10-CM | POA: Diagnosis not present

## 2018-04-19 DIAGNOSIS — I10 Essential (primary) hypertension: Secondary | ICD-10-CM | POA: Diagnosis not present

## 2018-04-19 DIAGNOSIS — J45909 Unspecified asthma, uncomplicated: Secondary | ICD-10-CM | POA: Diagnosis not present

## 2018-04-20 DIAGNOSIS — I10 Essential (primary) hypertension: Secondary | ICD-10-CM | POA: Diagnosis not present

## 2018-04-20 DIAGNOSIS — J45909 Unspecified asthma, uncomplicated: Secondary | ICD-10-CM | POA: Diagnosis not present

## 2018-04-20 DIAGNOSIS — E662 Morbid (severe) obesity with alveolar hypoventilation: Secondary | ICD-10-CM | POA: Diagnosis not present

## 2018-04-21 DIAGNOSIS — E662 Morbid (severe) obesity with alveolar hypoventilation: Secondary | ICD-10-CM | POA: Diagnosis not present

## 2018-04-21 DIAGNOSIS — J45909 Unspecified asthma, uncomplicated: Secondary | ICD-10-CM | POA: Diagnosis not present

## 2018-04-21 DIAGNOSIS — I10 Essential (primary) hypertension: Secondary | ICD-10-CM | POA: Diagnosis not present

## 2018-04-22 DIAGNOSIS — I10 Essential (primary) hypertension: Secondary | ICD-10-CM | POA: Diagnosis not present

## 2018-04-22 DIAGNOSIS — E662 Morbid (severe) obesity with alveolar hypoventilation: Secondary | ICD-10-CM | POA: Diagnosis not present

## 2018-04-22 DIAGNOSIS — J45909 Unspecified asthma, uncomplicated: Secondary | ICD-10-CM | POA: Diagnosis not present

## 2018-04-23 DIAGNOSIS — E662 Morbid (severe) obesity with alveolar hypoventilation: Secondary | ICD-10-CM | POA: Diagnosis not present

## 2018-04-23 DIAGNOSIS — I10 Essential (primary) hypertension: Secondary | ICD-10-CM | POA: Diagnosis not present

## 2018-04-23 DIAGNOSIS — J45909 Unspecified asthma, uncomplicated: Secondary | ICD-10-CM | POA: Diagnosis not present

## 2018-04-24 DIAGNOSIS — I10 Essential (primary) hypertension: Secondary | ICD-10-CM | POA: Diagnosis not present

## 2018-04-24 DIAGNOSIS — J45909 Unspecified asthma, uncomplicated: Secondary | ICD-10-CM | POA: Diagnosis not present

## 2018-04-24 DIAGNOSIS — E662 Morbid (severe) obesity with alveolar hypoventilation: Secondary | ICD-10-CM | POA: Diagnosis not present

## 2018-04-25 DIAGNOSIS — I10 Essential (primary) hypertension: Secondary | ICD-10-CM | POA: Diagnosis not present

## 2018-04-25 DIAGNOSIS — E662 Morbid (severe) obesity with alveolar hypoventilation: Secondary | ICD-10-CM | POA: Diagnosis not present

## 2018-04-25 DIAGNOSIS — J45909 Unspecified asthma, uncomplicated: Secondary | ICD-10-CM | POA: Diagnosis not present

## 2018-04-26 DIAGNOSIS — J45909 Unspecified asthma, uncomplicated: Secondary | ICD-10-CM | POA: Diagnosis not present

## 2018-04-26 DIAGNOSIS — I10 Essential (primary) hypertension: Secondary | ICD-10-CM | POA: Diagnosis not present

## 2018-04-26 DIAGNOSIS — E662 Morbid (severe) obesity with alveolar hypoventilation: Secondary | ICD-10-CM | POA: Diagnosis not present

## 2018-04-27 DIAGNOSIS — E662 Morbid (severe) obesity with alveolar hypoventilation: Secondary | ICD-10-CM | POA: Diagnosis not present

## 2018-04-27 DIAGNOSIS — J45909 Unspecified asthma, uncomplicated: Secondary | ICD-10-CM | POA: Diagnosis not present

## 2018-04-27 DIAGNOSIS — I10 Essential (primary) hypertension: Secondary | ICD-10-CM | POA: Diagnosis not present

## 2018-04-28 DIAGNOSIS — I10 Essential (primary) hypertension: Secondary | ICD-10-CM | POA: Diagnosis not present

## 2018-04-28 DIAGNOSIS — E662 Morbid (severe) obesity with alveolar hypoventilation: Secondary | ICD-10-CM | POA: Diagnosis not present

## 2018-04-28 DIAGNOSIS — J45909 Unspecified asthma, uncomplicated: Secondary | ICD-10-CM | POA: Diagnosis not present

## 2018-04-29 DIAGNOSIS — J45909 Unspecified asthma, uncomplicated: Secondary | ICD-10-CM | POA: Diagnosis not present

## 2018-04-29 DIAGNOSIS — E662 Morbid (severe) obesity with alveolar hypoventilation: Secondary | ICD-10-CM | POA: Diagnosis not present

## 2018-04-29 DIAGNOSIS — I10 Essential (primary) hypertension: Secondary | ICD-10-CM | POA: Diagnosis not present

## 2018-04-30 DIAGNOSIS — J45909 Unspecified asthma, uncomplicated: Secondary | ICD-10-CM | POA: Diagnosis not present

## 2018-04-30 DIAGNOSIS — I10 Essential (primary) hypertension: Secondary | ICD-10-CM | POA: Diagnosis not present

## 2018-04-30 DIAGNOSIS — E662 Morbid (severe) obesity with alveolar hypoventilation: Secondary | ICD-10-CM | POA: Diagnosis not present

## 2018-05-01 DIAGNOSIS — E662 Morbid (severe) obesity with alveolar hypoventilation: Secondary | ICD-10-CM | POA: Diagnosis not present

## 2018-05-01 DIAGNOSIS — I10 Essential (primary) hypertension: Secondary | ICD-10-CM | POA: Diagnosis not present

## 2018-05-01 DIAGNOSIS — J45909 Unspecified asthma, uncomplicated: Secondary | ICD-10-CM | POA: Diagnosis not present

## 2018-05-02 DIAGNOSIS — E662 Morbid (severe) obesity with alveolar hypoventilation: Secondary | ICD-10-CM | POA: Diagnosis not present

## 2018-05-02 DIAGNOSIS — J45909 Unspecified asthma, uncomplicated: Secondary | ICD-10-CM | POA: Diagnosis not present

## 2018-05-02 DIAGNOSIS — I10 Essential (primary) hypertension: Secondary | ICD-10-CM | POA: Diagnosis not present

## 2018-05-03 DIAGNOSIS — J45909 Unspecified asthma, uncomplicated: Secondary | ICD-10-CM | POA: Diagnosis not present

## 2018-05-03 DIAGNOSIS — E662 Morbid (severe) obesity with alveolar hypoventilation: Secondary | ICD-10-CM | POA: Diagnosis not present

## 2018-05-03 DIAGNOSIS — I10 Essential (primary) hypertension: Secondary | ICD-10-CM | POA: Diagnosis not present

## 2018-05-04 DIAGNOSIS — J45909 Unspecified asthma, uncomplicated: Secondary | ICD-10-CM | POA: Diagnosis not present

## 2018-05-04 DIAGNOSIS — I10 Essential (primary) hypertension: Secondary | ICD-10-CM | POA: Diagnosis not present

## 2018-05-04 DIAGNOSIS — E662 Morbid (severe) obesity with alveolar hypoventilation: Secondary | ICD-10-CM | POA: Diagnosis not present

## 2018-05-05 DIAGNOSIS — E662 Morbid (severe) obesity with alveolar hypoventilation: Secondary | ICD-10-CM | POA: Diagnosis not present

## 2018-05-05 DIAGNOSIS — J45909 Unspecified asthma, uncomplicated: Secondary | ICD-10-CM | POA: Diagnosis not present

## 2018-05-05 DIAGNOSIS — I10 Essential (primary) hypertension: Secondary | ICD-10-CM | POA: Diagnosis not present

## 2018-05-06 DIAGNOSIS — E662 Morbid (severe) obesity with alveolar hypoventilation: Secondary | ICD-10-CM | POA: Diagnosis not present

## 2018-05-06 DIAGNOSIS — I10 Essential (primary) hypertension: Secondary | ICD-10-CM | POA: Diagnosis not present

## 2018-05-06 DIAGNOSIS — J45909 Unspecified asthma, uncomplicated: Secondary | ICD-10-CM | POA: Diagnosis not present

## 2018-05-07 DIAGNOSIS — J45909 Unspecified asthma, uncomplicated: Secondary | ICD-10-CM | POA: Diagnosis not present

## 2018-05-07 DIAGNOSIS — E662 Morbid (severe) obesity with alveolar hypoventilation: Secondary | ICD-10-CM | POA: Diagnosis not present

## 2018-05-07 DIAGNOSIS — I10 Essential (primary) hypertension: Secondary | ICD-10-CM | POA: Diagnosis not present

## 2018-05-08 DIAGNOSIS — J45909 Unspecified asthma, uncomplicated: Secondary | ICD-10-CM | POA: Diagnosis not present

## 2018-05-08 DIAGNOSIS — E662 Morbid (severe) obesity with alveolar hypoventilation: Secondary | ICD-10-CM | POA: Diagnosis not present

## 2018-05-08 DIAGNOSIS — I10 Essential (primary) hypertension: Secondary | ICD-10-CM | POA: Diagnosis not present

## 2018-05-09 DIAGNOSIS — I10 Essential (primary) hypertension: Secondary | ICD-10-CM | POA: Diagnosis not present

## 2018-05-09 DIAGNOSIS — E662 Morbid (severe) obesity with alveolar hypoventilation: Secondary | ICD-10-CM | POA: Diagnosis not present

## 2018-05-09 DIAGNOSIS — J45909 Unspecified asthma, uncomplicated: Secondary | ICD-10-CM | POA: Diagnosis not present

## 2018-05-10 DIAGNOSIS — J45909 Unspecified asthma, uncomplicated: Secondary | ICD-10-CM | POA: Diagnosis not present

## 2018-05-10 DIAGNOSIS — I10 Essential (primary) hypertension: Secondary | ICD-10-CM | POA: Diagnosis not present

## 2018-05-10 DIAGNOSIS — E662 Morbid (severe) obesity with alveolar hypoventilation: Secondary | ICD-10-CM | POA: Diagnosis not present

## 2018-05-11 DIAGNOSIS — E662 Morbid (severe) obesity with alveolar hypoventilation: Secondary | ICD-10-CM | POA: Diagnosis not present

## 2018-05-11 DIAGNOSIS — J45909 Unspecified asthma, uncomplicated: Secondary | ICD-10-CM | POA: Diagnosis not present

## 2018-05-11 DIAGNOSIS — I10 Essential (primary) hypertension: Secondary | ICD-10-CM | POA: Diagnosis not present

## 2018-05-12 DIAGNOSIS — E662 Morbid (severe) obesity with alveolar hypoventilation: Secondary | ICD-10-CM | POA: Diagnosis not present

## 2018-05-12 DIAGNOSIS — J45909 Unspecified asthma, uncomplicated: Secondary | ICD-10-CM | POA: Diagnosis not present

## 2018-05-12 DIAGNOSIS — I10 Essential (primary) hypertension: Secondary | ICD-10-CM | POA: Diagnosis not present

## 2018-05-13 DIAGNOSIS — I10 Essential (primary) hypertension: Secondary | ICD-10-CM | POA: Diagnosis not present

## 2018-05-13 DIAGNOSIS — E662 Morbid (severe) obesity with alveolar hypoventilation: Secondary | ICD-10-CM | POA: Diagnosis not present

## 2018-05-13 DIAGNOSIS — J45909 Unspecified asthma, uncomplicated: Secondary | ICD-10-CM | POA: Diagnosis not present

## 2018-05-14 DIAGNOSIS — J45909 Unspecified asthma, uncomplicated: Secondary | ICD-10-CM | POA: Diagnosis not present

## 2018-05-14 DIAGNOSIS — I10 Essential (primary) hypertension: Secondary | ICD-10-CM | POA: Diagnosis not present

## 2018-05-14 DIAGNOSIS — E662 Morbid (severe) obesity with alveolar hypoventilation: Secondary | ICD-10-CM | POA: Diagnosis not present

## 2018-05-15 DIAGNOSIS — E662 Morbid (severe) obesity with alveolar hypoventilation: Secondary | ICD-10-CM | POA: Diagnosis not present

## 2018-05-15 DIAGNOSIS — J45909 Unspecified asthma, uncomplicated: Secondary | ICD-10-CM | POA: Diagnosis not present

## 2018-05-15 DIAGNOSIS — I10 Essential (primary) hypertension: Secondary | ICD-10-CM | POA: Diagnosis not present

## 2018-05-16 DIAGNOSIS — J45909 Unspecified asthma, uncomplicated: Secondary | ICD-10-CM | POA: Diagnosis not present

## 2018-05-16 DIAGNOSIS — I10 Essential (primary) hypertension: Secondary | ICD-10-CM | POA: Diagnosis not present

## 2018-05-16 DIAGNOSIS — E662 Morbid (severe) obesity with alveolar hypoventilation: Secondary | ICD-10-CM | POA: Diagnosis not present

## 2018-05-17 DIAGNOSIS — I10 Essential (primary) hypertension: Secondary | ICD-10-CM | POA: Diagnosis not present

## 2018-05-17 DIAGNOSIS — J45909 Unspecified asthma, uncomplicated: Secondary | ICD-10-CM | POA: Diagnosis not present

## 2018-05-17 DIAGNOSIS — E662 Morbid (severe) obesity with alveolar hypoventilation: Secondary | ICD-10-CM | POA: Diagnosis not present

## 2018-05-18 DIAGNOSIS — E662 Morbid (severe) obesity with alveolar hypoventilation: Secondary | ICD-10-CM | POA: Diagnosis not present

## 2018-05-18 DIAGNOSIS — J45909 Unspecified asthma, uncomplicated: Secondary | ICD-10-CM | POA: Diagnosis not present

## 2018-05-18 DIAGNOSIS — I10 Essential (primary) hypertension: Secondary | ICD-10-CM | POA: Diagnosis not present

## 2018-05-19 DIAGNOSIS — J45909 Unspecified asthma, uncomplicated: Secondary | ICD-10-CM | POA: Diagnosis not present

## 2018-05-19 DIAGNOSIS — I10 Essential (primary) hypertension: Secondary | ICD-10-CM | POA: Diagnosis not present

## 2018-05-19 DIAGNOSIS — E662 Morbid (severe) obesity with alveolar hypoventilation: Secondary | ICD-10-CM | POA: Diagnosis not present

## 2018-05-20 DIAGNOSIS — J45909 Unspecified asthma, uncomplicated: Secondary | ICD-10-CM | POA: Diagnosis not present

## 2018-05-20 DIAGNOSIS — E662 Morbid (severe) obesity with alveolar hypoventilation: Secondary | ICD-10-CM | POA: Diagnosis not present

## 2018-05-20 DIAGNOSIS — I10 Essential (primary) hypertension: Secondary | ICD-10-CM | POA: Diagnosis not present

## 2018-05-21 DIAGNOSIS — E662 Morbid (severe) obesity with alveolar hypoventilation: Secondary | ICD-10-CM | POA: Diagnosis not present

## 2018-05-21 DIAGNOSIS — I10 Essential (primary) hypertension: Secondary | ICD-10-CM | POA: Diagnosis not present

## 2018-05-21 DIAGNOSIS — J45909 Unspecified asthma, uncomplicated: Secondary | ICD-10-CM | POA: Diagnosis not present

## 2018-05-22 DIAGNOSIS — J45909 Unspecified asthma, uncomplicated: Secondary | ICD-10-CM | POA: Diagnosis not present

## 2018-05-22 DIAGNOSIS — E662 Morbid (severe) obesity with alveolar hypoventilation: Secondary | ICD-10-CM | POA: Diagnosis not present

## 2018-05-22 DIAGNOSIS — I10 Essential (primary) hypertension: Secondary | ICD-10-CM | POA: Diagnosis not present

## 2018-05-23 DIAGNOSIS — E662 Morbid (severe) obesity with alveolar hypoventilation: Secondary | ICD-10-CM | POA: Diagnosis not present

## 2018-05-23 DIAGNOSIS — I10 Essential (primary) hypertension: Secondary | ICD-10-CM | POA: Diagnosis not present

## 2018-05-23 DIAGNOSIS — J45909 Unspecified asthma, uncomplicated: Secondary | ICD-10-CM | POA: Diagnosis not present

## 2018-05-24 DIAGNOSIS — I10 Essential (primary) hypertension: Secondary | ICD-10-CM | POA: Diagnosis not present

## 2018-05-24 DIAGNOSIS — E662 Morbid (severe) obesity with alveolar hypoventilation: Secondary | ICD-10-CM | POA: Diagnosis not present

## 2018-05-24 DIAGNOSIS — J45909 Unspecified asthma, uncomplicated: Secondary | ICD-10-CM | POA: Diagnosis not present

## 2018-05-25 DIAGNOSIS — I10 Essential (primary) hypertension: Secondary | ICD-10-CM | POA: Diagnosis not present

## 2018-05-25 DIAGNOSIS — E662 Morbid (severe) obesity with alveolar hypoventilation: Secondary | ICD-10-CM | POA: Diagnosis not present

## 2018-05-25 DIAGNOSIS — J45909 Unspecified asthma, uncomplicated: Secondary | ICD-10-CM | POA: Diagnosis not present

## 2018-05-26 DIAGNOSIS — J45909 Unspecified asthma, uncomplicated: Secondary | ICD-10-CM | POA: Diagnosis not present

## 2018-05-26 DIAGNOSIS — E662 Morbid (severe) obesity with alveolar hypoventilation: Secondary | ICD-10-CM | POA: Diagnosis not present

## 2018-05-26 DIAGNOSIS — I10 Essential (primary) hypertension: Secondary | ICD-10-CM | POA: Diagnosis not present

## 2018-05-27 DIAGNOSIS — E662 Morbid (severe) obesity with alveolar hypoventilation: Secondary | ICD-10-CM | POA: Diagnosis not present

## 2018-05-27 DIAGNOSIS — J45909 Unspecified asthma, uncomplicated: Secondary | ICD-10-CM | POA: Diagnosis not present

## 2018-05-27 DIAGNOSIS — I10 Essential (primary) hypertension: Secondary | ICD-10-CM | POA: Diagnosis not present

## 2018-05-28 DIAGNOSIS — I10 Essential (primary) hypertension: Secondary | ICD-10-CM | POA: Diagnosis not present

## 2018-05-28 DIAGNOSIS — J45909 Unspecified asthma, uncomplicated: Secondary | ICD-10-CM | POA: Diagnosis not present

## 2018-05-28 DIAGNOSIS — E662 Morbid (severe) obesity with alveolar hypoventilation: Secondary | ICD-10-CM | POA: Diagnosis not present

## 2018-05-29 DIAGNOSIS — E662 Morbid (severe) obesity with alveolar hypoventilation: Secondary | ICD-10-CM | POA: Diagnosis not present

## 2018-05-29 DIAGNOSIS — J45909 Unspecified asthma, uncomplicated: Secondary | ICD-10-CM | POA: Diagnosis not present

## 2018-05-29 DIAGNOSIS — I10 Essential (primary) hypertension: Secondary | ICD-10-CM | POA: Diagnosis not present

## 2018-05-30 DIAGNOSIS — J45909 Unspecified asthma, uncomplicated: Secondary | ICD-10-CM | POA: Diagnosis not present

## 2018-05-30 DIAGNOSIS — E662 Morbid (severe) obesity with alveolar hypoventilation: Secondary | ICD-10-CM | POA: Diagnosis not present

## 2018-05-30 DIAGNOSIS — I10 Essential (primary) hypertension: Secondary | ICD-10-CM | POA: Diagnosis not present

## 2018-05-31 DIAGNOSIS — E662 Morbid (severe) obesity with alveolar hypoventilation: Secondary | ICD-10-CM | POA: Diagnosis not present

## 2018-05-31 DIAGNOSIS — J45909 Unspecified asthma, uncomplicated: Secondary | ICD-10-CM | POA: Diagnosis not present

## 2018-05-31 DIAGNOSIS — I10 Essential (primary) hypertension: Secondary | ICD-10-CM | POA: Diagnosis not present

## 2018-06-01 DIAGNOSIS — J45909 Unspecified asthma, uncomplicated: Secondary | ICD-10-CM | POA: Diagnosis not present

## 2018-06-01 DIAGNOSIS — E662 Morbid (severe) obesity with alveolar hypoventilation: Secondary | ICD-10-CM | POA: Diagnosis not present

## 2018-06-01 DIAGNOSIS — I10 Essential (primary) hypertension: Secondary | ICD-10-CM | POA: Diagnosis not present

## 2018-06-02 DIAGNOSIS — J45909 Unspecified asthma, uncomplicated: Secondary | ICD-10-CM | POA: Diagnosis not present

## 2018-06-02 DIAGNOSIS — E662 Morbid (severe) obesity with alveolar hypoventilation: Secondary | ICD-10-CM | POA: Diagnosis not present

## 2018-06-02 DIAGNOSIS — I10 Essential (primary) hypertension: Secondary | ICD-10-CM | POA: Diagnosis not present

## 2018-06-03 DIAGNOSIS — E662 Morbid (severe) obesity with alveolar hypoventilation: Secondary | ICD-10-CM | POA: Diagnosis not present

## 2018-06-03 DIAGNOSIS — J45909 Unspecified asthma, uncomplicated: Secondary | ICD-10-CM | POA: Diagnosis not present

## 2018-06-03 DIAGNOSIS — I10 Essential (primary) hypertension: Secondary | ICD-10-CM | POA: Diagnosis not present

## 2018-06-04 DIAGNOSIS — J45909 Unspecified asthma, uncomplicated: Secondary | ICD-10-CM | POA: Diagnosis not present

## 2018-06-04 DIAGNOSIS — E662 Morbid (severe) obesity with alveolar hypoventilation: Secondary | ICD-10-CM | POA: Diagnosis not present

## 2018-06-04 DIAGNOSIS — I10 Essential (primary) hypertension: Secondary | ICD-10-CM | POA: Diagnosis not present

## 2018-06-05 DIAGNOSIS — J45909 Unspecified asthma, uncomplicated: Secondary | ICD-10-CM | POA: Diagnosis not present

## 2018-06-05 DIAGNOSIS — I10 Essential (primary) hypertension: Secondary | ICD-10-CM | POA: Diagnosis not present

## 2018-06-05 DIAGNOSIS — E662 Morbid (severe) obesity with alveolar hypoventilation: Secondary | ICD-10-CM | POA: Diagnosis not present

## 2018-06-06 DIAGNOSIS — J45909 Unspecified asthma, uncomplicated: Secondary | ICD-10-CM | POA: Diagnosis not present

## 2018-06-06 DIAGNOSIS — E662 Morbid (severe) obesity with alveolar hypoventilation: Secondary | ICD-10-CM | POA: Diagnosis not present

## 2018-06-06 DIAGNOSIS — I10 Essential (primary) hypertension: Secondary | ICD-10-CM | POA: Diagnosis not present

## 2018-06-07 DIAGNOSIS — E662 Morbid (severe) obesity with alveolar hypoventilation: Secondary | ICD-10-CM | POA: Diagnosis not present

## 2018-06-07 DIAGNOSIS — J45909 Unspecified asthma, uncomplicated: Secondary | ICD-10-CM | POA: Diagnosis not present

## 2018-06-07 DIAGNOSIS — I10 Essential (primary) hypertension: Secondary | ICD-10-CM | POA: Diagnosis not present

## 2018-06-08 DIAGNOSIS — I10 Essential (primary) hypertension: Secondary | ICD-10-CM | POA: Diagnosis not present

## 2018-06-08 DIAGNOSIS — J45909 Unspecified asthma, uncomplicated: Secondary | ICD-10-CM | POA: Diagnosis not present

## 2018-06-08 DIAGNOSIS — E662 Morbid (severe) obesity with alveolar hypoventilation: Secondary | ICD-10-CM | POA: Diagnosis not present

## 2018-06-09 DIAGNOSIS — E662 Morbid (severe) obesity with alveolar hypoventilation: Secondary | ICD-10-CM | POA: Diagnosis not present

## 2018-06-09 DIAGNOSIS — I10 Essential (primary) hypertension: Secondary | ICD-10-CM | POA: Diagnosis not present

## 2018-06-09 DIAGNOSIS — J45909 Unspecified asthma, uncomplicated: Secondary | ICD-10-CM | POA: Diagnosis not present

## 2018-06-10 DIAGNOSIS — I10 Essential (primary) hypertension: Secondary | ICD-10-CM | POA: Diagnosis not present

## 2018-06-10 DIAGNOSIS — E662 Morbid (severe) obesity with alveolar hypoventilation: Secondary | ICD-10-CM | POA: Diagnosis not present

## 2018-06-10 DIAGNOSIS — J45909 Unspecified asthma, uncomplicated: Secondary | ICD-10-CM | POA: Diagnosis not present

## 2018-06-11 DIAGNOSIS — I10 Essential (primary) hypertension: Secondary | ICD-10-CM | POA: Diagnosis not present

## 2018-06-11 DIAGNOSIS — J45909 Unspecified asthma, uncomplicated: Secondary | ICD-10-CM | POA: Diagnosis not present

## 2018-06-11 DIAGNOSIS — E662 Morbid (severe) obesity with alveolar hypoventilation: Secondary | ICD-10-CM | POA: Diagnosis not present

## 2018-06-12 DIAGNOSIS — J45909 Unspecified asthma, uncomplicated: Secondary | ICD-10-CM | POA: Diagnosis not present

## 2018-06-12 DIAGNOSIS — E662 Morbid (severe) obesity with alveolar hypoventilation: Secondary | ICD-10-CM | POA: Diagnosis not present

## 2018-06-12 DIAGNOSIS — I10 Essential (primary) hypertension: Secondary | ICD-10-CM | POA: Diagnosis not present

## 2018-06-13 DIAGNOSIS — E662 Morbid (severe) obesity with alveolar hypoventilation: Secondary | ICD-10-CM | POA: Diagnosis not present

## 2018-06-13 DIAGNOSIS — I10 Essential (primary) hypertension: Secondary | ICD-10-CM | POA: Diagnosis not present

## 2018-06-13 DIAGNOSIS — J45909 Unspecified asthma, uncomplicated: Secondary | ICD-10-CM | POA: Diagnosis not present

## 2018-06-14 DIAGNOSIS — E662 Morbid (severe) obesity with alveolar hypoventilation: Secondary | ICD-10-CM | POA: Diagnosis not present

## 2018-06-14 DIAGNOSIS — I10 Essential (primary) hypertension: Secondary | ICD-10-CM | POA: Diagnosis not present

## 2018-06-14 DIAGNOSIS — J45909 Unspecified asthma, uncomplicated: Secondary | ICD-10-CM | POA: Diagnosis not present

## 2018-06-15 DIAGNOSIS — J45909 Unspecified asthma, uncomplicated: Secondary | ICD-10-CM | POA: Diagnosis not present

## 2018-06-15 DIAGNOSIS — E662 Morbid (severe) obesity with alveolar hypoventilation: Secondary | ICD-10-CM | POA: Diagnosis not present

## 2018-06-15 DIAGNOSIS — I10 Essential (primary) hypertension: Secondary | ICD-10-CM | POA: Diagnosis not present

## 2018-06-16 DIAGNOSIS — E662 Morbid (severe) obesity with alveolar hypoventilation: Secondary | ICD-10-CM | POA: Diagnosis not present

## 2018-06-16 DIAGNOSIS — J45909 Unspecified asthma, uncomplicated: Secondary | ICD-10-CM | POA: Diagnosis not present

## 2018-06-16 DIAGNOSIS — I10 Essential (primary) hypertension: Secondary | ICD-10-CM | POA: Diagnosis not present

## 2018-06-17 DIAGNOSIS — I10 Essential (primary) hypertension: Secondary | ICD-10-CM | POA: Diagnosis not present

## 2018-06-17 DIAGNOSIS — J45909 Unspecified asthma, uncomplicated: Secondary | ICD-10-CM | POA: Diagnosis not present

## 2018-06-17 DIAGNOSIS — E662 Morbid (severe) obesity with alveolar hypoventilation: Secondary | ICD-10-CM | POA: Diagnosis not present

## 2018-06-18 DIAGNOSIS — E662 Morbid (severe) obesity with alveolar hypoventilation: Secondary | ICD-10-CM | POA: Diagnosis not present

## 2018-06-18 DIAGNOSIS — J45909 Unspecified asthma, uncomplicated: Secondary | ICD-10-CM | POA: Diagnosis not present

## 2018-06-18 DIAGNOSIS — I10 Essential (primary) hypertension: Secondary | ICD-10-CM | POA: Diagnosis not present

## 2018-06-19 DIAGNOSIS — J45909 Unspecified asthma, uncomplicated: Secondary | ICD-10-CM | POA: Diagnosis not present

## 2018-06-19 DIAGNOSIS — I10 Essential (primary) hypertension: Secondary | ICD-10-CM | POA: Diagnosis not present

## 2018-06-19 DIAGNOSIS — E662 Morbid (severe) obesity with alveolar hypoventilation: Secondary | ICD-10-CM | POA: Diagnosis not present

## 2018-06-20 DIAGNOSIS — J45909 Unspecified asthma, uncomplicated: Secondary | ICD-10-CM | POA: Diagnosis not present

## 2018-06-20 DIAGNOSIS — I10 Essential (primary) hypertension: Secondary | ICD-10-CM | POA: Diagnosis not present

## 2018-06-20 DIAGNOSIS — E662 Morbid (severe) obesity with alveolar hypoventilation: Secondary | ICD-10-CM | POA: Diagnosis not present

## 2018-06-21 DIAGNOSIS — I10 Essential (primary) hypertension: Secondary | ICD-10-CM | POA: Diagnosis not present

## 2018-06-21 DIAGNOSIS — J45909 Unspecified asthma, uncomplicated: Secondary | ICD-10-CM | POA: Diagnosis not present

## 2018-06-21 DIAGNOSIS — E662 Morbid (severe) obesity with alveolar hypoventilation: Secondary | ICD-10-CM | POA: Diagnosis not present

## 2018-06-22 DIAGNOSIS — J45909 Unspecified asthma, uncomplicated: Secondary | ICD-10-CM | POA: Diagnosis not present

## 2018-06-22 DIAGNOSIS — E662 Morbid (severe) obesity with alveolar hypoventilation: Secondary | ICD-10-CM | POA: Diagnosis not present

## 2018-06-22 DIAGNOSIS — I10 Essential (primary) hypertension: Secondary | ICD-10-CM | POA: Diagnosis not present

## 2018-06-23 DIAGNOSIS — I10 Essential (primary) hypertension: Secondary | ICD-10-CM | POA: Diagnosis not present

## 2018-06-23 DIAGNOSIS — J45909 Unspecified asthma, uncomplicated: Secondary | ICD-10-CM | POA: Diagnosis not present

## 2018-06-23 DIAGNOSIS — E662 Morbid (severe) obesity with alveolar hypoventilation: Secondary | ICD-10-CM | POA: Diagnosis not present

## 2018-06-24 DIAGNOSIS — J45909 Unspecified asthma, uncomplicated: Secondary | ICD-10-CM | POA: Diagnosis not present

## 2018-06-24 DIAGNOSIS — I10 Essential (primary) hypertension: Secondary | ICD-10-CM | POA: Diagnosis not present

## 2018-06-24 DIAGNOSIS — E662 Morbid (severe) obesity with alveolar hypoventilation: Secondary | ICD-10-CM | POA: Diagnosis not present

## 2018-06-25 DIAGNOSIS — I10 Essential (primary) hypertension: Secondary | ICD-10-CM | POA: Diagnosis not present

## 2018-06-25 DIAGNOSIS — J45909 Unspecified asthma, uncomplicated: Secondary | ICD-10-CM | POA: Diagnosis not present

## 2018-06-25 DIAGNOSIS — E662 Morbid (severe) obesity with alveolar hypoventilation: Secondary | ICD-10-CM | POA: Diagnosis not present

## 2018-06-26 DIAGNOSIS — J45909 Unspecified asthma, uncomplicated: Secondary | ICD-10-CM | POA: Diagnosis not present

## 2018-06-26 DIAGNOSIS — I10 Essential (primary) hypertension: Secondary | ICD-10-CM | POA: Diagnosis not present

## 2018-06-26 DIAGNOSIS — E662 Morbid (severe) obesity with alveolar hypoventilation: Secondary | ICD-10-CM | POA: Diagnosis not present

## 2018-06-27 DIAGNOSIS — E662 Morbid (severe) obesity with alveolar hypoventilation: Secondary | ICD-10-CM | POA: Diagnosis not present

## 2018-06-27 DIAGNOSIS — J45909 Unspecified asthma, uncomplicated: Secondary | ICD-10-CM | POA: Diagnosis not present

## 2018-06-27 DIAGNOSIS — I10 Essential (primary) hypertension: Secondary | ICD-10-CM | POA: Diagnosis not present

## 2018-06-28 DIAGNOSIS — J45909 Unspecified asthma, uncomplicated: Secondary | ICD-10-CM | POA: Diagnosis not present

## 2018-06-28 DIAGNOSIS — E662 Morbid (severe) obesity with alveolar hypoventilation: Secondary | ICD-10-CM | POA: Diagnosis not present

## 2018-06-28 DIAGNOSIS — I10 Essential (primary) hypertension: Secondary | ICD-10-CM | POA: Diagnosis not present

## 2018-06-29 DIAGNOSIS — I10 Essential (primary) hypertension: Secondary | ICD-10-CM | POA: Diagnosis not present

## 2018-06-29 DIAGNOSIS — J45909 Unspecified asthma, uncomplicated: Secondary | ICD-10-CM | POA: Diagnosis not present

## 2018-06-29 DIAGNOSIS — E662 Morbid (severe) obesity with alveolar hypoventilation: Secondary | ICD-10-CM | POA: Diagnosis not present

## 2018-06-30 DIAGNOSIS — J45909 Unspecified asthma, uncomplicated: Secondary | ICD-10-CM | POA: Diagnosis not present

## 2018-06-30 DIAGNOSIS — I10 Essential (primary) hypertension: Secondary | ICD-10-CM | POA: Diagnosis not present

## 2018-06-30 DIAGNOSIS — E662 Morbid (severe) obesity with alveolar hypoventilation: Secondary | ICD-10-CM | POA: Diagnosis not present

## 2018-07-01 DIAGNOSIS — J45909 Unspecified asthma, uncomplicated: Secondary | ICD-10-CM | POA: Diagnosis not present

## 2018-07-01 DIAGNOSIS — I10 Essential (primary) hypertension: Secondary | ICD-10-CM | POA: Diagnosis not present

## 2018-07-01 DIAGNOSIS — E662 Morbid (severe) obesity with alveolar hypoventilation: Secondary | ICD-10-CM | POA: Diagnosis not present

## 2018-07-02 DIAGNOSIS — J45909 Unspecified asthma, uncomplicated: Secondary | ICD-10-CM | POA: Diagnosis not present

## 2018-07-02 DIAGNOSIS — I10 Essential (primary) hypertension: Secondary | ICD-10-CM | POA: Diagnosis not present

## 2018-07-02 DIAGNOSIS — E662 Morbid (severe) obesity with alveolar hypoventilation: Secondary | ICD-10-CM | POA: Diagnosis not present

## 2018-07-03 DIAGNOSIS — E662 Morbid (severe) obesity with alveolar hypoventilation: Secondary | ICD-10-CM | POA: Diagnosis not present

## 2018-07-03 DIAGNOSIS — I10 Essential (primary) hypertension: Secondary | ICD-10-CM | POA: Diagnosis not present

## 2018-07-03 DIAGNOSIS — J45909 Unspecified asthma, uncomplicated: Secondary | ICD-10-CM | POA: Diagnosis not present

## 2018-07-04 DIAGNOSIS — J45909 Unspecified asthma, uncomplicated: Secondary | ICD-10-CM | POA: Diagnosis not present

## 2018-07-04 DIAGNOSIS — E662 Morbid (severe) obesity with alveolar hypoventilation: Secondary | ICD-10-CM | POA: Diagnosis not present

## 2018-07-04 DIAGNOSIS — I10 Essential (primary) hypertension: Secondary | ICD-10-CM | POA: Diagnosis not present

## 2018-07-05 DIAGNOSIS — J45909 Unspecified asthma, uncomplicated: Secondary | ICD-10-CM | POA: Diagnosis not present

## 2018-07-05 DIAGNOSIS — E662 Morbid (severe) obesity with alveolar hypoventilation: Secondary | ICD-10-CM | POA: Diagnosis not present

## 2018-07-05 DIAGNOSIS — I10 Essential (primary) hypertension: Secondary | ICD-10-CM | POA: Diagnosis not present

## 2018-07-06 DIAGNOSIS — E662 Morbid (severe) obesity with alveolar hypoventilation: Secondary | ICD-10-CM | POA: Diagnosis not present

## 2018-07-06 DIAGNOSIS — I10 Essential (primary) hypertension: Secondary | ICD-10-CM | POA: Diagnosis not present

## 2018-07-06 DIAGNOSIS — J45909 Unspecified asthma, uncomplicated: Secondary | ICD-10-CM | POA: Diagnosis not present

## 2018-07-07 DIAGNOSIS — J45909 Unspecified asthma, uncomplicated: Secondary | ICD-10-CM | POA: Diagnosis not present

## 2018-07-07 DIAGNOSIS — I10 Essential (primary) hypertension: Secondary | ICD-10-CM | POA: Diagnosis not present

## 2018-07-07 DIAGNOSIS — E662 Morbid (severe) obesity with alveolar hypoventilation: Secondary | ICD-10-CM | POA: Diagnosis not present

## 2018-07-08 DIAGNOSIS — J45909 Unspecified asthma, uncomplicated: Secondary | ICD-10-CM | POA: Diagnosis not present

## 2018-07-08 DIAGNOSIS — I10 Essential (primary) hypertension: Secondary | ICD-10-CM | POA: Diagnosis not present

## 2018-07-08 DIAGNOSIS — E662 Morbid (severe) obesity with alveolar hypoventilation: Secondary | ICD-10-CM | POA: Diagnosis not present

## 2018-07-09 DIAGNOSIS — E662 Morbid (severe) obesity with alveolar hypoventilation: Secondary | ICD-10-CM | POA: Diagnosis not present

## 2018-07-09 DIAGNOSIS — J45909 Unspecified asthma, uncomplicated: Secondary | ICD-10-CM | POA: Diagnosis not present

## 2018-07-09 DIAGNOSIS — I10 Essential (primary) hypertension: Secondary | ICD-10-CM | POA: Diagnosis not present

## 2018-07-10 DIAGNOSIS — I10 Essential (primary) hypertension: Secondary | ICD-10-CM | POA: Diagnosis not present

## 2018-07-10 DIAGNOSIS — E662 Morbid (severe) obesity with alveolar hypoventilation: Secondary | ICD-10-CM | POA: Diagnosis not present

## 2018-07-10 DIAGNOSIS — J45909 Unspecified asthma, uncomplicated: Secondary | ICD-10-CM | POA: Diagnosis not present

## 2018-07-11 DIAGNOSIS — E662 Morbid (severe) obesity with alveolar hypoventilation: Secondary | ICD-10-CM | POA: Diagnosis not present

## 2018-07-11 DIAGNOSIS — J45909 Unspecified asthma, uncomplicated: Secondary | ICD-10-CM | POA: Diagnosis not present

## 2018-07-11 DIAGNOSIS — I10 Essential (primary) hypertension: Secondary | ICD-10-CM | POA: Diagnosis not present

## 2018-07-12 DIAGNOSIS — J45909 Unspecified asthma, uncomplicated: Secondary | ICD-10-CM | POA: Diagnosis not present

## 2018-07-12 DIAGNOSIS — I10 Essential (primary) hypertension: Secondary | ICD-10-CM | POA: Diagnosis not present

## 2018-07-12 DIAGNOSIS — E662 Morbid (severe) obesity with alveolar hypoventilation: Secondary | ICD-10-CM | POA: Diagnosis not present

## 2018-07-13 DIAGNOSIS — J45909 Unspecified asthma, uncomplicated: Secondary | ICD-10-CM | POA: Diagnosis not present

## 2018-07-13 DIAGNOSIS — E662 Morbid (severe) obesity with alveolar hypoventilation: Secondary | ICD-10-CM | POA: Diagnosis not present

## 2018-07-13 DIAGNOSIS — I10 Essential (primary) hypertension: Secondary | ICD-10-CM | POA: Diagnosis not present

## 2018-07-14 DIAGNOSIS — E662 Morbid (severe) obesity with alveolar hypoventilation: Secondary | ICD-10-CM | POA: Diagnosis not present

## 2018-07-14 DIAGNOSIS — I10 Essential (primary) hypertension: Secondary | ICD-10-CM | POA: Diagnosis not present

## 2018-07-14 DIAGNOSIS — J45909 Unspecified asthma, uncomplicated: Secondary | ICD-10-CM | POA: Diagnosis not present

## 2018-07-15 DIAGNOSIS — J45909 Unspecified asthma, uncomplicated: Secondary | ICD-10-CM | POA: Diagnosis not present

## 2018-07-15 DIAGNOSIS — E662 Morbid (severe) obesity with alveolar hypoventilation: Secondary | ICD-10-CM | POA: Diagnosis not present

## 2018-07-15 DIAGNOSIS — I10 Essential (primary) hypertension: Secondary | ICD-10-CM | POA: Diagnosis not present

## 2018-07-16 DIAGNOSIS — J45909 Unspecified asthma, uncomplicated: Secondary | ICD-10-CM | POA: Diagnosis not present

## 2018-07-16 DIAGNOSIS — E662 Morbid (severe) obesity with alveolar hypoventilation: Secondary | ICD-10-CM | POA: Diagnosis not present

## 2018-07-16 DIAGNOSIS — I10 Essential (primary) hypertension: Secondary | ICD-10-CM | POA: Diagnosis not present

## 2018-07-17 DIAGNOSIS — E662 Morbid (severe) obesity with alveolar hypoventilation: Secondary | ICD-10-CM | POA: Diagnosis not present

## 2018-07-17 DIAGNOSIS — I10 Essential (primary) hypertension: Secondary | ICD-10-CM | POA: Diagnosis not present

## 2018-07-17 DIAGNOSIS — J45909 Unspecified asthma, uncomplicated: Secondary | ICD-10-CM | POA: Diagnosis not present

## 2018-07-18 DIAGNOSIS — J45909 Unspecified asthma, uncomplicated: Secondary | ICD-10-CM | POA: Diagnosis not present

## 2018-07-18 DIAGNOSIS — I10 Essential (primary) hypertension: Secondary | ICD-10-CM | POA: Diagnosis not present

## 2018-07-18 DIAGNOSIS — E662 Morbid (severe) obesity with alveolar hypoventilation: Secondary | ICD-10-CM | POA: Diagnosis not present

## 2018-07-19 DIAGNOSIS — I10 Essential (primary) hypertension: Secondary | ICD-10-CM | POA: Diagnosis not present

## 2018-07-19 DIAGNOSIS — E662 Morbid (severe) obesity with alveolar hypoventilation: Secondary | ICD-10-CM | POA: Diagnosis not present

## 2018-07-19 DIAGNOSIS — J45909 Unspecified asthma, uncomplicated: Secondary | ICD-10-CM | POA: Diagnosis not present

## 2018-07-20 DIAGNOSIS — I10 Essential (primary) hypertension: Secondary | ICD-10-CM | POA: Diagnosis not present

## 2018-07-20 DIAGNOSIS — J45909 Unspecified asthma, uncomplicated: Secondary | ICD-10-CM | POA: Diagnosis not present

## 2018-07-20 DIAGNOSIS — E662 Morbid (severe) obesity with alveolar hypoventilation: Secondary | ICD-10-CM | POA: Diagnosis not present

## 2018-07-21 DIAGNOSIS — J45909 Unspecified asthma, uncomplicated: Secondary | ICD-10-CM | POA: Diagnosis not present

## 2018-07-21 DIAGNOSIS — I10 Essential (primary) hypertension: Secondary | ICD-10-CM | POA: Diagnosis not present

## 2018-07-21 DIAGNOSIS — E662 Morbid (severe) obesity with alveolar hypoventilation: Secondary | ICD-10-CM | POA: Diagnosis not present

## 2018-07-22 DIAGNOSIS — E662 Morbid (severe) obesity with alveolar hypoventilation: Secondary | ICD-10-CM | POA: Diagnosis not present

## 2018-07-22 DIAGNOSIS — I10 Essential (primary) hypertension: Secondary | ICD-10-CM | POA: Diagnosis not present

## 2018-07-22 DIAGNOSIS — J45909 Unspecified asthma, uncomplicated: Secondary | ICD-10-CM | POA: Diagnosis not present

## 2018-07-23 DIAGNOSIS — I10 Essential (primary) hypertension: Secondary | ICD-10-CM | POA: Diagnosis not present

## 2018-07-23 DIAGNOSIS — J45909 Unspecified asthma, uncomplicated: Secondary | ICD-10-CM | POA: Diagnosis not present

## 2018-07-23 DIAGNOSIS — E662 Morbid (severe) obesity with alveolar hypoventilation: Secondary | ICD-10-CM | POA: Diagnosis not present

## 2018-07-24 DIAGNOSIS — J45909 Unspecified asthma, uncomplicated: Secondary | ICD-10-CM | POA: Diagnosis not present

## 2018-07-24 DIAGNOSIS — I10 Essential (primary) hypertension: Secondary | ICD-10-CM | POA: Diagnosis not present

## 2018-07-24 DIAGNOSIS — E662 Morbid (severe) obesity with alveolar hypoventilation: Secondary | ICD-10-CM | POA: Diagnosis not present

## 2018-07-25 DIAGNOSIS — J45909 Unspecified asthma, uncomplicated: Secondary | ICD-10-CM | POA: Diagnosis not present

## 2018-07-25 DIAGNOSIS — E662 Morbid (severe) obesity with alveolar hypoventilation: Secondary | ICD-10-CM | POA: Diagnosis not present

## 2018-07-25 DIAGNOSIS — I10 Essential (primary) hypertension: Secondary | ICD-10-CM | POA: Diagnosis not present

## 2018-07-26 DIAGNOSIS — I10 Essential (primary) hypertension: Secondary | ICD-10-CM | POA: Diagnosis not present

## 2018-07-26 DIAGNOSIS — J45909 Unspecified asthma, uncomplicated: Secondary | ICD-10-CM | POA: Diagnosis not present

## 2018-07-26 DIAGNOSIS — E662 Morbid (severe) obesity with alveolar hypoventilation: Secondary | ICD-10-CM | POA: Diagnosis not present

## 2018-07-27 DIAGNOSIS — E662 Morbid (severe) obesity with alveolar hypoventilation: Secondary | ICD-10-CM | POA: Diagnosis not present

## 2018-07-27 DIAGNOSIS — J45909 Unspecified asthma, uncomplicated: Secondary | ICD-10-CM | POA: Diagnosis not present

## 2018-07-27 DIAGNOSIS — I10 Essential (primary) hypertension: Secondary | ICD-10-CM | POA: Diagnosis not present

## 2018-07-28 DIAGNOSIS — I10 Essential (primary) hypertension: Secondary | ICD-10-CM | POA: Diagnosis not present

## 2018-07-28 DIAGNOSIS — J45909 Unspecified asthma, uncomplicated: Secondary | ICD-10-CM | POA: Diagnosis not present

## 2018-07-28 DIAGNOSIS — E662 Morbid (severe) obesity with alveolar hypoventilation: Secondary | ICD-10-CM | POA: Diagnosis not present

## 2018-07-29 DIAGNOSIS — E662 Morbid (severe) obesity with alveolar hypoventilation: Secondary | ICD-10-CM | POA: Diagnosis not present

## 2018-07-29 DIAGNOSIS — I10 Essential (primary) hypertension: Secondary | ICD-10-CM | POA: Diagnosis not present

## 2018-07-29 DIAGNOSIS — J45909 Unspecified asthma, uncomplicated: Secondary | ICD-10-CM | POA: Diagnosis not present

## 2018-07-30 DIAGNOSIS — I10 Essential (primary) hypertension: Secondary | ICD-10-CM | POA: Diagnosis not present

## 2018-07-30 DIAGNOSIS — E662 Morbid (severe) obesity with alveolar hypoventilation: Secondary | ICD-10-CM | POA: Diagnosis not present

## 2018-07-30 DIAGNOSIS — J45909 Unspecified asthma, uncomplicated: Secondary | ICD-10-CM | POA: Diagnosis not present

## 2018-07-31 DIAGNOSIS — E662 Morbid (severe) obesity with alveolar hypoventilation: Secondary | ICD-10-CM | POA: Diagnosis not present

## 2018-07-31 DIAGNOSIS — J45909 Unspecified asthma, uncomplicated: Secondary | ICD-10-CM | POA: Diagnosis not present

## 2018-07-31 DIAGNOSIS — I10 Essential (primary) hypertension: Secondary | ICD-10-CM | POA: Diagnosis not present

## 2018-08-01 DIAGNOSIS — J45909 Unspecified asthma, uncomplicated: Secondary | ICD-10-CM | POA: Diagnosis not present

## 2018-08-01 DIAGNOSIS — I10 Essential (primary) hypertension: Secondary | ICD-10-CM | POA: Diagnosis not present

## 2018-08-01 DIAGNOSIS — E662 Morbid (severe) obesity with alveolar hypoventilation: Secondary | ICD-10-CM | POA: Diagnosis not present

## 2018-08-02 DIAGNOSIS — J45909 Unspecified asthma, uncomplicated: Secondary | ICD-10-CM | POA: Diagnosis not present

## 2018-08-02 DIAGNOSIS — E662 Morbid (severe) obesity with alveolar hypoventilation: Secondary | ICD-10-CM | POA: Diagnosis not present

## 2018-08-02 DIAGNOSIS — I10 Essential (primary) hypertension: Secondary | ICD-10-CM | POA: Diagnosis not present

## 2018-08-03 DIAGNOSIS — I10 Essential (primary) hypertension: Secondary | ICD-10-CM | POA: Diagnosis not present

## 2018-08-03 DIAGNOSIS — J45909 Unspecified asthma, uncomplicated: Secondary | ICD-10-CM | POA: Diagnosis not present

## 2018-08-03 DIAGNOSIS — E662 Morbid (severe) obesity with alveolar hypoventilation: Secondary | ICD-10-CM | POA: Diagnosis not present

## 2018-08-04 DIAGNOSIS — I10 Essential (primary) hypertension: Secondary | ICD-10-CM | POA: Diagnosis not present

## 2018-08-04 DIAGNOSIS — J45909 Unspecified asthma, uncomplicated: Secondary | ICD-10-CM | POA: Diagnosis not present

## 2018-08-04 DIAGNOSIS — E662 Morbid (severe) obesity with alveolar hypoventilation: Secondary | ICD-10-CM | POA: Diagnosis not present

## 2018-08-05 DIAGNOSIS — J45909 Unspecified asthma, uncomplicated: Secondary | ICD-10-CM | POA: Diagnosis not present

## 2018-08-05 DIAGNOSIS — I10 Essential (primary) hypertension: Secondary | ICD-10-CM | POA: Diagnosis not present

## 2018-08-05 DIAGNOSIS — E662 Morbid (severe) obesity with alveolar hypoventilation: Secondary | ICD-10-CM | POA: Diagnosis not present

## 2018-08-06 DIAGNOSIS — E662 Morbid (severe) obesity with alveolar hypoventilation: Secondary | ICD-10-CM | POA: Diagnosis not present

## 2018-08-06 DIAGNOSIS — I10 Essential (primary) hypertension: Secondary | ICD-10-CM | POA: Diagnosis not present

## 2018-08-06 DIAGNOSIS — J45909 Unspecified asthma, uncomplicated: Secondary | ICD-10-CM | POA: Diagnosis not present

## 2018-08-07 DIAGNOSIS — J45909 Unspecified asthma, uncomplicated: Secondary | ICD-10-CM | POA: Diagnosis not present

## 2018-08-07 DIAGNOSIS — I10 Essential (primary) hypertension: Secondary | ICD-10-CM | POA: Diagnosis not present

## 2018-08-07 DIAGNOSIS — E662 Morbid (severe) obesity with alveolar hypoventilation: Secondary | ICD-10-CM | POA: Diagnosis not present

## 2018-08-08 DIAGNOSIS — I10 Essential (primary) hypertension: Secondary | ICD-10-CM | POA: Diagnosis not present

## 2018-08-08 DIAGNOSIS — E662 Morbid (severe) obesity with alveolar hypoventilation: Secondary | ICD-10-CM | POA: Diagnosis not present

## 2018-08-08 DIAGNOSIS — J45909 Unspecified asthma, uncomplicated: Secondary | ICD-10-CM | POA: Diagnosis not present

## 2018-08-09 DIAGNOSIS — I10 Essential (primary) hypertension: Secondary | ICD-10-CM | POA: Diagnosis not present

## 2018-08-09 DIAGNOSIS — E662 Morbid (severe) obesity with alveolar hypoventilation: Secondary | ICD-10-CM | POA: Diagnosis not present

## 2018-08-09 DIAGNOSIS — J45909 Unspecified asthma, uncomplicated: Secondary | ICD-10-CM | POA: Diagnosis not present

## 2018-08-10 DIAGNOSIS — J45909 Unspecified asthma, uncomplicated: Secondary | ICD-10-CM | POA: Diagnosis not present

## 2018-08-10 DIAGNOSIS — E662 Morbid (severe) obesity with alveolar hypoventilation: Secondary | ICD-10-CM | POA: Diagnosis not present

## 2018-08-10 DIAGNOSIS — I10 Essential (primary) hypertension: Secondary | ICD-10-CM | POA: Diagnosis not present

## 2018-08-11 DIAGNOSIS — E662 Morbid (severe) obesity with alveolar hypoventilation: Secondary | ICD-10-CM | POA: Diagnosis not present

## 2018-08-11 DIAGNOSIS — J45909 Unspecified asthma, uncomplicated: Secondary | ICD-10-CM | POA: Diagnosis not present

## 2018-08-11 DIAGNOSIS — I10 Essential (primary) hypertension: Secondary | ICD-10-CM | POA: Diagnosis not present

## 2018-08-12 DIAGNOSIS — J45909 Unspecified asthma, uncomplicated: Secondary | ICD-10-CM | POA: Diagnosis not present

## 2018-08-12 DIAGNOSIS — E662 Morbid (severe) obesity with alveolar hypoventilation: Secondary | ICD-10-CM | POA: Diagnosis not present

## 2018-08-12 DIAGNOSIS — I10 Essential (primary) hypertension: Secondary | ICD-10-CM | POA: Diagnosis not present

## 2018-08-13 DIAGNOSIS — I10 Essential (primary) hypertension: Secondary | ICD-10-CM | POA: Diagnosis not present

## 2018-08-13 DIAGNOSIS — E662 Morbid (severe) obesity with alveolar hypoventilation: Secondary | ICD-10-CM | POA: Diagnosis not present

## 2018-08-13 DIAGNOSIS — J45909 Unspecified asthma, uncomplicated: Secondary | ICD-10-CM | POA: Diagnosis not present

## 2018-08-14 DIAGNOSIS — J45909 Unspecified asthma, uncomplicated: Secondary | ICD-10-CM | POA: Diagnosis not present

## 2018-08-14 DIAGNOSIS — I10 Essential (primary) hypertension: Secondary | ICD-10-CM | POA: Diagnosis not present

## 2018-08-14 DIAGNOSIS — E662 Morbid (severe) obesity with alveolar hypoventilation: Secondary | ICD-10-CM | POA: Diagnosis not present

## 2018-08-15 DIAGNOSIS — J45909 Unspecified asthma, uncomplicated: Secondary | ICD-10-CM | POA: Diagnosis not present

## 2018-08-15 DIAGNOSIS — E662 Morbid (severe) obesity with alveolar hypoventilation: Secondary | ICD-10-CM | POA: Diagnosis not present

## 2018-08-15 DIAGNOSIS — I10 Essential (primary) hypertension: Secondary | ICD-10-CM | POA: Diagnosis not present

## 2018-08-16 DIAGNOSIS — J45909 Unspecified asthma, uncomplicated: Secondary | ICD-10-CM | POA: Diagnosis not present

## 2018-08-16 DIAGNOSIS — E662 Morbid (severe) obesity with alveolar hypoventilation: Secondary | ICD-10-CM | POA: Diagnosis not present

## 2018-08-16 DIAGNOSIS — I10 Essential (primary) hypertension: Secondary | ICD-10-CM | POA: Diagnosis not present

## 2018-08-17 DIAGNOSIS — I10 Essential (primary) hypertension: Secondary | ICD-10-CM | POA: Diagnosis not present

## 2018-08-17 DIAGNOSIS — J45909 Unspecified asthma, uncomplicated: Secondary | ICD-10-CM | POA: Diagnosis not present

## 2018-08-17 DIAGNOSIS — E662 Morbid (severe) obesity with alveolar hypoventilation: Secondary | ICD-10-CM | POA: Diagnosis not present

## 2018-08-18 DIAGNOSIS — J45909 Unspecified asthma, uncomplicated: Secondary | ICD-10-CM | POA: Diagnosis not present

## 2018-08-18 DIAGNOSIS — I10 Essential (primary) hypertension: Secondary | ICD-10-CM | POA: Diagnosis not present

## 2018-08-18 DIAGNOSIS — E662 Morbid (severe) obesity with alveolar hypoventilation: Secondary | ICD-10-CM | POA: Diagnosis not present

## 2018-08-19 DIAGNOSIS — J45909 Unspecified asthma, uncomplicated: Secondary | ICD-10-CM | POA: Diagnosis not present

## 2018-08-19 DIAGNOSIS — E662 Morbid (severe) obesity with alveolar hypoventilation: Secondary | ICD-10-CM | POA: Diagnosis not present

## 2018-08-19 DIAGNOSIS — I10 Essential (primary) hypertension: Secondary | ICD-10-CM | POA: Diagnosis not present

## 2018-08-20 DIAGNOSIS — J45909 Unspecified asthma, uncomplicated: Secondary | ICD-10-CM | POA: Diagnosis not present

## 2018-08-20 DIAGNOSIS — E662 Morbid (severe) obesity with alveolar hypoventilation: Secondary | ICD-10-CM | POA: Diagnosis not present

## 2018-08-20 DIAGNOSIS — I10 Essential (primary) hypertension: Secondary | ICD-10-CM | POA: Diagnosis not present

## 2018-08-21 DIAGNOSIS — E662 Morbid (severe) obesity with alveolar hypoventilation: Secondary | ICD-10-CM | POA: Diagnosis not present

## 2018-08-21 DIAGNOSIS — J45909 Unspecified asthma, uncomplicated: Secondary | ICD-10-CM | POA: Diagnosis not present

## 2018-08-21 DIAGNOSIS — I10 Essential (primary) hypertension: Secondary | ICD-10-CM | POA: Diagnosis not present

## 2018-08-22 DIAGNOSIS — I10 Essential (primary) hypertension: Secondary | ICD-10-CM | POA: Diagnosis not present

## 2018-08-22 DIAGNOSIS — E662 Morbid (severe) obesity with alveolar hypoventilation: Secondary | ICD-10-CM | POA: Diagnosis not present

## 2018-08-22 DIAGNOSIS — J45909 Unspecified asthma, uncomplicated: Secondary | ICD-10-CM | POA: Diagnosis not present

## 2018-08-23 DIAGNOSIS — J45909 Unspecified asthma, uncomplicated: Secondary | ICD-10-CM | POA: Diagnosis not present

## 2018-08-23 DIAGNOSIS — I10 Essential (primary) hypertension: Secondary | ICD-10-CM | POA: Diagnosis not present

## 2018-08-23 DIAGNOSIS — E662 Morbid (severe) obesity with alveolar hypoventilation: Secondary | ICD-10-CM | POA: Diagnosis not present

## 2018-08-24 DIAGNOSIS — E662 Morbid (severe) obesity with alveolar hypoventilation: Secondary | ICD-10-CM | POA: Diagnosis not present

## 2018-08-24 DIAGNOSIS — I10 Essential (primary) hypertension: Secondary | ICD-10-CM | POA: Diagnosis not present

## 2018-08-24 DIAGNOSIS — J45909 Unspecified asthma, uncomplicated: Secondary | ICD-10-CM | POA: Diagnosis not present

## 2018-08-25 DIAGNOSIS — I10 Essential (primary) hypertension: Secondary | ICD-10-CM | POA: Diagnosis not present

## 2018-08-25 DIAGNOSIS — J45909 Unspecified asthma, uncomplicated: Secondary | ICD-10-CM | POA: Diagnosis not present

## 2018-08-25 DIAGNOSIS — E662 Morbid (severe) obesity with alveolar hypoventilation: Secondary | ICD-10-CM | POA: Diagnosis not present

## 2018-08-26 DIAGNOSIS — I10 Essential (primary) hypertension: Secondary | ICD-10-CM | POA: Diagnosis not present

## 2018-08-26 DIAGNOSIS — J45909 Unspecified asthma, uncomplicated: Secondary | ICD-10-CM | POA: Diagnosis not present

## 2018-08-26 DIAGNOSIS — E662 Morbid (severe) obesity with alveolar hypoventilation: Secondary | ICD-10-CM | POA: Diagnosis not present

## 2018-08-27 DIAGNOSIS — I10 Essential (primary) hypertension: Secondary | ICD-10-CM | POA: Diagnosis not present

## 2018-08-27 DIAGNOSIS — E662 Morbid (severe) obesity with alveolar hypoventilation: Secondary | ICD-10-CM | POA: Diagnosis not present

## 2018-08-27 DIAGNOSIS — J45909 Unspecified asthma, uncomplicated: Secondary | ICD-10-CM | POA: Diagnosis not present

## 2018-08-28 DIAGNOSIS — E662 Morbid (severe) obesity with alveolar hypoventilation: Secondary | ICD-10-CM | POA: Diagnosis not present

## 2018-08-28 DIAGNOSIS — J45909 Unspecified asthma, uncomplicated: Secondary | ICD-10-CM | POA: Diagnosis not present

## 2018-08-28 DIAGNOSIS — I10 Essential (primary) hypertension: Secondary | ICD-10-CM | POA: Diagnosis not present

## 2018-08-29 DIAGNOSIS — J45909 Unspecified asthma, uncomplicated: Secondary | ICD-10-CM | POA: Diagnosis not present

## 2018-08-29 DIAGNOSIS — E662 Morbid (severe) obesity with alveolar hypoventilation: Secondary | ICD-10-CM | POA: Diagnosis not present

## 2018-08-29 DIAGNOSIS — I10 Essential (primary) hypertension: Secondary | ICD-10-CM | POA: Diagnosis not present

## 2018-08-30 DIAGNOSIS — I10 Essential (primary) hypertension: Secondary | ICD-10-CM | POA: Diagnosis not present

## 2018-08-30 DIAGNOSIS — J45909 Unspecified asthma, uncomplicated: Secondary | ICD-10-CM | POA: Diagnosis not present

## 2018-08-30 DIAGNOSIS — E662 Morbid (severe) obesity with alveolar hypoventilation: Secondary | ICD-10-CM | POA: Diagnosis not present

## 2018-08-31 DIAGNOSIS — E662 Morbid (severe) obesity with alveolar hypoventilation: Secondary | ICD-10-CM | POA: Diagnosis not present

## 2018-08-31 DIAGNOSIS — I10 Essential (primary) hypertension: Secondary | ICD-10-CM | POA: Diagnosis not present

## 2018-08-31 DIAGNOSIS — J45909 Unspecified asthma, uncomplicated: Secondary | ICD-10-CM | POA: Diagnosis not present

## 2018-09-01 DIAGNOSIS — E662 Morbid (severe) obesity with alveolar hypoventilation: Secondary | ICD-10-CM | POA: Diagnosis not present

## 2018-09-01 DIAGNOSIS — I10 Essential (primary) hypertension: Secondary | ICD-10-CM | POA: Diagnosis not present

## 2018-09-01 DIAGNOSIS — J45909 Unspecified asthma, uncomplicated: Secondary | ICD-10-CM | POA: Diagnosis not present

## 2018-09-02 DIAGNOSIS — E662 Morbid (severe) obesity with alveolar hypoventilation: Secondary | ICD-10-CM | POA: Diagnosis not present

## 2018-09-02 DIAGNOSIS — I10 Essential (primary) hypertension: Secondary | ICD-10-CM | POA: Diagnosis not present

## 2018-09-02 DIAGNOSIS — J45909 Unspecified asthma, uncomplicated: Secondary | ICD-10-CM | POA: Diagnosis not present

## 2018-09-03 DIAGNOSIS — I10 Essential (primary) hypertension: Secondary | ICD-10-CM | POA: Diagnosis not present

## 2018-09-03 DIAGNOSIS — E662 Morbid (severe) obesity with alveolar hypoventilation: Secondary | ICD-10-CM | POA: Diagnosis not present

## 2018-09-03 DIAGNOSIS — J45909 Unspecified asthma, uncomplicated: Secondary | ICD-10-CM | POA: Diagnosis not present

## 2018-09-04 DIAGNOSIS — E662 Morbid (severe) obesity with alveolar hypoventilation: Secondary | ICD-10-CM | POA: Diagnosis not present

## 2018-09-04 DIAGNOSIS — I10 Essential (primary) hypertension: Secondary | ICD-10-CM | POA: Diagnosis not present

## 2018-09-04 DIAGNOSIS — J45909 Unspecified asthma, uncomplicated: Secondary | ICD-10-CM | POA: Diagnosis not present

## 2018-09-05 DIAGNOSIS — E662 Morbid (severe) obesity with alveolar hypoventilation: Secondary | ICD-10-CM | POA: Diagnosis not present

## 2018-09-05 DIAGNOSIS — J45909 Unspecified asthma, uncomplicated: Secondary | ICD-10-CM | POA: Diagnosis not present

## 2018-09-05 DIAGNOSIS — I10 Essential (primary) hypertension: Secondary | ICD-10-CM | POA: Diagnosis not present

## 2018-09-06 DIAGNOSIS — J45909 Unspecified asthma, uncomplicated: Secondary | ICD-10-CM | POA: Diagnosis not present

## 2018-09-06 DIAGNOSIS — I10 Essential (primary) hypertension: Secondary | ICD-10-CM | POA: Diagnosis not present

## 2018-09-06 DIAGNOSIS — E662 Morbid (severe) obesity with alveolar hypoventilation: Secondary | ICD-10-CM | POA: Diagnosis not present

## 2018-09-07 DIAGNOSIS — E662 Morbid (severe) obesity with alveolar hypoventilation: Secondary | ICD-10-CM | POA: Diagnosis not present

## 2018-09-07 DIAGNOSIS — J45909 Unspecified asthma, uncomplicated: Secondary | ICD-10-CM | POA: Diagnosis not present

## 2018-09-07 DIAGNOSIS — I10 Essential (primary) hypertension: Secondary | ICD-10-CM | POA: Diagnosis not present

## 2018-09-08 DIAGNOSIS — J45909 Unspecified asthma, uncomplicated: Secondary | ICD-10-CM | POA: Diagnosis not present

## 2018-09-08 DIAGNOSIS — E662 Morbid (severe) obesity with alveolar hypoventilation: Secondary | ICD-10-CM | POA: Diagnosis not present

## 2018-09-08 DIAGNOSIS — I10 Essential (primary) hypertension: Secondary | ICD-10-CM | POA: Diagnosis not present

## 2018-09-09 DIAGNOSIS — I10 Essential (primary) hypertension: Secondary | ICD-10-CM | POA: Diagnosis not present

## 2018-09-09 DIAGNOSIS — E662 Morbid (severe) obesity with alveolar hypoventilation: Secondary | ICD-10-CM | POA: Diagnosis not present

## 2018-09-09 DIAGNOSIS — J45909 Unspecified asthma, uncomplicated: Secondary | ICD-10-CM | POA: Diagnosis not present

## 2018-09-10 DIAGNOSIS — J45909 Unspecified asthma, uncomplicated: Secondary | ICD-10-CM | POA: Diagnosis not present

## 2018-09-10 DIAGNOSIS — I10 Essential (primary) hypertension: Secondary | ICD-10-CM | POA: Diagnosis not present

## 2018-09-10 DIAGNOSIS — E662 Morbid (severe) obesity with alveolar hypoventilation: Secondary | ICD-10-CM | POA: Diagnosis not present

## 2018-09-11 DIAGNOSIS — J45909 Unspecified asthma, uncomplicated: Secondary | ICD-10-CM | POA: Diagnosis not present

## 2018-09-11 DIAGNOSIS — I10 Essential (primary) hypertension: Secondary | ICD-10-CM | POA: Diagnosis not present

## 2018-09-11 DIAGNOSIS — E662 Morbid (severe) obesity with alveolar hypoventilation: Secondary | ICD-10-CM | POA: Diagnosis not present

## 2018-09-12 DIAGNOSIS — I10 Essential (primary) hypertension: Secondary | ICD-10-CM | POA: Diagnosis not present

## 2018-09-12 DIAGNOSIS — J45909 Unspecified asthma, uncomplicated: Secondary | ICD-10-CM | POA: Diagnosis not present

## 2018-09-12 DIAGNOSIS — E662 Morbid (severe) obesity with alveolar hypoventilation: Secondary | ICD-10-CM | POA: Diagnosis not present

## 2018-09-13 DIAGNOSIS — I10 Essential (primary) hypertension: Secondary | ICD-10-CM | POA: Diagnosis not present

## 2018-09-13 DIAGNOSIS — E662 Morbid (severe) obesity with alveolar hypoventilation: Secondary | ICD-10-CM | POA: Diagnosis not present

## 2018-09-13 DIAGNOSIS — J45909 Unspecified asthma, uncomplicated: Secondary | ICD-10-CM | POA: Diagnosis not present

## 2018-09-14 DIAGNOSIS — E662 Morbid (severe) obesity with alveolar hypoventilation: Secondary | ICD-10-CM | POA: Diagnosis not present

## 2018-09-14 DIAGNOSIS — J45909 Unspecified asthma, uncomplicated: Secondary | ICD-10-CM | POA: Diagnosis not present

## 2018-09-14 DIAGNOSIS — I10 Essential (primary) hypertension: Secondary | ICD-10-CM | POA: Diagnosis not present

## 2018-09-15 DIAGNOSIS — J45909 Unspecified asthma, uncomplicated: Secondary | ICD-10-CM | POA: Diagnosis not present

## 2018-09-15 DIAGNOSIS — E662 Morbid (severe) obesity with alveolar hypoventilation: Secondary | ICD-10-CM | POA: Diagnosis not present

## 2018-09-15 DIAGNOSIS — I10 Essential (primary) hypertension: Secondary | ICD-10-CM | POA: Diagnosis not present

## 2018-09-16 DIAGNOSIS — I10 Essential (primary) hypertension: Secondary | ICD-10-CM | POA: Diagnosis not present

## 2018-09-16 DIAGNOSIS — E662 Morbid (severe) obesity with alveolar hypoventilation: Secondary | ICD-10-CM | POA: Diagnosis not present

## 2018-09-16 DIAGNOSIS — J45909 Unspecified asthma, uncomplicated: Secondary | ICD-10-CM | POA: Diagnosis not present

## 2018-09-17 DIAGNOSIS — J45909 Unspecified asthma, uncomplicated: Secondary | ICD-10-CM | POA: Diagnosis not present

## 2018-09-17 DIAGNOSIS — I10 Essential (primary) hypertension: Secondary | ICD-10-CM | POA: Diagnosis not present

## 2018-09-17 DIAGNOSIS — E662 Morbid (severe) obesity with alveolar hypoventilation: Secondary | ICD-10-CM | POA: Diagnosis not present

## 2018-09-18 DIAGNOSIS — J45909 Unspecified asthma, uncomplicated: Secondary | ICD-10-CM | POA: Diagnosis not present

## 2018-09-18 DIAGNOSIS — I10 Essential (primary) hypertension: Secondary | ICD-10-CM | POA: Diagnosis not present

## 2018-09-18 DIAGNOSIS — E662 Morbid (severe) obesity with alveolar hypoventilation: Secondary | ICD-10-CM | POA: Diagnosis not present

## 2018-09-19 DIAGNOSIS — J45909 Unspecified asthma, uncomplicated: Secondary | ICD-10-CM | POA: Diagnosis not present

## 2018-09-19 DIAGNOSIS — E662 Morbid (severe) obesity with alveolar hypoventilation: Secondary | ICD-10-CM | POA: Diagnosis not present

## 2018-09-19 DIAGNOSIS — I10 Essential (primary) hypertension: Secondary | ICD-10-CM | POA: Diagnosis not present

## 2018-09-20 DIAGNOSIS — J45909 Unspecified asthma, uncomplicated: Secondary | ICD-10-CM | POA: Diagnosis not present

## 2018-09-20 DIAGNOSIS — I10 Essential (primary) hypertension: Secondary | ICD-10-CM | POA: Diagnosis not present

## 2018-09-20 DIAGNOSIS — E662 Morbid (severe) obesity with alveolar hypoventilation: Secondary | ICD-10-CM | POA: Diagnosis not present

## 2018-09-21 DIAGNOSIS — I10 Essential (primary) hypertension: Secondary | ICD-10-CM | POA: Diagnosis not present

## 2018-09-21 DIAGNOSIS — E662 Morbid (severe) obesity with alveolar hypoventilation: Secondary | ICD-10-CM | POA: Diagnosis not present

## 2018-09-21 DIAGNOSIS — J45909 Unspecified asthma, uncomplicated: Secondary | ICD-10-CM | POA: Diagnosis not present

## 2018-09-22 DIAGNOSIS — E662 Morbid (severe) obesity with alveolar hypoventilation: Secondary | ICD-10-CM | POA: Diagnosis not present

## 2018-09-22 DIAGNOSIS — I10 Essential (primary) hypertension: Secondary | ICD-10-CM | POA: Diagnosis not present

## 2018-09-22 DIAGNOSIS — J45909 Unspecified asthma, uncomplicated: Secondary | ICD-10-CM | POA: Diagnosis not present

## 2018-09-23 DIAGNOSIS — E662 Morbid (severe) obesity with alveolar hypoventilation: Secondary | ICD-10-CM | POA: Diagnosis not present

## 2018-09-23 DIAGNOSIS — I10 Essential (primary) hypertension: Secondary | ICD-10-CM | POA: Diagnosis not present

## 2018-09-23 DIAGNOSIS — J45909 Unspecified asthma, uncomplicated: Secondary | ICD-10-CM | POA: Diagnosis not present

## 2018-09-24 DIAGNOSIS — J45909 Unspecified asthma, uncomplicated: Secondary | ICD-10-CM | POA: Diagnosis not present

## 2018-09-24 DIAGNOSIS — E662 Morbid (severe) obesity with alveolar hypoventilation: Secondary | ICD-10-CM | POA: Diagnosis not present

## 2018-09-24 DIAGNOSIS — I10 Essential (primary) hypertension: Secondary | ICD-10-CM | POA: Diagnosis not present

## 2018-09-25 DIAGNOSIS — I10 Essential (primary) hypertension: Secondary | ICD-10-CM | POA: Diagnosis not present

## 2018-09-25 DIAGNOSIS — E662 Morbid (severe) obesity with alveolar hypoventilation: Secondary | ICD-10-CM | POA: Diagnosis not present

## 2018-09-25 DIAGNOSIS — J45909 Unspecified asthma, uncomplicated: Secondary | ICD-10-CM | POA: Diagnosis not present

## 2018-09-26 DIAGNOSIS — J45909 Unspecified asthma, uncomplicated: Secondary | ICD-10-CM | POA: Diagnosis not present

## 2018-09-26 DIAGNOSIS — E662 Morbid (severe) obesity with alveolar hypoventilation: Secondary | ICD-10-CM | POA: Diagnosis not present

## 2018-09-26 DIAGNOSIS — I10 Essential (primary) hypertension: Secondary | ICD-10-CM | POA: Diagnosis not present

## 2018-09-27 DIAGNOSIS — E662 Morbid (severe) obesity with alveolar hypoventilation: Secondary | ICD-10-CM | POA: Diagnosis not present

## 2018-09-27 DIAGNOSIS — J45909 Unspecified asthma, uncomplicated: Secondary | ICD-10-CM | POA: Diagnosis not present

## 2018-09-27 DIAGNOSIS — I10 Essential (primary) hypertension: Secondary | ICD-10-CM | POA: Diagnosis not present

## 2018-09-28 DIAGNOSIS — J45909 Unspecified asthma, uncomplicated: Secondary | ICD-10-CM | POA: Diagnosis not present

## 2018-09-28 DIAGNOSIS — E662 Morbid (severe) obesity with alveolar hypoventilation: Secondary | ICD-10-CM | POA: Diagnosis not present

## 2018-09-28 DIAGNOSIS — I10 Essential (primary) hypertension: Secondary | ICD-10-CM | POA: Diagnosis not present

## 2018-09-29 DIAGNOSIS — I10 Essential (primary) hypertension: Secondary | ICD-10-CM | POA: Diagnosis not present

## 2018-09-29 DIAGNOSIS — J45909 Unspecified asthma, uncomplicated: Secondary | ICD-10-CM | POA: Diagnosis not present

## 2018-09-29 DIAGNOSIS — E662 Morbid (severe) obesity with alveolar hypoventilation: Secondary | ICD-10-CM | POA: Diagnosis not present

## 2018-09-30 DIAGNOSIS — I10 Essential (primary) hypertension: Secondary | ICD-10-CM | POA: Diagnosis not present

## 2018-09-30 DIAGNOSIS — E662 Morbid (severe) obesity with alveolar hypoventilation: Secondary | ICD-10-CM | POA: Diagnosis not present

## 2018-09-30 DIAGNOSIS — J45909 Unspecified asthma, uncomplicated: Secondary | ICD-10-CM | POA: Diagnosis not present

## 2018-10-01 DIAGNOSIS — J45909 Unspecified asthma, uncomplicated: Secondary | ICD-10-CM | POA: Diagnosis not present

## 2018-10-01 DIAGNOSIS — E662 Morbid (severe) obesity with alveolar hypoventilation: Secondary | ICD-10-CM | POA: Diagnosis not present

## 2018-10-01 DIAGNOSIS — I10 Essential (primary) hypertension: Secondary | ICD-10-CM | POA: Diagnosis not present

## 2018-10-02 DIAGNOSIS — I10 Essential (primary) hypertension: Secondary | ICD-10-CM | POA: Diagnosis not present

## 2018-10-02 DIAGNOSIS — J45909 Unspecified asthma, uncomplicated: Secondary | ICD-10-CM | POA: Diagnosis not present

## 2018-10-02 DIAGNOSIS — E662 Morbid (severe) obesity with alveolar hypoventilation: Secondary | ICD-10-CM | POA: Diagnosis not present

## 2018-10-03 DIAGNOSIS — I10 Essential (primary) hypertension: Secondary | ICD-10-CM | POA: Diagnosis not present

## 2018-10-03 DIAGNOSIS — J45909 Unspecified asthma, uncomplicated: Secondary | ICD-10-CM | POA: Diagnosis not present

## 2018-10-03 DIAGNOSIS — E662 Morbid (severe) obesity with alveolar hypoventilation: Secondary | ICD-10-CM | POA: Diagnosis not present

## 2018-10-04 DIAGNOSIS — E662 Morbid (severe) obesity with alveolar hypoventilation: Secondary | ICD-10-CM | POA: Diagnosis not present

## 2018-10-04 DIAGNOSIS — J45909 Unspecified asthma, uncomplicated: Secondary | ICD-10-CM | POA: Diagnosis not present

## 2018-10-04 DIAGNOSIS — I10 Essential (primary) hypertension: Secondary | ICD-10-CM | POA: Diagnosis not present

## 2018-10-05 DIAGNOSIS — J45909 Unspecified asthma, uncomplicated: Secondary | ICD-10-CM | POA: Diagnosis not present

## 2018-10-05 DIAGNOSIS — E662 Morbid (severe) obesity with alveolar hypoventilation: Secondary | ICD-10-CM | POA: Diagnosis not present

## 2018-10-05 DIAGNOSIS — I10 Essential (primary) hypertension: Secondary | ICD-10-CM | POA: Diagnosis not present

## 2018-10-06 DIAGNOSIS — I10 Essential (primary) hypertension: Secondary | ICD-10-CM | POA: Diagnosis not present

## 2018-10-06 DIAGNOSIS — E662 Morbid (severe) obesity with alveolar hypoventilation: Secondary | ICD-10-CM | POA: Diagnosis not present

## 2018-10-06 DIAGNOSIS — J45909 Unspecified asthma, uncomplicated: Secondary | ICD-10-CM | POA: Diagnosis not present

## 2018-10-07 DIAGNOSIS — E662 Morbid (severe) obesity with alveolar hypoventilation: Secondary | ICD-10-CM | POA: Diagnosis not present

## 2018-10-07 DIAGNOSIS — J45909 Unspecified asthma, uncomplicated: Secondary | ICD-10-CM | POA: Diagnosis not present

## 2018-10-07 DIAGNOSIS — I10 Essential (primary) hypertension: Secondary | ICD-10-CM | POA: Diagnosis not present

## 2018-10-08 DIAGNOSIS — J45909 Unspecified asthma, uncomplicated: Secondary | ICD-10-CM | POA: Diagnosis not present

## 2018-10-08 DIAGNOSIS — E662 Morbid (severe) obesity with alveolar hypoventilation: Secondary | ICD-10-CM | POA: Diagnosis not present

## 2018-10-08 DIAGNOSIS — I10 Essential (primary) hypertension: Secondary | ICD-10-CM | POA: Diagnosis not present

## 2018-10-09 DIAGNOSIS — J45909 Unspecified asthma, uncomplicated: Secondary | ICD-10-CM | POA: Diagnosis not present

## 2018-10-09 DIAGNOSIS — I10 Essential (primary) hypertension: Secondary | ICD-10-CM | POA: Diagnosis not present

## 2018-10-09 DIAGNOSIS — E662 Morbid (severe) obesity with alveolar hypoventilation: Secondary | ICD-10-CM | POA: Diagnosis not present

## 2018-10-10 DIAGNOSIS — I10 Essential (primary) hypertension: Secondary | ICD-10-CM | POA: Diagnosis not present

## 2018-10-10 DIAGNOSIS — E662 Morbid (severe) obesity with alveolar hypoventilation: Secondary | ICD-10-CM | POA: Diagnosis not present

## 2018-10-10 DIAGNOSIS — J45909 Unspecified asthma, uncomplicated: Secondary | ICD-10-CM | POA: Diagnosis not present

## 2018-10-11 DIAGNOSIS — E662 Morbid (severe) obesity with alveolar hypoventilation: Secondary | ICD-10-CM | POA: Diagnosis not present

## 2018-10-11 DIAGNOSIS — J45909 Unspecified asthma, uncomplicated: Secondary | ICD-10-CM | POA: Diagnosis not present

## 2018-10-11 DIAGNOSIS — I10 Essential (primary) hypertension: Secondary | ICD-10-CM | POA: Diagnosis not present

## 2018-10-12 DIAGNOSIS — J45909 Unspecified asthma, uncomplicated: Secondary | ICD-10-CM | POA: Diagnosis not present

## 2018-10-12 DIAGNOSIS — E662 Morbid (severe) obesity with alveolar hypoventilation: Secondary | ICD-10-CM | POA: Diagnosis not present

## 2018-10-12 DIAGNOSIS — I10 Essential (primary) hypertension: Secondary | ICD-10-CM | POA: Diagnosis not present

## 2018-10-13 DIAGNOSIS — I10 Essential (primary) hypertension: Secondary | ICD-10-CM | POA: Diagnosis not present

## 2018-10-13 DIAGNOSIS — E662 Morbid (severe) obesity with alveolar hypoventilation: Secondary | ICD-10-CM | POA: Diagnosis not present

## 2018-10-13 DIAGNOSIS — J45909 Unspecified asthma, uncomplicated: Secondary | ICD-10-CM | POA: Diagnosis not present

## 2018-10-14 DIAGNOSIS — I10 Essential (primary) hypertension: Secondary | ICD-10-CM | POA: Diagnosis not present

## 2018-10-14 DIAGNOSIS — J45909 Unspecified asthma, uncomplicated: Secondary | ICD-10-CM | POA: Diagnosis not present

## 2018-10-14 DIAGNOSIS — E662 Morbid (severe) obesity with alveolar hypoventilation: Secondary | ICD-10-CM | POA: Diagnosis not present

## 2018-10-15 DIAGNOSIS — I10 Essential (primary) hypertension: Secondary | ICD-10-CM | POA: Diagnosis not present

## 2018-10-15 DIAGNOSIS — E662 Morbid (severe) obesity with alveolar hypoventilation: Secondary | ICD-10-CM | POA: Diagnosis not present

## 2018-10-15 DIAGNOSIS — J45909 Unspecified asthma, uncomplicated: Secondary | ICD-10-CM | POA: Diagnosis not present

## 2018-10-16 DIAGNOSIS — E662 Morbid (severe) obesity with alveolar hypoventilation: Secondary | ICD-10-CM | POA: Diagnosis not present

## 2018-10-16 DIAGNOSIS — J45909 Unspecified asthma, uncomplicated: Secondary | ICD-10-CM | POA: Diagnosis not present

## 2018-10-16 DIAGNOSIS — I10 Essential (primary) hypertension: Secondary | ICD-10-CM | POA: Diagnosis not present

## 2018-10-17 DIAGNOSIS — J45909 Unspecified asthma, uncomplicated: Secondary | ICD-10-CM | POA: Diagnosis not present

## 2018-10-17 DIAGNOSIS — I10 Essential (primary) hypertension: Secondary | ICD-10-CM | POA: Diagnosis not present

## 2018-10-17 DIAGNOSIS — E662 Morbid (severe) obesity with alveolar hypoventilation: Secondary | ICD-10-CM | POA: Diagnosis not present

## 2018-10-18 DIAGNOSIS — E662 Morbid (severe) obesity with alveolar hypoventilation: Secondary | ICD-10-CM | POA: Diagnosis not present

## 2018-10-18 DIAGNOSIS — I10 Essential (primary) hypertension: Secondary | ICD-10-CM | POA: Diagnosis not present

## 2018-10-18 DIAGNOSIS — J45909 Unspecified asthma, uncomplicated: Secondary | ICD-10-CM | POA: Diagnosis not present

## 2018-10-19 DIAGNOSIS — J45909 Unspecified asthma, uncomplicated: Secondary | ICD-10-CM | POA: Diagnosis not present

## 2018-10-19 DIAGNOSIS — I10 Essential (primary) hypertension: Secondary | ICD-10-CM | POA: Diagnosis not present

## 2018-10-19 DIAGNOSIS — E662 Morbid (severe) obesity with alveolar hypoventilation: Secondary | ICD-10-CM | POA: Diagnosis not present

## 2018-10-20 DIAGNOSIS — E662 Morbid (severe) obesity with alveolar hypoventilation: Secondary | ICD-10-CM | POA: Diagnosis not present

## 2018-10-20 DIAGNOSIS — J45909 Unspecified asthma, uncomplicated: Secondary | ICD-10-CM | POA: Diagnosis not present

## 2018-10-20 DIAGNOSIS — I10 Essential (primary) hypertension: Secondary | ICD-10-CM | POA: Diagnosis not present

## 2018-10-21 DIAGNOSIS — I10 Essential (primary) hypertension: Secondary | ICD-10-CM | POA: Diagnosis not present

## 2018-10-21 DIAGNOSIS — J45909 Unspecified asthma, uncomplicated: Secondary | ICD-10-CM | POA: Diagnosis not present

## 2018-10-21 DIAGNOSIS — E662 Morbid (severe) obesity with alveolar hypoventilation: Secondary | ICD-10-CM | POA: Diagnosis not present

## 2018-10-22 DIAGNOSIS — J45909 Unspecified asthma, uncomplicated: Secondary | ICD-10-CM | POA: Diagnosis not present

## 2018-10-22 DIAGNOSIS — I10 Essential (primary) hypertension: Secondary | ICD-10-CM | POA: Diagnosis not present

## 2018-10-22 DIAGNOSIS — E662 Morbid (severe) obesity with alveolar hypoventilation: Secondary | ICD-10-CM | POA: Diagnosis not present

## 2018-10-23 DIAGNOSIS — J45909 Unspecified asthma, uncomplicated: Secondary | ICD-10-CM | POA: Diagnosis not present

## 2018-10-23 DIAGNOSIS — I10 Essential (primary) hypertension: Secondary | ICD-10-CM | POA: Diagnosis not present

## 2018-10-23 DIAGNOSIS — E662 Morbid (severe) obesity with alveolar hypoventilation: Secondary | ICD-10-CM | POA: Diagnosis not present

## 2018-10-24 DIAGNOSIS — I10 Essential (primary) hypertension: Secondary | ICD-10-CM | POA: Diagnosis not present

## 2018-10-24 DIAGNOSIS — J45909 Unspecified asthma, uncomplicated: Secondary | ICD-10-CM | POA: Diagnosis not present

## 2018-10-24 DIAGNOSIS — E662 Morbid (severe) obesity with alveolar hypoventilation: Secondary | ICD-10-CM | POA: Diagnosis not present

## 2018-10-25 DIAGNOSIS — I10 Essential (primary) hypertension: Secondary | ICD-10-CM | POA: Diagnosis not present

## 2018-10-25 DIAGNOSIS — E662 Morbid (severe) obesity with alveolar hypoventilation: Secondary | ICD-10-CM | POA: Diagnosis not present

## 2018-10-25 DIAGNOSIS — J45909 Unspecified asthma, uncomplicated: Secondary | ICD-10-CM | POA: Diagnosis not present

## 2018-10-26 DIAGNOSIS — J45909 Unspecified asthma, uncomplicated: Secondary | ICD-10-CM | POA: Diagnosis not present

## 2018-10-26 DIAGNOSIS — E662 Morbid (severe) obesity with alveolar hypoventilation: Secondary | ICD-10-CM | POA: Diagnosis not present

## 2018-10-26 DIAGNOSIS — I10 Essential (primary) hypertension: Secondary | ICD-10-CM | POA: Diagnosis not present

## 2018-10-27 DIAGNOSIS — J45909 Unspecified asthma, uncomplicated: Secondary | ICD-10-CM | POA: Diagnosis not present

## 2018-10-27 DIAGNOSIS — E662 Morbid (severe) obesity with alveolar hypoventilation: Secondary | ICD-10-CM | POA: Diagnosis not present

## 2018-10-27 DIAGNOSIS — I10 Essential (primary) hypertension: Secondary | ICD-10-CM | POA: Diagnosis not present

## 2018-10-28 DIAGNOSIS — I10 Essential (primary) hypertension: Secondary | ICD-10-CM | POA: Diagnosis not present

## 2018-10-28 DIAGNOSIS — J45909 Unspecified asthma, uncomplicated: Secondary | ICD-10-CM | POA: Diagnosis not present

## 2018-10-28 DIAGNOSIS — E662 Morbid (severe) obesity with alveolar hypoventilation: Secondary | ICD-10-CM | POA: Diagnosis not present

## 2018-10-29 DIAGNOSIS — I10 Essential (primary) hypertension: Secondary | ICD-10-CM | POA: Diagnosis not present

## 2018-10-29 DIAGNOSIS — J45909 Unspecified asthma, uncomplicated: Secondary | ICD-10-CM | POA: Diagnosis not present

## 2018-10-29 DIAGNOSIS — E662 Morbid (severe) obesity with alveolar hypoventilation: Secondary | ICD-10-CM | POA: Diagnosis not present

## 2018-10-30 DIAGNOSIS — E662 Morbid (severe) obesity with alveolar hypoventilation: Secondary | ICD-10-CM | POA: Diagnosis not present

## 2018-10-30 DIAGNOSIS — I10 Essential (primary) hypertension: Secondary | ICD-10-CM | POA: Diagnosis not present

## 2018-10-30 DIAGNOSIS — J45909 Unspecified asthma, uncomplicated: Secondary | ICD-10-CM | POA: Diagnosis not present

## 2018-10-31 DIAGNOSIS — E662 Morbid (severe) obesity with alveolar hypoventilation: Secondary | ICD-10-CM | POA: Diagnosis not present

## 2018-10-31 DIAGNOSIS — J45909 Unspecified asthma, uncomplicated: Secondary | ICD-10-CM | POA: Diagnosis not present

## 2018-10-31 DIAGNOSIS — I10 Essential (primary) hypertension: Secondary | ICD-10-CM | POA: Diagnosis not present

## 2018-11-01 DIAGNOSIS — I10 Essential (primary) hypertension: Secondary | ICD-10-CM | POA: Diagnosis not present

## 2018-11-01 DIAGNOSIS — E662 Morbid (severe) obesity with alveolar hypoventilation: Secondary | ICD-10-CM | POA: Diagnosis not present

## 2018-11-01 DIAGNOSIS — J45909 Unspecified asthma, uncomplicated: Secondary | ICD-10-CM | POA: Diagnosis not present

## 2018-11-02 DIAGNOSIS — J45909 Unspecified asthma, uncomplicated: Secondary | ICD-10-CM | POA: Diagnosis not present

## 2018-11-02 DIAGNOSIS — I10 Essential (primary) hypertension: Secondary | ICD-10-CM | POA: Diagnosis not present

## 2018-11-02 DIAGNOSIS — E662 Morbid (severe) obesity with alveolar hypoventilation: Secondary | ICD-10-CM | POA: Diagnosis not present

## 2018-11-03 DIAGNOSIS — I10 Essential (primary) hypertension: Secondary | ICD-10-CM | POA: Diagnosis not present

## 2018-11-03 DIAGNOSIS — E662 Morbid (severe) obesity with alveolar hypoventilation: Secondary | ICD-10-CM | POA: Diagnosis not present

## 2018-11-03 DIAGNOSIS — J45909 Unspecified asthma, uncomplicated: Secondary | ICD-10-CM | POA: Diagnosis not present

## 2018-11-04 DIAGNOSIS — E662 Morbid (severe) obesity with alveolar hypoventilation: Secondary | ICD-10-CM | POA: Diagnosis not present

## 2018-11-04 DIAGNOSIS — I10 Essential (primary) hypertension: Secondary | ICD-10-CM | POA: Diagnosis not present

## 2018-11-04 DIAGNOSIS — J45909 Unspecified asthma, uncomplicated: Secondary | ICD-10-CM | POA: Diagnosis not present

## 2018-11-05 DIAGNOSIS — E662 Morbid (severe) obesity with alveolar hypoventilation: Secondary | ICD-10-CM | POA: Diagnosis not present

## 2018-11-05 DIAGNOSIS — I10 Essential (primary) hypertension: Secondary | ICD-10-CM | POA: Diagnosis not present

## 2018-11-05 DIAGNOSIS — J45909 Unspecified asthma, uncomplicated: Secondary | ICD-10-CM | POA: Diagnosis not present

## 2018-11-06 DIAGNOSIS — J45909 Unspecified asthma, uncomplicated: Secondary | ICD-10-CM | POA: Diagnosis not present

## 2018-11-06 DIAGNOSIS — I10 Essential (primary) hypertension: Secondary | ICD-10-CM | POA: Diagnosis not present

## 2018-11-06 DIAGNOSIS — E662 Morbid (severe) obesity with alveolar hypoventilation: Secondary | ICD-10-CM | POA: Diagnosis not present

## 2018-11-07 DIAGNOSIS — E662 Morbid (severe) obesity with alveolar hypoventilation: Secondary | ICD-10-CM | POA: Diagnosis not present

## 2018-11-07 DIAGNOSIS — J45909 Unspecified asthma, uncomplicated: Secondary | ICD-10-CM | POA: Diagnosis not present

## 2018-11-07 DIAGNOSIS — I10 Essential (primary) hypertension: Secondary | ICD-10-CM | POA: Diagnosis not present

## 2018-11-08 DIAGNOSIS — E662 Morbid (severe) obesity with alveolar hypoventilation: Secondary | ICD-10-CM | POA: Diagnosis not present

## 2018-11-08 DIAGNOSIS — J45909 Unspecified asthma, uncomplicated: Secondary | ICD-10-CM | POA: Diagnosis not present

## 2018-11-08 DIAGNOSIS — I10 Essential (primary) hypertension: Secondary | ICD-10-CM | POA: Diagnosis not present

## 2018-11-09 DIAGNOSIS — J45909 Unspecified asthma, uncomplicated: Secondary | ICD-10-CM | POA: Diagnosis not present

## 2018-11-09 DIAGNOSIS — I10 Essential (primary) hypertension: Secondary | ICD-10-CM | POA: Diagnosis not present

## 2018-11-09 DIAGNOSIS — E662 Morbid (severe) obesity with alveolar hypoventilation: Secondary | ICD-10-CM | POA: Diagnosis not present

## 2018-11-10 DIAGNOSIS — J45909 Unspecified asthma, uncomplicated: Secondary | ICD-10-CM | POA: Diagnosis not present

## 2018-11-10 DIAGNOSIS — E662 Morbid (severe) obesity with alveolar hypoventilation: Secondary | ICD-10-CM | POA: Diagnosis not present

## 2018-11-10 DIAGNOSIS — I10 Essential (primary) hypertension: Secondary | ICD-10-CM | POA: Diagnosis not present

## 2018-11-11 DIAGNOSIS — J45909 Unspecified asthma, uncomplicated: Secondary | ICD-10-CM | POA: Diagnosis not present

## 2018-11-11 DIAGNOSIS — E662 Morbid (severe) obesity with alveolar hypoventilation: Secondary | ICD-10-CM | POA: Diagnosis not present

## 2018-11-11 DIAGNOSIS — I10 Essential (primary) hypertension: Secondary | ICD-10-CM | POA: Diagnosis not present

## 2018-11-12 DIAGNOSIS — I10 Essential (primary) hypertension: Secondary | ICD-10-CM | POA: Diagnosis not present

## 2018-11-12 DIAGNOSIS — E662 Morbid (severe) obesity with alveolar hypoventilation: Secondary | ICD-10-CM | POA: Diagnosis not present

## 2018-11-12 DIAGNOSIS — J45909 Unspecified asthma, uncomplicated: Secondary | ICD-10-CM | POA: Diagnosis not present

## 2018-11-13 DIAGNOSIS — I10 Essential (primary) hypertension: Secondary | ICD-10-CM | POA: Diagnosis not present

## 2018-11-13 DIAGNOSIS — E662 Morbid (severe) obesity with alveolar hypoventilation: Secondary | ICD-10-CM | POA: Diagnosis not present

## 2018-11-13 DIAGNOSIS — J45909 Unspecified asthma, uncomplicated: Secondary | ICD-10-CM | POA: Diagnosis not present

## 2018-11-14 DIAGNOSIS — J45909 Unspecified asthma, uncomplicated: Secondary | ICD-10-CM | POA: Diagnosis not present

## 2018-11-14 DIAGNOSIS — I10 Essential (primary) hypertension: Secondary | ICD-10-CM | POA: Diagnosis not present

## 2018-11-14 DIAGNOSIS — E662 Morbid (severe) obesity with alveolar hypoventilation: Secondary | ICD-10-CM | POA: Diagnosis not present

## 2018-11-15 DIAGNOSIS — I10 Essential (primary) hypertension: Secondary | ICD-10-CM | POA: Diagnosis not present

## 2018-11-15 DIAGNOSIS — E662 Morbid (severe) obesity with alveolar hypoventilation: Secondary | ICD-10-CM | POA: Diagnosis not present

## 2018-11-15 DIAGNOSIS — J45909 Unspecified asthma, uncomplicated: Secondary | ICD-10-CM | POA: Diagnosis not present

## 2018-11-16 DIAGNOSIS — I10 Essential (primary) hypertension: Secondary | ICD-10-CM | POA: Diagnosis not present

## 2018-11-16 DIAGNOSIS — E662 Morbid (severe) obesity with alveolar hypoventilation: Secondary | ICD-10-CM | POA: Diagnosis not present

## 2018-11-16 DIAGNOSIS — J45909 Unspecified asthma, uncomplicated: Secondary | ICD-10-CM | POA: Diagnosis not present

## 2018-11-17 DIAGNOSIS — J45909 Unspecified asthma, uncomplicated: Secondary | ICD-10-CM | POA: Diagnosis not present

## 2018-11-17 DIAGNOSIS — I10 Essential (primary) hypertension: Secondary | ICD-10-CM | POA: Diagnosis not present

## 2018-11-17 DIAGNOSIS — E662 Morbid (severe) obesity with alveolar hypoventilation: Secondary | ICD-10-CM | POA: Diagnosis not present

## 2018-11-18 DIAGNOSIS — E662 Morbid (severe) obesity with alveolar hypoventilation: Secondary | ICD-10-CM | POA: Diagnosis not present

## 2018-11-18 DIAGNOSIS — I10 Essential (primary) hypertension: Secondary | ICD-10-CM | POA: Diagnosis not present

## 2018-11-18 DIAGNOSIS — J45909 Unspecified asthma, uncomplicated: Secondary | ICD-10-CM | POA: Diagnosis not present

## 2018-11-19 DIAGNOSIS — J45909 Unspecified asthma, uncomplicated: Secondary | ICD-10-CM | POA: Diagnosis not present

## 2018-11-19 DIAGNOSIS — E662 Morbid (severe) obesity with alveolar hypoventilation: Secondary | ICD-10-CM | POA: Diagnosis not present

## 2018-11-19 DIAGNOSIS — I10 Essential (primary) hypertension: Secondary | ICD-10-CM | POA: Diagnosis not present

## 2018-11-20 DIAGNOSIS — E662 Morbid (severe) obesity with alveolar hypoventilation: Secondary | ICD-10-CM | POA: Diagnosis not present

## 2018-11-20 DIAGNOSIS — J45909 Unspecified asthma, uncomplicated: Secondary | ICD-10-CM | POA: Diagnosis not present

## 2018-11-20 DIAGNOSIS — I10 Essential (primary) hypertension: Secondary | ICD-10-CM | POA: Diagnosis not present

## 2018-11-21 DIAGNOSIS — I10 Essential (primary) hypertension: Secondary | ICD-10-CM | POA: Diagnosis not present

## 2018-11-21 DIAGNOSIS — J45909 Unspecified asthma, uncomplicated: Secondary | ICD-10-CM | POA: Diagnosis not present

## 2018-11-21 DIAGNOSIS — E662 Morbid (severe) obesity with alveolar hypoventilation: Secondary | ICD-10-CM | POA: Diagnosis not present

## 2018-11-22 DIAGNOSIS — I10 Essential (primary) hypertension: Secondary | ICD-10-CM | POA: Diagnosis not present

## 2018-11-22 DIAGNOSIS — E662 Morbid (severe) obesity with alveolar hypoventilation: Secondary | ICD-10-CM | POA: Diagnosis not present

## 2018-11-22 DIAGNOSIS — J45909 Unspecified asthma, uncomplicated: Secondary | ICD-10-CM | POA: Diagnosis not present

## 2018-11-23 DIAGNOSIS — I10 Essential (primary) hypertension: Secondary | ICD-10-CM | POA: Diagnosis not present

## 2018-11-23 DIAGNOSIS — J45909 Unspecified asthma, uncomplicated: Secondary | ICD-10-CM | POA: Diagnosis not present

## 2018-11-23 DIAGNOSIS — E662 Morbid (severe) obesity with alveolar hypoventilation: Secondary | ICD-10-CM | POA: Diagnosis not present

## 2018-11-24 DIAGNOSIS — I10 Essential (primary) hypertension: Secondary | ICD-10-CM | POA: Diagnosis not present

## 2018-11-24 DIAGNOSIS — J45909 Unspecified asthma, uncomplicated: Secondary | ICD-10-CM | POA: Diagnosis not present

## 2018-11-24 DIAGNOSIS — E662 Morbid (severe) obesity with alveolar hypoventilation: Secondary | ICD-10-CM | POA: Diagnosis not present

## 2018-11-25 DIAGNOSIS — J45909 Unspecified asthma, uncomplicated: Secondary | ICD-10-CM | POA: Diagnosis not present

## 2018-11-25 DIAGNOSIS — I10 Essential (primary) hypertension: Secondary | ICD-10-CM | POA: Diagnosis not present

## 2018-11-25 DIAGNOSIS — E662 Morbid (severe) obesity with alveolar hypoventilation: Secondary | ICD-10-CM | POA: Diagnosis not present

## 2018-11-26 DIAGNOSIS — J45909 Unspecified asthma, uncomplicated: Secondary | ICD-10-CM | POA: Diagnosis not present

## 2018-11-26 DIAGNOSIS — I10 Essential (primary) hypertension: Secondary | ICD-10-CM | POA: Diagnosis not present

## 2018-11-26 DIAGNOSIS — E662 Morbid (severe) obesity with alveolar hypoventilation: Secondary | ICD-10-CM | POA: Diagnosis not present

## 2018-11-27 DIAGNOSIS — E662 Morbid (severe) obesity with alveolar hypoventilation: Secondary | ICD-10-CM | POA: Diagnosis not present

## 2018-11-27 DIAGNOSIS — J45909 Unspecified asthma, uncomplicated: Secondary | ICD-10-CM | POA: Diagnosis not present

## 2018-11-27 DIAGNOSIS — I10 Essential (primary) hypertension: Secondary | ICD-10-CM | POA: Diagnosis not present

## 2018-11-28 DIAGNOSIS — E662 Morbid (severe) obesity with alveolar hypoventilation: Secondary | ICD-10-CM | POA: Diagnosis not present

## 2018-11-28 DIAGNOSIS — I10 Essential (primary) hypertension: Secondary | ICD-10-CM | POA: Diagnosis not present

## 2018-11-28 DIAGNOSIS — J45909 Unspecified asthma, uncomplicated: Secondary | ICD-10-CM | POA: Diagnosis not present

## 2018-11-29 DIAGNOSIS — E662 Morbid (severe) obesity with alveolar hypoventilation: Secondary | ICD-10-CM | POA: Diagnosis not present

## 2018-11-29 DIAGNOSIS — I10 Essential (primary) hypertension: Secondary | ICD-10-CM | POA: Diagnosis not present

## 2018-11-29 DIAGNOSIS — J45909 Unspecified asthma, uncomplicated: Secondary | ICD-10-CM | POA: Diagnosis not present

## 2018-11-30 DIAGNOSIS — E662 Morbid (severe) obesity with alveolar hypoventilation: Secondary | ICD-10-CM | POA: Diagnosis not present

## 2018-11-30 DIAGNOSIS — J45909 Unspecified asthma, uncomplicated: Secondary | ICD-10-CM | POA: Diagnosis not present

## 2018-11-30 DIAGNOSIS — I10 Essential (primary) hypertension: Secondary | ICD-10-CM | POA: Diagnosis not present

## 2018-12-01 DIAGNOSIS — J45909 Unspecified asthma, uncomplicated: Secondary | ICD-10-CM | POA: Diagnosis not present

## 2018-12-01 DIAGNOSIS — E662 Morbid (severe) obesity with alveolar hypoventilation: Secondary | ICD-10-CM | POA: Diagnosis not present

## 2018-12-01 DIAGNOSIS — I10 Essential (primary) hypertension: Secondary | ICD-10-CM | POA: Diagnosis not present

## 2018-12-02 DIAGNOSIS — E662 Morbid (severe) obesity with alveolar hypoventilation: Secondary | ICD-10-CM | POA: Diagnosis not present

## 2018-12-02 DIAGNOSIS — J45909 Unspecified asthma, uncomplicated: Secondary | ICD-10-CM | POA: Diagnosis not present

## 2018-12-02 DIAGNOSIS — I10 Essential (primary) hypertension: Secondary | ICD-10-CM | POA: Diagnosis not present

## 2018-12-03 DIAGNOSIS — J45909 Unspecified asthma, uncomplicated: Secondary | ICD-10-CM | POA: Diagnosis not present

## 2018-12-03 DIAGNOSIS — E662 Morbid (severe) obesity with alveolar hypoventilation: Secondary | ICD-10-CM | POA: Diagnosis not present

## 2018-12-03 DIAGNOSIS — I10 Essential (primary) hypertension: Secondary | ICD-10-CM | POA: Diagnosis not present

## 2018-12-04 DIAGNOSIS — I10 Essential (primary) hypertension: Secondary | ICD-10-CM | POA: Diagnosis not present

## 2018-12-04 DIAGNOSIS — E662 Morbid (severe) obesity with alveolar hypoventilation: Secondary | ICD-10-CM | POA: Diagnosis not present

## 2018-12-04 DIAGNOSIS — J45909 Unspecified asthma, uncomplicated: Secondary | ICD-10-CM | POA: Diagnosis not present

## 2018-12-05 DIAGNOSIS — E662 Morbid (severe) obesity with alveolar hypoventilation: Secondary | ICD-10-CM | POA: Diagnosis not present

## 2018-12-05 DIAGNOSIS — J45909 Unspecified asthma, uncomplicated: Secondary | ICD-10-CM | POA: Diagnosis not present

## 2018-12-05 DIAGNOSIS — I10 Essential (primary) hypertension: Secondary | ICD-10-CM | POA: Diagnosis not present

## 2018-12-06 DIAGNOSIS — I10 Essential (primary) hypertension: Secondary | ICD-10-CM | POA: Diagnosis not present

## 2018-12-06 DIAGNOSIS — J45909 Unspecified asthma, uncomplicated: Secondary | ICD-10-CM | POA: Diagnosis not present

## 2018-12-06 DIAGNOSIS — E662 Morbid (severe) obesity with alveolar hypoventilation: Secondary | ICD-10-CM | POA: Diagnosis not present

## 2018-12-07 DIAGNOSIS — E662 Morbid (severe) obesity with alveolar hypoventilation: Secondary | ICD-10-CM | POA: Diagnosis not present

## 2018-12-07 DIAGNOSIS — J45909 Unspecified asthma, uncomplicated: Secondary | ICD-10-CM | POA: Diagnosis not present

## 2018-12-07 DIAGNOSIS — I10 Essential (primary) hypertension: Secondary | ICD-10-CM | POA: Diagnosis not present

## 2018-12-08 DIAGNOSIS — J45909 Unspecified asthma, uncomplicated: Secondary | ICD-10-CM | POA: Diagnosis not present

## 2018-12-08 DIAGNOSIS — I10 Essential (primary) hypertension: Secondary | ICD-10-CM | POA: Diagnosis not present

## 2018-12-08 DIAGNOSIS — E662 Morbid (severe) obesity with alveolar hypoventilation: Secondary | ICD-10-CM | POA: Diagnosis not present

## 2018-12-09 DIAGNOSIS — E662 Morbid (severe) obesity with alveolar hypoventilation: Secondary | ICD-10-CM | POA: Diagnosis not present

## 2018-12-09 DIAGNOSIS — J45909 Unspecified asthma, uncomplicated: Secondary | ICD-10-CM | POA: Diagnosis not present

## 2018-12-09 DIAGNOSIS — I10 Essential (primary) hypertension: Secondary | ICD-10-CM | POA: Diagnosis not present

## 2018-12-10 DIAGNOSIS — I10 Essential (primary) hypertension: Secondary | ICD-10-CM | POA: Diagnosis not present

## 2018-12-10 DIAGNOSIS — J45909 Unspecified asthma, uncomplicated: Secondary | ICD-10-CM | POA: Diagnosis not present

## 2018-12-10 DIAGNOSIS — E662 Morbid (severe) obesity with alveolar hypoventilation: Secondary | ICD-10-CM | POA: Diagnosis not present

## 2018-12-11 DIAGNOSIS — I10 Essential (primary) hypertension: Secondary | ICD-10-CM | POA: Diagnosis not present

## 2018-12-11 DIAGNOSIS — E662 Morbid (severe) obesity with alveolar hypoventilation: Secondary | ICD-10-CM | POA: Diagnosis not present

## 2018-12-11 DIAGNOSIS — J45909 Unspecified asthma, uncomplicated: Secondary | ICD-10-CM | POA: Diagnosis not present

## 2018-12-12 DIAGNOSIS — E662 Morbid (severe) obesity with alveolar hypoventilation: Secondary | ICD-10-CM | POA: Diagnosis not present

## 2018-12-12 DIAGNOSIS — I10 Essential (primary) hypertension: Secondary | ICD-10-CM | POA: Diagnosis not present

## 2018-12-12 DIAGNOSIS — J45909 Unspecified asthma, uncomplicated: Secondary | ICD-10-CM | POA: Diagnosis not present

## 2018-12-13 DIAGNOSIS — J45909 Unspecified asthma, uncomplicated: Secondary | ICD-10-CM | POA: Diagnosis not present

## 2018-12-13 DIAGNOSIS — I10 Essential (primary) hypertension: Secondary | ICD-10-CM | POA: Diagnosis not present

## 2018-12-13 DIAGNOSIS — E662 Morbid (severe) obesity with alveolar hypoventilation: Secondary | ICD-10-CM | POA: Diagnosis not present

## 2018-12-14 DIAGNOSIS — I10 Essential (primary) hypertension: Secondary | ICD-10-CM | POA: Diagnosis not present

## 2018-12-14 DIAGNOSIS — E662 Morbid (severe) obesity with alveolar hypoventilation: Secondary | ICD-10-CM | POA: Diagnosis not present

## 2018-12-14 DIAGNOSIS — J45909 Unspecified asthma, uncomplicated: Secondary | ICD-10-CM | POA: Diagnosis not present

## 2018-12-15 DIAGNOSIS — I10 Essential (primary) hypertension: Secondary | ICD-10-CM | POA: Diagnosis not present

## 2018-12-15 DIAGNOSIS — J45909 Unspecified asthma, uncomplicated: Secondary | ICD-10-CM | POA: Diagnosis not present

## 2018-12-15 DIAGNOSIS — E662 Morbid (severe) obesity with alveolar hypoventilation: Secondary | ICD-10-CM | POA: Diagnosis not present

## 2018-12-16 DIAGNOSIS — I10 Essential (primary) hypertension: Secondary | ICD-10-CM | POA: Diagnosis not present

## 2018-12-16 DIAGNOSIS — J45909 Unspecified asthma, uncomplicated: Secondary | ICD-10-CM | POA: Diagnosis not present

## 2018-12-16 DIAGNOSIS — E662 Morbid (severe) obesity with alveolar hypoventilation: Secondary | ICD-10-CM | POA: Diagnosis not present

## 2018-12-17 DIAGNOSIS — E662 Morbid (severe) obesity with alveolar hypoventilation: Secondary | ICD-10-CM | POA: Diagnosis not present

## 2018-12-17 DIAGNOSIS — J45909 Unspecified asthma, uncomplicated: Secondary | ICD-10-CM | POA: Diagnosis not present

## 2018-12-17 DIAGNOSIS — I10 Essential (primary) hypertension: Secondary | ICD-10-CM | POA: Diagnosis not present

## 2018-12-18 DIAGNOSIS — I10 Essential (primary) hypertension: Secondary | ICD-10-CM | POA: Diagnosis not present

## 2018-12-18 DIAGNOSIS — J45909 Unspecified asthma, uncomplicated: Secondary | ICD-10-CM | POA: Diagnosis not present

## 2018-12-18 DIAGNOSIS — E662 Morbid (severe) obesity with alveolar hypoventilation: Secondary | ICD-10-CM | POA: Diagnosis not present

## 2018-12-19 DIAGNOSIS — E662 Morbid (severe) obesity with alveolar hypoventilation: Secondary | ICD-10-CM | POA: Diagnosis not present

## 2018-12-19 DIAGNOSIS — J45909 Unspecified asthma, uncomplicated: Secondary | ICD-10-CM | POA: Diagnosis not present

## 2018-12-19 DIAGNOSIS — I10 Essential (primary) hypertension: Secondary | ICD-10-CM | POA: Diagnosis not present

## 2018-12-20 DIAGNOSIS — E662 Morbid (severe) obesity with alveolar hypoventilation: Secondary | ICD-10-CM | POA: Diagnosis not present

## 2018-12-20 DIAGNOSIS — I10 Essential (primary) hypertension: Secondary | ICD-10-CM | POA: Diagnosis not present

## 2018-12-20 DIAGNOSIS — J45909 Unspecified asthma, uncomplicated: Secondary | ICD-10-CM | POA: Diagnosis not present

## 2018-12-21 DIAGNOSIS — J45909 Unspecified asthma, uncomplicated: Secondary | ICD-10-CM | POA: Diagnosis not present

## 2018-12-21 DIAGNOSIS — E662 Morbid (severe) obesity with alveolar hypoventilation: Secondary | ICD-10-CM | POA: Diagnosis not present

## 2018-12-21 DIAGNOSIS — I10 Essential (primary) hypertension: Secondary | ICD-10-CM | POA: Diagnosis not present

## 2018-12-22 DIAGNOSIS — E662 Morbid (severe) obesity with alveolar hypoventilation: Secondary | ICD-10-CM | POA: Diagnosis not present

## 2018-12-22 DIAGNOSIS — I10 Essential (primary) hypertension: Secondary | ICD-10-CM | POA: Diagnosis not present

## 2018-12-22 DIAGNOSIS — J45909 Unspecified asthma, uncomplicated: Secondary | ICD-10-CM | POA: Diagnosis not present

## 2018-12-23 DIAGNOSIS — I10 Essential (primary) hypertension: Secondary | ICD-10-CM | POA: Diagnosis not present

## 2018-12-23 DIAGNOSIS — J45909 Unspecified asthma, uncomplicated: Secondary | ICD-10-CM | POA: Diagnosis not present

## 2018-12-23 DIAGNOSIS — E662 Morbid (severe) obesity with alveolar hypoventilation: Secondary | ICD-10-CM | POA: Diagnosis not present

## 2018-12-24 DIAGNOSIS — J45909 Unspecified asthma, uncomplicated: Secondary | ICD-10-CM | POA: Diagnosis not present

## 2018-12-24 DIAGNOSIS — I10 Essential (primary) hypertension: Secondary | ICD-10-CM | POA: Diagnosis not present

## 2018-12-24 DIAGNOSIS — E662 Morbid (severe) obesity with alveolar hypoventilation: Secondary | ICD-10-CM | POA: Diagnosis not present

## 2018-12-25 DIAGNOSIS — E662 Morbid (severe) obesity with alveolar hypoventilation: Secondary | ICD-10-CM | POA: Diagnosis not present

## 2018-12-25 DIAGNOSIS — J45909 Unspecified asthma, uncomplicated: Secondary | ICD-10-CM | POA: Diagnosis not present

## 2018-12-25 DIAGNOSIS — I10 Essential (primary) hypertension: Secondary | ICD-10-CM | POA: Diagnosis not present

## 2018-12-26 DIAGNOSIS — I10 Essential (primary) hypertension: Secondary | ICD-10-CM | POA: Diagnosis not present

## 2018-12-26 DIAGNOSIS — E662 Morbid (severe) obesity with alveolar hypoventilation: Secondary | ICD-10-CM | POA: Diagnosis not present

## 2018-12-26 DIAGNOSIS — J45909 Unspecified asthma, uncomplicated: Secondary | ICD-10-CM | POA: Diagnosis not present

## 2018-12-27 DIAGNOSIS — J45909 Unspecified asthma, uncomplicated: Secondary | ICD-10-CM | POA: Diagnosis not present

## 2018-12-27 DIAGNOSIS — E662 Morbid (severe) obesity with alveolar hypoventilation: Secondary | ICD-10-CM | POA: Diagnosis not present

## 2018-12-27 DIAGNOSIS — I10 Essential (primary) hypertension: Secondary | ICD-10-CM | POA: Diagnosis not present

## 2018-12-28 DIAGNOSIS — I10 Essential (primary) hypertension: Secondary | ICD-10-CM | POA: Diagnosis not present

## 2018-12-28 DIAGNOSIS — E662 Morbid (severe) obesity with alveolar hypoventilation: Secondary | ICD-10-CM | POA: Diagnosis not present

## 2018-12-28 DIAGNOSIS — J45909 Unspecified asthma, uncomplicated: Secondary | ICD-10-CM | POA: Diagnosis not present

## 2018-12-29 DIAGNOSIS — I10 Essential (primary) hypertension: Secondary | ICD-10-CM | POA: Diagnosis not present

## 2018-12-29 DIAGNOSIS — E662 Morbid (severe) obesity with alveolar hypoventilation: Secondary | ICD-10-CM | POA: Diagnosis not present

## 2018-12-29 DIAGNOSIS — J45909 Unspecified asthma, uncomplicated: Secondary | ICD-10-CM | POA: Diagnosis not present

## 2018-12-30 DIAGNOSIS — I10 Essential (primary) hypertension: Secondary | ICD-10-CM | POA: Diagnosis not present

## 2018-12-30 DIAGNOSIS — J45909 Unspecified asthma, uncomplicated: Secondary | ICD-10-CM | POA: Diagnosis not present

## 2018-12-30 DIAGNOSIS — E662 Morbid (severe) obesity with alveolar hypoventilation: Secondary | ICD-10-CM | POA: Diagnosis not present

## 2018-12-31 DIAGNOSIS — J45909 Unspecified asthma, uncomplicated: Secondary | ICD-10-CM | POA: Diagnosis not present

## 2018-12-31 DIAGNOSIS — E662 Morbid (severe) obesity with alveolar hypoventilation: Secondary | ICD-10-CM | POA: Diagnosis not present

## 2018-12-31 DIAGNOSIS — I10 Essential (primary) hypertension: Secondary | ICD-10-CM | POA: Diagnosis not present

## 2019-01-01 DIAGNOSIS — I10 Essential (primary) hypertension: Secondary | ICD-10-CM | POA: Diagnosis not present

## 2019-01-01 DIAGNOSIS — J45909 Unspecified asthma, uncomplicated: Secondary | ICD-10-CM | POA: Diagnosis not present

## 2019-01-01 DIAGNOSIS — E662 Morbid (severe) obesity with alveolar hypoventilation: Secondary | ICD-10-CM | POA: Diagnosis not present

## 2019-01-02 DIAGNOSIS — J45909 Unspecified asthma, uncomplicated: Secondary | ICD-10-CM | POA: Diagnosis not present

## 2019-01-02 DIAGNOSIS — I10 Essential (primary) hypertension: Secondary | ICD-10-CM | POA: Diagnosis not present

## 2019-01-02 DIAGNOSIS — E662 Morbid (severe) obesity with alveolar hypoventilation: Secondary | ICD-10-CM | POA: Diagnosis not present

## 2019-01-03 DIAGNOSIS — J45909 Unspecified asthma, uncomplicated: Secondary | ICD-10-CM | POA: Diagnosis not present

## 2019-01-03 DIAGNOSIS — E662 Morbid (severe) obesity with alveolar hypoventilation: Secondary | ICD-10-CM | POA: Diagnosis not present

## 2019-01-03 DIAGNOSIS — I10 Essential (primary) hypertension: Secondary | ICD-10-CM | POA: Diagnosis not present

## 2019-01-04 DIAGNOSIS — E662 Morbid (severe) obesity with alveolar hypoventilation: Secondary | ICD-10-CM | POA: Diagnosis not present

## 2019-01-04 DIAGNOSIS — I10 Essential (primary) hypertension: Secondary | ICD-10-CM | POA: Diagnosis not present

## 2019-01-04 DIAGNOSIS — J45909 Unspecified asthma, uncomplicated: Secondary | ICD-10-CM | POA: Diagnosis not present

## 2019-01-05 DIAGNOSIS — J45909 Unspecified asthma, uncomplicated: Secondary | ICD-10-CM | POA: Diagnosis not present

## 2019-01-05 DIAGNOSIS — E662 Morbid (severe) obesity with alveolar hypoventilation: Secondary | ICD-10-CM | POA: Diagnosis not present

## 2019-01-05 DIAGNOSIS — I10 Essential (primary) hypertension: Secondary | ICD-10-CM | POA: Diagnosis not present

## 2019-01-06 DIAGNOSIS — I10 Essential (primary) hypertension: Secondary | ICD-10-CM | POA: Diagnosis not present

## 2019-01-06 DIAGNOSIS — J45909 Unspecified asthma, uncomplicated: Secondary | ICD-10-CM | POA: Diagnosis not present

## 2019-01-06 DIAGNOSIS — E662 Morbid (severe) obesity with alveolar hypoventilation: Secondary | ICD-10-CM | POA: Diagnosis not present

## 2019-01-07 DIAGNOSIS — E662 Morbid (severe) obesity with alveolar hypoventilation: Secondary | ICD-10-CM | POA: Diagnosis not present

## 2019-01-07 DIAGNOSIS — J45909 Unspecified asthma, uncomplicated: Secondary | ICD-10-CM | POA: Diagnosis not present

## 2019-01-07 DIAGNOSIS — I10 Essential (primary) hypertension: Secondary | ICD-10-CM | POA: Diagnosis not present

## 2019-01-08 DIAGNOSIS — J45909 Unspecified asthma, uncomplicated: Secondary | ICD-10-CM | POA: Diagnosis not present

## 2019-01-08 DIAGNOSIS — E662 Morbid (severe) obesity with alveolar hypoventilation: Secondary | ICD-10-CM | POA: Diagnosis not present

## 2019-01-08 DIAGNOSIS — I10 Essential (primary) hypertension: Secondary | ICD-10-CM | POA: Diagnosis not present

## 2019-01-09 DIAGNOSIS — J45909 Unspecified asthma, uncomplicated: Secondary | ICD-10-CM | POA: Diagnosis not present

## 2019-01-09 DIAGNOSIS — E662 Morbid (severe) obesity with alveolar hypoventilation: Secondary | ICD-10-CM | POA: Diagnosis not present

## 2019-01-09 DIAGNOSIS — I10 Essential (primary) hypertension: Secondary | ICD-10-CM | POA: Diagnosis not present

## 2019-01-10 DIAGNOSIS — I10 Essential (primary) hypertension: Secondary | ICD-10-CM | POA: Diagnosis not present

## 2019-01-10 DIAGNOSIS — E662 Morbid (severe) obesity with alveolar hypoventilation: Secondary | ICD-10-CM | POA: Diagnosis not present

## 2019-01-10 DIAGNOSIS — J45909 Unspecified asthma, uncomplicated: Secondary | ICD-10-CM | POA: Diagnosis not present

## 2019-01-11 DIAGNOSIS — J45909 Unspecified asthma, uncomplicated: Secondary | ICD-10-CM | POA: Diagnosis not present

## 2019-01-11 DIAGNOSIS — I10 Essential (primary) hypertension: Secondary | ICD-10-CM | POA: Diagnosis not present

## 2019-01-11 DIAGNOSIS — E662 Morbid (severe) obesity with alveolar hypoventilation: Secondary | ICD-10-CM | POA: Diagnosis not present

## 2019-01-12 DIAGNOSIS — N183 Chronic kidney disease, stage 3 (moderate): Secondary | ICD-10-CM | POA: Diagnosis not present

## 2019-01-12 DIAGNOSIS — E785 Hyperlipidemia, unspecified: Secondary | ICD-10-CM | POA: Diagnosis not present

## 2019-01-12 DIAGNOSIS — J45909 Unspecified asthma, uncomplicated: Secondary | ICD-10-CM | POA: Diagnosis not present

## 2019-01-12 DIAGNOSIS — E662 Morbid (severe) obesity with alveolar hypoventilation: Secondary | ICD-10-CM | POA: Diagnosis not present

## 2019-01-12 DIAGNOSIS — M25532 Pain in left wrist: Secondary | ICD-10-CM | POA: Diagnosis not present

## 2019-01-12 DIAGNOSIS — I10 Essential (primary) hypertension: Secondary | ICD-10-CM | POA: Diagnosis not present

## 2019-01-13 DIAGNOSIS — E662 Morbid (severe) obesity with alveolar hypoventilation: Secondary | ICD-10-CM | POA: Diagnosis not present

## 2019-01-13 DIAGNOSIS — I10 Essential (primary) hypertension: Secondary | ICD-10-CM | POA: Diagnosis not present

## 2019-01-13 DIAGNOSIS — J45909 Unspecified asthma, uncomplicated: Secondary | ICD-10-CM | POA: Diagnosis not present

## 2019-01-14 DIAGNOSIS — E662 Morbid (severe) obesity with alveolar hypoventilation: Secondary | ICD-10-CM | POA: Diagnosis not present

## 2019-01-14 DIAGNOSIS — J45909 Unspecified asthma, uncomplicated: Secondary | ICD-10-CM | POA: Diagnosis not present

## 2019-01-14 DIAGNOSIS — I10 Essential (primary) hypertension: Secondary | ICD-10-CM | POA: Diagnosis not present

## 2019-01-15 DIAGNOSIS — J45909 Unspecified asthma, uncomplicated: Secondary | ICD-10-CM | POA: Diagnosis not present

## 2019-01-15 DIAGNOSIS — I10 Essential (primary) hypertension: Secondary | ICD-10-CM | POA: Diagnosis not present

## 2019-01-15 DIAGNOSIS — E662 Morbid (severe) obesity with alveolar hypoventilation: Secondary | ICD-10-CM | POA: Diagnosis not present

## 2019-01-16 DIAGNOSIS — I10 Essential (primary) hypertension: Secondary | ICD-10-CM | POA: Diagnosis not present

## 2019-01-16 DIAGNOSIS — E662 Morbid (severe) obesity with alveolar hypoventilation: Secondary | ICD-10-CM | POA: Diagnosis not present

## 2019-01-16 DIAGNOSIS — J45909 Unspecified asthma, uncomplicated: Secondary | ICD-10-CM | POA: Diagnosis not present

## 2019-01-17 DIAGNOSIS — I10 Essential (primary) hypertension: Secondary | ICD-10-CM | POA: Diagnosis not present

## 2019-01-17 DIAGNOSIS — E662 Morbid (severe) obesity with alveolar hypoventilation: Secondary | ICD-10-CM | POA: Diagnosis not present

## 2019-01-17 DIAGNOSIS — J45909 Unspecified asthma, uncomplicated: Secondary | ICD-10-CM | POA: Diagnosis not present

## 2019-01-18 DIAGNOSIS — I10 Essential (primary) hypertension: Secondary | ICD-10-CM | POA: Diagnosis not present

## 2019-01-18 DIAGNOSIS — E662 Morbid (severe) obesity with alveolar hypoventilation: Secondary | ICD-10-CM | POA: Diagnosis not present

## 2019-01-18 DIAGNOSIS — J45909 Unspecified asthma, uncomplicated: Secondary | ICD-10-CM | POA: Diagnosis not present

## 2019-01-19 DIAGNOSIS — I10 Essential (primary) hypertension: Secondary | ICD-10-CM | POA: Diagnosis not present

## 2019-01-19 DIAGNOSIS — J45909 Unspecified asthma, uncomplicated: Secondary | ICD-10-CM | POA: Diagnosis not present

## 2019-01-19 DIAGNOSIS — E662 Morbid (severe) obesity with alveolar hypoventilation: Secondary | ICD-10-CM | POA: Diagnosis not present

## 2019-01-20 DIAGNOSIS — I10 Essential (primary) hypertension: Secondary | ICD-10-CM | POA: Diagnosis not present

## 2019-01-20 DIAGNOSIS — J45909 Unspecified asthma, uncomplicated: Secondary | ICD-10-CM | POA: Diagnosis not present

## 2019-01-20 DIAGNOSIS — E662 Morbid (severe) obesity with alveolar hypoventilation: Secondary | ICD-10-CM | POA: Diagnosis not present

## 2019-01-21 DIAGNOSIS — I10 Essential (primary) hypertension: Secondary | ICD-10-CM | POA: Diagnosis not present

## 2019-01-21 DIAGNOSIS — J45909 Unspecified asthma, uncomplicated: Secondary | ICD-10-CM | POA: Diagnosis not present

## 2019-01-21 DIAGNOSIS — E662 Morbid (severe) obesity with alveolar hypoventilation: Secondary | ICD-10-CM | POA: Diagnosis not present

## 2019-01-22 DIAGNOSIS — E662 Morbid (severe) obesity with alveolar hypoventilation: Secondary | ICD-10-CM | POA: Diagnosis not present

## 2019-01-22 DIAGNOSIS — J45909 Unspecified asthma, uncomplicated: Secondary | ICD-10-CM | POA: Diagnosis not present

## 2019-01-22 DIAGNOSIS — I10 Essential (primary) hypertension: Secondary | ICD-10-CM | POA: Diagnosis not present

## 2019-01-23 DIAGNOSIS — J45909 Unspecified asthma, uncomplicated: Secondary | ICD-10-CM | POA: Diagnosis not present

## 2019-01-23 DIAGNOSIS — E662 Morbid (severe) obesity with alveolar hypoventilation: Secondary | ICD-10-CM | POA: Diagnosis not present

## 2019-01-23 DIAGNOSIS — I10 Essential (primary) hypertension: Secondary | ICD-10-CM | POA: Diagnosis not present

## 2019-01-24 DIAGNOSIS — I10 Essential (primary) hypertension: Secondary | ICD-10-CM | POA: Diagnosis not present

## 2019-01-24 DIAGNOSIS — J45909 Unspecified asthma, uncomplicated: Secondary | ICD-10-CM | POA: Diagnosis not present

## 2019-01-24 DIAGNOSIS — E662 Morbid (severe) obesity with alveolar hypoventilation: Secondary | ICD-10-CM | POA: Diagnosis not present

## 2019-01-25 DIAGNOSIS — J45909 Unspecified asthma, uncomplicated: Secondary | ICD-10-CM | POA: Diagnosis not present

## 2019-01-25 DIAGNOSIS — I10 Essential (primary) hypertension: Secondary | ICD-10-CM | POA: Diagnosis not present

## 2019-01-25 DIAGNOSIS — E662 Morbid (severe) obesity with alveolar hypoventilation: Secondary | ICD-10-CM | POA: Diagnosis not present

## 2019-01-26 DIAGNOSIS — I10 Essential (primary) hypertension: Secondary | ICD-10-CM | POA: Diagnosis not present

## 2019-01-26 DIAGNOSIS — J45909 Unspecified asthma, uncomplicated: Secondary | ICD-10-CM | POA: Diagnosis not present

## 2019-01-26 DIAGNOSIS — E662 Morbid (severe) obesity with alveolar hypoventilation: Secondary | ICD-10-CM | POA: Diagnosis not present

## 2019-01-27 DIAGNOSIS — J45909 Unspecified asthma, uncomplicated: Secondary | ICD-10-CM | POA: Diagnosis not present

## 2019-01-27 DIAGNOSIS — E662 Morbid (severe) obesity with alveolar hypoventilation: Secondary | ICD-10-CM | POA: Diagnosis not present

## 2019-01-27 DIAGNOSIS — I10 Essential (primary) hypertension: Secondary | ICD-10-CM | POA: Diagnosis not present

## 2019-01-28 DIAGNOSIS — J45909 Unspecified asthma, uncomplicated: Secondary | ICD-10-CM | POA: Diagnosis not present

## 2019-01-28 DIAGNOSIS — I10 Essential (primary) hypertension: Secondary | ICD-10-CM | POA: Diagnosis not present

## 2019-01-28 DIAGNOSIS — E662 Morbid (severe) obesity with alveolar hypoventilation: Secondary | ICD-10-CM | POA: Diagnosis not present

## 2019-01-29 DIAGNOSIS — I10 Essential (primary) hypertension: Secondary | ICD-10-CM | POA: Diagnosis not present

## 2019-01-29 DIAGNOSIS — E662 Morbid (severe) obesity with alveolar hypoventilation: Secondary | ICD-10-CM | POA: Diagnosis not present

## 2019-01-29 DIAGNOSIS — J45909 Unspecified asthma, uncomplicated: Secondary | ICD-10-CM | POA: Diagnosis not present

## 2019-01-30 DIAGNOSIS — J45909 Unspecified asthma, uncomplicated: Secondary | ICD-10-CM | POA: Diagnosis not present

## 2019-01-30 DIAGNOSIS — E662 Morbid (severe) obesity with alveolar hypoventilation: Secondary | ICD-10-CM | POA: Diagnosis not present

## 2019-01-30 DIAGNOSIS — I10 Essential (primary) hypertension: Secondary | ICD-10-CM | POA: Diagnosis not present

## 2019-01-31 DIAGNOSIS — I10 Essential (primary) hypertension: Secondary | ICD-10-CM | POA: Diagnosis not present

## 2019-01-31 DIAGNOSIS — J45909 Unspecified asthma, uncomplicated: Secondary | ICD-10-CM | POA: Diagnosis not present

## 2019-01-31 DIAGNOSIS — E662 Morbid (severe) obesity with alveolar hypoventilation: Secondary | ICD-10-CM | POA: Diagnosis not present

## 2019-02-01 DIAGNOSIS — I10 Essential (primary) hypertension: Secondary | ICD-10-CM | POA: Diagnosis not present

## 2019-02-01 DIAGNOSIS — E662 Morbid (severe) obesity with alveolar hypoventilation: Secondary | ICD-10-CM | POA: Diagnosis not present

## 2019-02-01 DIAGNOSIS — J45909 Unspecified asthma, uncomplicated: Secondary | ICD-10-CM | POA: Diagnosis not present

## 2019-02-02 DIAGNOSIS — I10 Essential (primary) hypertension: Secondary | ICD-10-CM | POA: Diagnosis not present

## 2019-02-02 DIAGNOSIS — E662 Morbid (severe) obesity with alveolar hypoventilation: Secondary | ICD-10-CM | POA: Diagnosis not present

## 2019-02-02 DIAGNOSIS — J45909 Unspecified asthma, uncomplicated: Secondary | ICD-10-CM | POA: Diagnosis not present

## 2019-02-03 DIAGNOSIS — E662 Morbid (severe) obesity with alveolar hypoventilation: Secondary | ICD-10-CM | POA: Diagnosis not present

## 2019-02-03 DIAGNOSIS — I10 Essential (primary) hypertension: Secondary | ICD-10-CM | POA: Diagnosis not present

## 2019-02-03 DIAGNOSIS — J45909 Unspecified asthma, uncomplicated: Secondary | ICD-10-CM | POA: Diagnosis not present

## 2019-02-04 DIAGNOSIS — I10 Essential (primary) hypertension: Secondary | ICD-10-CM | POA: Diagnosis not present

## 2019-02-04 DIAGNOSIS — J45909 Unspecified asthma, uncomplicated: Secondary | ICD-10-CM | POA: Diagnosis not present

## 2019-02-04 DIAGNOSIS — E662 Morbid (severe) obesity with alveolar hypoventilation: Secondary | ICD-10-CM | POA: Diagnosis not present

## 2019-02-05 DIAGNOSIS — I10 Essential (primary) hypertension: Secondary | ICD-10-CM | POA: Diagnosis not present

## 2019-02-05 DIAGNOSIS — E662 Morbid (severe) obesity with alveolar hypoventilation: Secondary | ICD-10-CM | POA: Diagnosis not present

## 2019-02-05 DIAGNOSIS — J45909 Unspecified asthma, uncomplicated: Secondary | ICD-10-CM | POA: Diagnosis not present

## 2019-02-06 ENCOUNTER — Other Ambulatory Visit: Payer: Self-pay

## 2019-02-06 DIAGNOSIS — Z20822 Contact with and (suspected) exposure to covid-19: Secondary | ICD-10-CM

## 2019-02-06 DIAGNOSIS — E662 Morbid (severe) obesity with alveolar hypoventilation: Secondary | ICD-10-CM | POA: Diagnosis not present

## 2019-02-06 DIAGNOSIS — J45909 Unspecified asthma, uncomplicated: Secondary | ICD-10-CM | POA: Diagnosis not present

## 2019-02-06 DIAGNOSIS — I10 Essential (primary) hypertension: Secondary | ICD-10-CM | POA: Diagnosis not present

## 2019-02-07 DIAGNOSIS — I10 Essential (primary) hypertension: Secondary | ICD-10-CM | POA: Diagnosis not present

## 2019-02-07 DIAGNOSIS — E662 Morbid (severe) obesity with alveolar hypoventilation: Secondary | ICD-10-CM | POA: Diagnosis not present

## 2019-02-07 DIAGNOSIS — J45909 Unspecified asthma, uncomplicated: Secondary | ICD-10-CM | POA: Diagnosis not present

## 2019-02-07 LAB — NOVEL CORONAVIRUS, NAA: SARS-CoV-2, NAA: NOT DETECTED

## 2019-02-08 DIAGNOSIS — I10 Essential (primary) hypertension: Secondary | ICD-10-CM | POA: Diagnosis not present

## 2019-02-08 DIAGNOSIS — E662 Morbid (severe) obesity with alveolar hypoventilation: Secondary | ICD-10-CM | POA: Diagnosis not present

## 2019-02-08 DIAGNOSIS — J45909 Unspecified asthma, uncomplicated: Secondary | ICD-10-CM | POA: Diagnosis not present

## 2019-02-09 DIAGNOSIS — I10 Essential (primary) hypertension: Secondary | ICD-10-CM | POA: Diagnosis not present

## 2019-02-09 DIAGNOSIS — J45909 Unspecified asthma, uncomplicated: Secondary | ICD-10-CM | POA: Diagnosis not present

## 2019-02-09 DIAGNOSIS — E662 Morbid (severe) obesity with alveolar hypoventilation: Secondary | ICD-10-CM | POA: Diagnosis not present

## 2019-02-10 DIAGNOSIS — J45909 Unspecified asthma, uncomplicated: Secondary | ICD-10-CM | POA: Diagnosis not present

## 2019-02-10 DIAGNOSIS — I10 Essential (primary) hypertension: Secondary | ICD-10-CM | POA: Diagnosis not present

## 2019-02-10 DIAGNOSIS — E662 Morbid (severe) obesity with alveolar hypoventilation: Secondary | ICD-10-CM | POA: Diagnosis not present

## 2019-02-11 DIAGNOSIS — J45909 Unspecified asthma, uncomplicated: Secondary | ICD-10-CM | POA: Diagnosis not present

## 2019-02-11 DIAGNOSIS — I10 Essential (primary) hypertension: Secondary | ICD-10-CM | POA: Diagnosis not present

## 2019-02-11 DIAGNOSIS — E662 Morbid (severe) obesity with alveolar hypoventilation: Secondary | ICD-10-CM | POA: Diagnosis not present

## 2019-02-12 DIAGNOSIS — E662 Morbid (severe) obesity with alveolar hypoventilation: Secondary | ICD-10-CM | POA: Diagnosis not present

## 2019-02-12 DIAGNOSIS — I10 Essential (primary) hypertension: Secondary | ICD-10-CM | POA: Diagnosis not present

## 2019-02-12 DIAGNOSIS — J45909 Unspecified asthma, uncomplicated: Secondary | ICD-10-CM | POA: Diagnosis not present

## 2019-02-13 DIAGNOSIS — E662 Morbid (severe) obesity with alveolar hypoventilation: Secondary | ICD-10-CM | POA: Diagnosis not present

## 2019-02-13 DIAGNOSIS — J45909 Unspecified asthma, uncomplicated: Secondary | ICD-10-CM | POA: Diagnosis not present

## 2019-02-13 DIAGNOSIS — I10 Essential (primary) hypertension: Secondary | ICD-10-CM | POA: Diagnosis not present

## 2019-02-14 DIAGNOSIS — I10 Essential (primary) hypertension: Secondary | ICD-10-CM | POA: Diagnosis not present

## 2019-02-14 DIAGNOSIS — E662 Morbid (severe) obesity with alveolar hypoventilation: Secondary | ICD-10-CM | POA: Diagnosis not present

## 2019-02-14 DIAGNOSIS — J45909 Unspecified asthma, uncomplicated: Secondary | ICD-10-CM | POA: Diagnosis not present

## 2019-02-15 DIAGNOSIS — J45909 Unspecified asthma, uncomplicated: Secondary | ICD-10-CM | POA: Diagnosis not present

## 2019-02-15 DIAGNOSIS — I10 Essential (primary) hypertension: Secondary | ICD-10-CM | POA: Diagnosis not present

## 2019-02-15 DIAGNOSIS — E662 Morbid (severe) obesity with alveolar hypoventilation: Secondary | ICD-10-CM | POA: Diagnosis not present

## 2019-02-16 DIAGNOSIS — I10 Essential (primary) hypertension: Secondary | ICD-10-CM | POA: Diagnosis not present

## 2019-02-16 DIAGNOSIS — J45909 Unspecified asthma, uncomplicated: Secondary | ICD-10-CM | POA: Diagnosis not present

## 2019-02-16 DIAGNOSIS — E662 Morbid (severe) obesity with alveolar hypoventilation: Secondary | ICD-10-CM | POA: Diagnosis not present

## 2019-02-17 DIAGNOSIS — E662 Morbid (severe) obesity with alveolar hypoventilation: Secondary | ICD-10-CM | POA: Diagnosis not present

## 2019-02-17 DIAGNOSIS — J45909 Unspecified asthma, uncomplicated: Secondary | ICD-10-CM | POA: Diagnosis not present

## 2019-02-17 DIAGNOSIS — I10 Essential (primary) hypertension: Secondary | ICD-10-CM | POA: Diagnosis not present

## 2019-02-18 DIAGNOSIS — I10 Essential (primary) hypertension: Secondary | ICD-10-CM | POA: Diagnosis not present

## 2019-02-18 DIAGNOSIS — J45909 Unspecified asthma, uncomplicated: Secondary | ICD-10-CM | POA: Diagnosis not present

## 2019-02-18 DIAGNOSIS — E662 Morbid (severe) obesity with alveolar hypoventilation: Secondary | ICD-10-CM | POA: Diagnosis not present

## 2019-02-19 DIAGNOSIS — E662 Morbid (severe) obesity with alveolar hypoventilation: Secondary | ICD-10-CM | POA: Diagnosis not present

## 2019-02-19 DIAGNOSIS — I10 Essential (primary) hypertension: Secondary | ICD-10-CM | POA: Diagnosis not present

## 2019-02-19 DIAGNOSIS — J45909 Unspecified asthma, uncomplicated: Secondary | ICD-10-CM | POA: Diagnosis not present

## 2019-02-20 DIAGNOSIS — J45909 Unspecified asthma, uncomplicated: Secondary | ICD-10-CM | POA: Diagnosis not present

## 2019-02-20 DIAGNOSIS — I10 Essential (primary) hypertension: Secondary | ICD-10-CM | POA: Diagnosis not present

## 2019-02-20 DIAGNOSIS — E662 Morbid (severe) obesity with alveolar hypoventilation: Secondary | ICD-10-CM | POA: Diagnosis not present

## 2019-02-21 DIAGNOSIS — E662 Morbid (severe) obesity with alveolar hypoventilation: Secondary | ICD-10-CM | POA: Diagnosis not present

## 2019-02-21 DIAGNOSIS — I10 Essential (primary) hypertension: Secondary | ICD-10-CM | POA: Diagnosis not present

## 2019-02-21 DIAGNOSIS — J45909 Unspecified asthma, uncomplicated: Secondary | ICD-10-CM | POA: Diagnosis not present

## 2019-02-22 DIAGNOSIS — I10 Essential (primary) hypertension: Secondary | ICD-10-CM | POA: Diagnosis not present

## 2019-02-22 DIAGNOSIS — E662 Morbid (severe) obesity with alveolar hypoventilation: Secondary | ICD-10-CM | POA: Diagnosis not present

## 2019-02-22 DIAGNOSIS — J45909 Unspecified asthma, uncomplicated: Secondary | ICD-10-CM | POA: Diagnosis not present

## 2019-02-23 DIAGNOSIS — I10 Essential (primary) hypertension: Secondary | ICD-10-CM | POA: Diagnosis not present

## 2019-02-23 DIAGNOSIS — J45909 Unspecified asthma, uncomplicated: Secondary | ICD-10-CM | POA: Diagnosis not present

## 2019-02-23 DIAGNOSIS — E662 Morbid (severe) obesity with alveolar hypoventilation: Secondary | ICD-10-CM | POA: Diagnosis not present

## 2019-02-24 DIAGNOSIS — J45909 Unspecified asthma, uncomplicated: Secondary | ICD-10-CM | POA: Diagnosis not present

## 2019-02-24 DIAGNOSIS — I10 Essential (primary) hypertension: Secondary | ICD-10-CM | POA: Diagnosis not present

## 2019-02-24 DIAGNOSIS — E662 Morbid (severe) obesity with alveolar hypoventilation: Secondary | ICD-10-CM | POA: Diagnosis not present

## 2019-02-25 DIAGNOSIS — I10 Essential (primary) hypertension: Secondary | ICD-10-CM | POA: Diagnosis not present

## 2019-02-25 DIAGNOSIS — E662 Morbid (severe) obesity with alveolar hypoventilation: Secondary | ICD-10-CM | POA: Diagnosis not present

## 2019-02-25 DIAGNOSIS — J45909 Unspecified asthma, uncomplicated: Secondary | ICD-10-CM | POA: Diagnosis not present

## 2019-02-26 DIAGNOSIS — E662 Morbid (severe) obesity with alveolar hypoventilation: Secondary | ICD-10-CM | POA: Diagnosis not present

## 2019-02-26 DIAGNOSIS — J45909 Unspecified asthma, uncomplicated: Secondary | ICD-10-CM | POA: Diagnosis not present

## 2019-02-26 DIAGNOSIS — I10 Essential (primary) hypertension: Secondary | ICD-10-CM | POA: Diagnosis not present

## 2019-02-27 DIAGNOSIS — E662 Morbid (severe) obesity with alveolar hypoventilation: Secondary | ICD-10-CM | POA: Diagnosis not present

## 2019-02-27 DIAGNOSIS — J45909 Unspecified asthma, uncomplicated: Secondary | ICD-10-CM | POA: Diagnosis not present

## 2019-02-27 DIAGNOSIS — I10 Essential (primary) hypertension: Secondary | ICD-10-CM | POA: Diagnosis not present

## 2019-02-28 DIAGNOSIS — I10 Essential (primary) hypertension: Secondary | ICD-10-CM | POA: Diagnosis not present

## 2019-02-28 DIAGNOSIS — J45909 Unspecified asthma, uncomplicated: Secondary | ICD-10-CM | POA: Diagnosis not present

## 2019-02-28 DIAGNOSIS — E662 Morbid (severe) obesity with alveolar hypoventilation: Secondary | ICD-10-CM | POA: Diagnosis not present

## 2019-03-01 DIAGNOSIS — I10 Essential (primary) hypertension: Secondary | ICD-10-CM | POA: Diagnosis not present

## 2019-03-01 DIAGNOSIS — E662 Morbid (severe) obesity with alveolar hypoventilation: Secondary | ICD-10-CM | POA: Diagnosis not present

## 2019-03-01 DIAGNOSIS — J45909 Unspecified asthma, uncomplicated: Secondary | ICD-10-CM | POA: Diagnosis not present

## 2019-03-02 DIAGNOSIS — E662 Morbid (severe) obesity with alveolar hypoventilation: Secondary | ICD-10-CM | POA: Diagnosis not present

## 2019-03-02 DIAGNOSIS — I10 Essential (primary) hypertension: Secondary | ICD-10-CM | POA: Diagnosis not present

## 2019-03-02 DIAGNOSIS — J45909 Unspecified asthma, uncomplicated: Secondary | ICD-10-CM | POA: Diagnosis not present

## 2019-03-03 DIAGNOSIS — J45909 Unspecified asthma, uncomplicated: Secondary | ICD-10-CM | POA: Diagnosis not present

## 2019-03-03 DIAGNOSIS — I10 Essential (primary) hypertension: Secondary | ICD-10-CM | POA: Diagnosis not present

## 2019-03-03 DIAGNOSIS — E662 Morbid (severe) obesity with alveolar hypoventilation: Secondary | ICD-10-CM | POA: Diagnosis not present

## 2019-03-04 DIAGNOSIS — J45909 Unspecified asthma, uncomplicated: Secondary | ICD-10-CM | POA: Diagnosis not present

## 2019-03-04 DIAGNOSIS — E662 Morbid (severe) obesity with alveolar hypoventilation: Secondary | ICD-10-CM | POA: Diagnosis not present

## 2019-03-04 DIAGNOSIS — I10 Essential (primary) hypertension: Secondary | ICD-10-CM | POA: Diagnosis not present

## 2019-03-05 DIAGNOSIS — I10 Essential (primary) hypertension: Secondary | ICD-10-CM | POA: Diagnosis not present

## 2019-03-05 DIAGNOSIS — J45909 Unspecified asthma, uncomplicated: Secondary | ICD-10-CM | POA: Diagnosis not present

## 2019-03-05 DIAGNOSIS — E662 Morbid (severe) obesity with alveolar hypoventilation: Secondary | ICD-10-CM | POA: Diagnosis not present

## 2019-03-06 DIAGNOSIS — E662 Morbid (severe) obesity with alveolar hypoventilation: Secondary | ICD-10-CM | POA: Diagnosis not present

## 2019-03-06 DIAGNOSIS — J45909 Unspecified asthma, uncomplicated: Secondary | ICD-10-CM | POA: Diagnosis not present

## 2019-03-06 DIAGNOSIS — I10 Essential (primary) hypertension: Secondary | ICD-10-CM | POA: Diagnosis not present

## 2019-03-07 DIAGNOSIS — N183 Chronic kidney disease, stage 3 unspecified: Secondary | ICD-10-CM | POA: Diagnosis not present

## 2019-03-07 DIAGNOSIS — E662 Morbid (severe) obesity with alveolar hypoventilation: Secondary | ICD-10-CM | POA: Diagnosis not present

## 2019-03-07 DIAGNOSIS — Z23 Encounter for immunization: Secondary | ICD-10-CM | POA: Diagnosis not present

## 2019-03-07 DIAGNOSIS — I10 Essential (primary) hypertension: Secondary | ICD-10-CM | POA: Diagnosis not present

## 2019-03-07 DIAGNOSIS — E785 Hyperlipidemia, unspecified: Secondary | ICD-10-CM | POA: Diagnosis not present

## 2019-03-07 DIAGNOSIS — E559 Vitamin D deficiency, unspecified: Secondary | ICD-10-CM | POA: Diagnosis not present

## 2019-03-07 DIAGNOSIS — J45909 Unspecified asthma, uncomplicated: Secondary | ICD-10-CM | POA: Diagnosis not present

## 2019-03-08 DIAGNOSIS — J45909 Unspecified asthma, uncomplicated: Secondary | ICD-10-CM | POA: Diagnosis not present

## 2019-03-08 DIAGNOSIS — I10 Essential (primary) hypertension: Secondary | ICD-10-CM | POA: Diagnosis not present

## 2019-03-08 DIAGNOSIS — E662 Morbid (severe) obesity with alveolar hypoventilation: Secondary | ICD-10-CM | POA: Diagnosis not present

## 2019-03-09 DIAGNOSIS — I10 Essential (primary) hypertension: Secondary | ICD-10-CM | POA: Diagnosis not present

## 2019-03-09 DIAGNOSIS — J45909 Unspecified asthma, uncomplicated: Secondary | ICD-10-CM | POA: Diagnosis not present

## 2019-03-09 DIAGNOSIS — E662 Morbid (severe) obesity with alveolar hypoventilation: Secondary | ICD-10-CM | POA: Diagnosis not present

## 2019-03-10 DIAGNOSIS — J45909 Unspecified asthma, uncomplicated: Secondary | ICD-10-CM | POA: Diagnosis not present

## 2019-03-10 DIAGNOSIS — I10 Essential (primary) hypertension: Secondary | ICD-10-CM | POA: Diagnosis not present

## 2019-03-10 DIAGNOSIS — E662 Morbid (severe) obesity with alveolar hypoventilation: Secondary | ICD-10-CM | POA: Diagnosis not present

## 2019-03-11 DIAGNOSIS — J45909 Unspecified asthma, uncomplicated: Secondary | ICD-10-CM | POA: Diagnosis not present

## 2019-03-11 DIAGNOSIS — I10 Essential (primary) hypertension: Secondary | ICD-10-CM | POA: Diagnosis not present

## 2019-03-11 DIAGNOSIS — E662 Morbid (severe) obesity with alveolar hypoventilation: Secondary | ICD-10-CM | POA: Diagnosis not present

## 2019-04-02 DIAGNOSIS — E662 Morbid (severe) obesity with alveolar hypoventilation: Secondary | ICD-10-CM | POA: Diagnosis not present

## 2019-04-02 DIAGNOSIS — J45909 Unspecified asthma, uncomplicated: Secondary | ICD-10-CM | POA: Diagnosis not present

## 2019-04-02 DIAGNOSIS — N183 Chronic kidney disease, stage 3 unspecified: Secondary | ICD-10-CM | POA: Diagnosis not present

## 2019-04-02 DIAGNOSIS — E559 Vitamin D deficiency, unspecified: Secondary | ICD-10-CM | POA: Diagnosis not present

## 2019-04-02 DIAGNOSIS — I10 Essential (primary) hypertension: Secondary | ICD-10-CM | POA: Diagnosis not present

## 2019-04-02 DIAGNOSIS — E785 Hyperlipidemia, unspecified: Secondary | ICD-10-CM | POA: Diagnosis not present

## 2019-04-03 DIAGNOSIS — I10 Essential (primary) hypertension: Secondary | ICD-10-CM | POA: Diagnosis not present

## 2019-04-03 DIAGNOSIS — E662 Morbid (severe) obesity with alveolar hypoventilation: Secondary | ICD-10-CM | POA: Diagnosis not present

## 2019-04-03 DIAGNOSIS — J45909 Unspecified asthma, uncomplicated: Secondary | ICD-10-CM | POA: Diagnosis not present

## 2019-04-04 DIAGNOSIS — E662 Morbid (severe) obesity with alveolar hypoventilation: Secondary | ICD-10-CM | POA: Diagnosis not present

## 2019-04-04 DIAGNOSIS — J45909 Unspecified asthma, uncomplicated: Secondary | ICD-10-CM | POA: Diagnosis not present

## 2019-04-04 DIAGNOSIS — I10 Essential (primary) hypertension: Secondary | ICD-10-CM | POA: Diagnosis not present

## 2019-04-05 DIAGNOSIS — E662 Morbid (severe) obesity with alveolar hypoventilation: Secondary | ICD-10-CM | POA: Diagnosis not present

## 2019-04-05 DIAGNOSIS — J45909 Unspecified asthma, uncomplicated: Secondary | ICD-10-CM | POA: Diagnosis not present

## 2019-04-05 DIAGNOSIS — I10 Essential (primary) hypertension: Secondary | ICD-10-CM | POA: Diagnosis not present

## 2019-04-06 DIAGNOSIS — I10 Essential (primary) hypertension: Secondary | ICD-10-CM | POA: Diagnosis not present

## 2019-04-06 DIAGNOSIS — E662 Morbid (severe) obesity with alveolar hypoventilation: Secondary | ICD-10-CM | POA: Diagnosis not present

## 2019-04-06 DIAGNOSIS — J45909 Unspecified asthma, uncomplicated: Secondary | ICD-10-CM | POA: Diagnosis not present

## 2019-04-07 DIAGNOSIS — I10 Essential (primary) hypertension: Secondary | ICD-10-CM | POA: Diagnosis not present

## 2019-04-07 DIAGNOSIS — J45909 Unspecified asthma, uncomplicated: Secondary | ICD-10-CM | POA: Diagnosis not present

## 2019-04-07 DIAGNOSIS — E662 Morbid (severe) obesity with alveolar hypoventilation: Secondary | ICD-10-CM | POA: Diagnosis not present

## 2019-04-08 DIAGNOSIS — I10 Essential (primary) hypertension: Secondary | ICD-10-CM | POA: Diagnosis not present

## 2019-04-08 DIAGNOSIS — J45909 Unspecified asthma, uncomplicated: Secondary | ICD-10-CM | POA: Diagnosis not present

## 2019-04-08 DIAGNOSIS — E662 Morbid (severe) obesity with alveolar hypoventilation: Secondary | ICD-10-CM | POA: Diagnosis not present

## 2019-07-13 ENCOUNTER — Ambulatory Visit: Payer: Medicaid Other | Attending: Internal Medicine

## 2019-07-13 DIAGNOSIS — Z23 Encounter for immunization: Secondary | ICD-10-CM

## 2019-07-13 NOTE — Progress Notes (Signed)
   Covid-19 Vaccination Clinic  Name:  TATIANNA BOTTOMLEY    MRN: [id number] DOB: 04-23-1955  07/13/2019  Ms. Lardie was observed post Covid-19 immunization for 15 minutes without incident. She was provided with Vaccine Information Sheet and instruction to access the V-Safe system.   Ms. Kubina was instructed to call 911 with any severe reactions post vaccine: Marland Kitchen Difficulty breathing  . Swelling of face and throat  . A fast heartbeat  . A bad rash all over body  . Dizziness and weakness   Immunizations Administered    Name Date Dose VIS Date Route   Pfizer COVID-19 Vaccine 07/13/2019  1:28 PM 0.3 mL 03/30/2019 Intramuscular   Manufacturer: [place]   Lot: [id number]   [place]: [id number]

## 2019-08-08 ENCOUNTER — Ambulatory Visit: Payer: Medicaid Other | Attending: Internal Medicine

## 2019-08-08 DIAGNOSIS — Z23 Encounter for immunization: Secondary | ICD-10-CM

## 2019-08-08 NOTE — Progress Notes (Signed)
   Covid-19 Vaccination Clinic  Name:  RONALD MODGLIN    MRN: [id number] DOB: 1955/09/06  08/08/2019  Ms. Howitt was observed post Covid-19 immunization for 15 minutes without incident. She was provided with Vaccine Information Sheet and instruction to access the V-Safe system.   Ms. Ruthven was instructed to call 911 with any severe reactions post vaccine: Marland Kitchen Difficulty breathing  . Swelling of face and throat  . A fast heartbeat  . A bad rash all over body  . Dizziness and weakness   Immunizations Administered    Name Date Dose VIS Date Route   Pfizer COVID-19 Vaccine 08/08/2019  9:58 AM 0.3 mL 06/13/2018 Intramuscular   Manufacturer: [place]   Lot: [id number]   [place]: [id number]

## 2020-01-01 DIAGNOSIS — E559 Vitamin D deficiency, unspecified: Secondary | ICD-10-CM | POA: Diagnosis not present

## 2020-01-01 DIAGNOSIS — I10 Essential (primary) hypertension: Secondary | ICD-10-CM | POA: Diagnosis not present

## 2020-01-01 DIAGNOSIS — E785 Hyperlipidemia, unspecified: Secondary | ICD-10-CM | POA: Diagnosis not present

## 2020-02-20 DIAGNOSIS — I10 Essential (primary) hypertension: Secondary | ICD-10-CM | POA: Diagnosis not present

## 2020-02-20 DIAGNOSIS — N183 Chronic kidney disease, stage 3 unspecified: Secondary | ICD-10-CM | POA: Diagnosis not present

## 2020-02-20 DIAGNOSIS — E559 Vitamin D deficiency, unspecified: Secondary | ICD-10-CM | POA: Diagnosis not present

## 2020-02-20 DIAGNOSIS — E785 Hyperlipidemia, unspecified: Secondary | ICD-10-CM | POA: Diagnosis not present

## 2020-02-20 DIAGNOSIS — E669 Obesity, unspecified: Secondary | ICD-10-CM | POA: Diagnosis not present

## 2020-02-20 DIAGNOSIS — Z23 Encounter for immunization: Secondary | ICD-10-CM | POA: Diagnosis not present

## 2020-02-20 DIAGNOSIS — Z131 Encounter for screening for diabetes mellitus: Secondary | ICD-10-CM | POA: Diagnosis not present

## 2020-02-25 ENCOUNTER — Other Ambulatory Visit: Payer: Self-pay | Admitting: Family Medicine

## 2020-02-25 DIAGNOSIS — Z1231 Encounter for screening mammogram for malignant neoplasm of breast: Secondary | ICD-10-CM

## 2020-03-27 DIAGNOSIS — I10 Essential (primary) hypertension: Secondary | ICD-10-CM | POA: Diagnosis not present

## 2020-03-27 DIAGNOSIS — Z9889 Other specified postprocedural states: Secondary | ICD-10-CM | POA: Diagnosis not present

## 2020-03-27 DIAGNOSIS — N183 Chronic kidney disease, stage 3 unspecified: Secondary | ICD-10-CM | POA: Diagnosis not present

## 2020-03-27 DIAGNOSIS — E669 Obesity, unspecified: Secondary | ICD-10-CM | POA: Diagnosis not present

## 2020-04-07 ENCOUNTER — Ambulatory Visit: Payer: Medicaid Other

## 2021-05-06 DIAGNOSIS — I10 Essential (primary) hypertension: Secondary | ICD-10-CM | POA: Diagnosis not present

## 2021-05-06 DIAGNOSIS — E785 Hyperlipidemia, unspecified: Secondary | ICD-10-CM | POA: Diagnosis not present

## 2021-05-06 DIAGNOSIS — E669 Obesity, unspecified: Secondary | ICD-10-CM | POA: Diagnosis not present

## 2021-05-06 DIAGNOSIS — N183 Chronic kidney disease, stage 3 unspecified: Secondary | ICD-10-CM | POA: Diagnosis not present

## 2021-07-28 DIAGNOSIS — I10 Essential (primary) hypertension: Secondary | ICD-10-CM | POA: Diagnosis not present

## 2021-07-28 DIAGNOSIS — N183 Chronic kidney disease, stage 3 unspecified: Secondary | ICD-10-CM | POA: Diagnosis not present

## 2021-07-28 DIAGNOSIS — E669 Obesity, unspecified: Secondary | ICD-10-CM | POA: Diagnosis not present

## 2021-07-28 DIAGNOSIS — H547 Unspecified visual loss: Secondary | ICD-10-CM | POA: Diagnosis not present

## 2021-07-28 DIAGNOSIS — R7303 Prediabetes: Secondary | ICD-10-CM | POA: Diagnosis not present

## 2021-08-04 ENCOUNTER — Other Ambulatory Visit: Payer: Self-pay | Admitting: Family Medicine

## 2021-08-04 DIAGNOSIS — Z1231 Encounter for screening mammogram for malignant neoplasm of breast: Secondary | ICD-10-CM

## 2021-08-17 ENCOUNTER — Ambulatory Visit: Payer: Medicaid Other

## 2022-06-29 ENCOUNTER — Inpatient Hospital Stay (HOSPITAL_COMMUNITY)
Admission: EM | Admit: 2022-06-29 | Discharge: 2022-07-06 | DRG: 417 | Disposition: A | Discharge status: Home / Self Care | Payer: Medicaid Other | Attending: Internal Medicine | Admitting: Internal Medicine

## 2022-06-29 ENCOUNTER — Emergency Department (HOSPITAL_COMMUNITY): Payer: Medicaid Other

## 2022-06-29 ENCOUNTER — Other Ambulatory Visit: Payer: Self-pay

## 2022-06-29 DIAGNOSIS — K8066 Calculus of gallbladder and bile duct with acute and chronic cholecystitis without obstruction: Principal | ICD-10-CM | POA: Diagnosis present

## 2022-06-29 DIAGNOSIS — E876 Hypokalemia: Secondary | ICD-10-CM | POA: Diagnosis present

## 2022-06-29 DIAGNOSIS — E669 Obesity, unspecified: Secondary | ICD-10-CM | POA: Diagnosis present

## 2022-06-29 DIAGNOSIS — D72829 Elevated white blood cell count, unspecified: Secondary | ICD-10-CM | POA: Diagnosis present

## 2022-06-29 DIAGNOSIS — R000 Tachycardia, unspecified: Secondary | ICD-10-CM | POA: Diagnosis present

## 2022-06-29 DIAGNOSIS — R7989 Other specified abnormal findings of blood chemistry: Secondary | ICD-10-CM | POA: Diagnosis not present

## 2022-06-29 DIAGNOSIS — A419 Sepsis, unspecified organism: Secondary | ICD-10-CM

## 2022-06-29 DIAGNOSIS — R509 Fever, unspecified: Secondary | ICD-10-CM | POA: Diagnosis present

## 2022-06-29 DIAGNOSIS — R0902 Hypoxemia: Secondary | ICD-10-CM | POA: Diagnosis present

## 2022-06-29 DIAGNOSIS — Z930 Tracheostomy status: Secondary | ICD-10-CM

## 2022-06-29 DIAGNOSIS — N3000 Acute cystitis without hematuria: Secondary | ICD-10-CM | POA: Diagnosis present

## 2022-06-29 DIAGNOSIS — I10 Essential (primary) hypertension: Secondary | ICD-10-CM | POA: Diagnosis present

## 2022-06-29 DIAGNOSIS — E872 Acidosis, unspecified: Secondary | ICD-10-CM | POA: Diagnosis present

## 2022-06-29 DIAGNOSIS — R109 Unspecified abdominal pain: Secondary | ICD-10-CM | POA: Diagnosis present

## 2022-06-29 DIAGNOSIS — J189 Pneumonia, unspecified organism: Secondary | ICD-10-CM | POA: Diagnosis present

## 2022-06-29 DIAGNOSIS — Z79899 Other long term (current) drug therapy: Secondary | ICD-10-CM

## 2022-06-29 DIAGNOSIS — K8020 Calculus of gallbladder without cholecystitis without obstruction: Principal | ICD-10-CM

## 2022-06-29 DIAGNOSIS — E785 Hyperlipidemia, unspecified: Secondary | ICD-10-CM | POA: Diagnosis present

## 2022-06-29 DIAGNOSIS — I129 Hypertensive chronic kidney disease with stage 1 through stage 4 chronic kidney disease, or unspecified chronic kidney disease: Secondary | ICD-10-CM | POA: Diagnosis present

## 2022-06-29 DIAGNOSIS — Z87891 Personal history of nicotine dependence: Secondary | ICD-10-CM

## 2022-06-29 DIAGNOSIS — N1832 Chronic kidney disease, stage 3b: Secondary | ICD-10-CM | POA: Diagnosis present

## 2022-06-29 DIAGNOSIS — N179 Acute kidney failure, unspecified: Secondary | ICD-10-CM | POA: Diagnosis present

## 2022-06-29 DIAGNOSIS — Z6841 Body Mass Index (BMI) 40.0 and over, adult: Secondary | ICD-10-CM

## 2022-06-29 DIAGNOSIS — B961 Klebsiella pneumoniae [K. pneumoniae] as the cause of diseases classified elsewhere: Secondary | ICD-10-CM | POA: Diagnosis present

## 2022-06-29 LAB — COMPREHENSIVE METABOLIC PANEL
ALT: 307 U/L — ABNORMAL HIGH (ref 0–44)
AST: 548 U/L — ABNORMAL HIGH (ref 15–41)
Albumin: 3.6 g/dL (ref 3.5–5.0)
Alkaline Phosphatase: 225 U/L — ABNORMAL HIGH (ref 38–126)
Anion gap: 11 (ref 5–15)
BUN: 12 mg/dL (ref 8–23)
CO2: 23 mmol/L (ref 22–32)
Calcium: 9 mg/dL (ref 8.9–10.3)
Chloride: 99 mmol/L (ref 98–111)
Creatinine, Ser: 1.39 mg/dL — ABNORMAL HIGH (ref 0.44–1.00)
GFR, Estimated: 42 mL/min — ABNORMAL LOW (ref >60)
Glucose, Bld: 169 mg/dL — ABNORMAL HIGH (ref 70–99)
Potassium: 2.7 mmol/L — CL (ref 3.5–5.1)
Sodium: 133 mmol/L — ABNORMAL LOW (ref 135–145)
Total Bilirubin: 4.2 mg/dL — ABNORMAL HIGH (ref 0.3–1.2)
Total Protein: 7.9 g/dL (ref 6.5–8.1)

## 2022-06-29 LAB — CBC WITH DIFFERENTIAL/PLATELET
Abs Immature Granulocytes: 0.02 10*3/uL (ref 0.00–0.07)
Basophils Absolute: 0 10*3/uL (ref 0.0–0.1)
Basophils Relative: 0 %
Eosinophils Absolute: 0 10*3/uL (ref 0.0–0.5)
Eosinophils Relative: 0 %
HCT: 40.4 % (ref 36.0–46.0)
Hemoglobin: 14 g/dL (ref 12.0–15.0)
Immature Granulocytes: 0 %
Lymphocytes Relative: 4 %
Lymphs Abs: 0.4 10*3/uL — ABNORMAL LOW (ref 0.7–4.0)
MCH: 27.9 pg (ref 26.0–34.0)
MCHC: 34.7 g/dL (ref 30.0–36.0)
MCV: 80.6 fL (ref 80.0–100.0)
Monocytes Absolute: 0.1 10*3/uL (ref 0.1–1.0)
Monocytes Relative: 1 %
Neutro Abs: 9.3 10*3/uL — ABNORMAL HIGH (ref 1.7–7.7)
Neutrophils Relative %: 95 %
Platelets: 323 10*3/uL (ref 150–400)
RBC: 5.01 MIL/uL (ref 3.87–5.11)
RDW: 13.9 % (ref 11.5–15.5)
WBC: 9.9 10*3/uL (ref 4.0–10.5)
nRBC: 0 % (ref 0.0–0.2)

## 2022-06-29 LAB — PROTIME-INR
INR: 1.1 (ref 0.8–1.2)
Prothrombin Time: 14.2 seconds (ref 11.4–15.2)

## 2022-06-29 LAB — LACTIC ACID, PLASMA: Lactic Acid, Venous: 5.2 mmol/L (ref 0.5–1.9)

## 2022-06-29 MED ORDER — ONDANSETRON HCL 4 MG/2ML IJ SOLN
4.0000 mg | Freq: Once | INTRAMUSCULAR | Status: AC
  Administered 2022-06-29: 4 mg via INTRAVENOUS
  Filled 2022-06-29: qty 2

## 2022-06-29 MED ORDER — SODIUM CHLORIDE 0.9 % IV BOLUS
1000.0000 mL | Freq: Once | INTRAVENOUS | Status: AC
  Administered 2022-06-29: 1000 mL via INTRAVENOUS

## 2022-06-29 MED ORDER — HYDROMORPHONE HCL 1 MG/ML IJ SOLN
1.0000 mg | Freq: Once | INTRAMUSCULAR | Status: AC
  Administered 2022-06-29: 1 mg via INTRAVENOUS
  Filled 2022-06-29: qty 1

## 2022-06-29 MED ORDER — ACETAMINOPHEN 650 MG RE SUPP: 650.0000 mg | RECTAL | Status: DC | PRN

## 2022-06-29 NOTE — ED Provider Notes (Signed)
EMERGENCY DEPARTMENT AT [facility] Provider Note   CSN: [id number] Arrival date & time: 06/29/22  2214     History {Add pertinent medical, surgical, social history, OB history to HPI:1} Chief Complaint  Patient presents with   Abdominal Pain    Catherine Erickson is a 67 y.o. female.  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  She woke from a nap at 1600 complaining of abdominal pain.  She had 1 large volume of watery, nonbloody diarrhea.  Daughter gave small amount of Pepto-Bismol as well as 2 tablets of Tylenol PM, 1 tablet of ibuprofen, and 2 tablets of Imodium.  Pain has remained constant and is worsening despite these medications.  Patient states that the pain is in her right upper abdomen.  It is nonradiating.  She has had associated body aches as well as subjective fever and chills.  No reported sick contacts or associated nausea, vomiting, urinary symptoms.  She denies a history of abdominal surgeries.  No recent antibiotic use or travel.  The history is provided by the patient. No language interpreter was used.  Abdominal Pain      Home Medications Prior to Admission medications   Medication Sig Start Date End Date Taking? Authorizing Provider  cyclobenzaprine (FLEXERIL) 10 MG tablet Take 1 tablet (10 mg total) by mouth 2 (two) times daily as needed for muscle spasms. 01/27/17   Glyn Ade, PA-C  HYDROcodone-acetaminophen (NORCO/VICODIN) 5-325 MG tablet Take 1 tablet by mouth every 4 (four) hours as needed. 12/05/16   Isla Pence, MD  lisinopril-hydrochlorothiazide (PRINZIDE,ZESTORETIC) 20-25 MG tablet Take 1 tablet by mouth daily after breakfast. 08/09/16   [provider]  metroNIDAZOLE (FLAGYL) 500 MG tablet Take 1 tablet (500 mg total) by mouth 2 (two) times daily. 12/05/16   Isla Pence, MD  naproxen  (NAPROSYN) 500 MG tablet Take 1 tablet (500 mg total) by mouth 2 (two) times daily. 01/27/17   Glyn Ade, PA-C  ondansetron (ZOFRAN ODT) 4 MG disintegrating tablet Take 1 tablet (4 mg total) by mouth every 8 (eight) hours as needed. 12/05/16   Isla Pence, MD  Probiotic Product (SOLUBLE FIBER/PROBIOTICS) CHEW Chew 1 capsule by mouth 2 (two) times daily. 12/05/16   Isla Pence, MD  rosuvastatin (CRESTOR) 20 MG tablet Take 20 mg by mouth daily.    [provider]      Allergies    Patient has no known allergies.    Review of Systems   Review of Systems  Gastrointestinal:  Positive for abdominal pain.  Ten systems reviewed and are negative for acute change, except as noted in the HPI.    Physical Exam Updated Vital Signs BP (!) 148/126 (BP Location: Left Arm)   Pulse (!) 138   Temp (!) 103 F (39.4 C) (Oral)   Resp 20   SpO2 99%  Physical Exam Vitals and nursing note reviewed.  Constitutional:      General: She is not in acute distress.    Appearance: She is well-developed. She is not diaphoretic.     Comments: Obese female. Appears uncomfortable, rocking side to side in the bed.  HENT:     Head: Normocephalic and atraumatic.  Eyes:     General: No scleral icterus.    Conjunctiva/sclera: Conjunctivae normal.  Cardiovascular:     Rate and Rhythm: Regular rhythm. Tachycardia present.  Pulses: Normal pulses.  Pulmonary:     Effort: Pulmonary effort is normal. No respiratory distress.     Breath sounds: No stridor. No wheezing.     Comments: Lungs grossly CTAB Abdominal:     Palpations: Abdomen is soft.     Tenderness: There is abdominal tenderness (RUQ). There is no guarding.     Comments: Negative Murphy's sign. No peritoneal signs. Exam limited 2/2 habitus.  Musculoskeletal:        General: Normal range of motion.     Cervical back: Normal range of motion.  Skin:    General: Skin is warm and dry.     Coloration: Skin is not pale.     Findings:  No erythema or rash.  Neurological:     Mental Status: She is alert and oriented to person, place, and time.  Psychiatric:        Behavior: Behavior normal.     ED Results / Procedures / Treatments   Labs (all labs ordered are listed, but only abnormal results are displayed) Labs Reviewed  CULTURE, BLOOD (ROUTINE X 2)  CULTURE, BLOOD (ROUTINE X 2)  CBC WITH DIFFERENTIAL/PLATELET  COMPREHENSIVE METABOLIC PANEL  LACTIC ACID, PLASMA  LACTIC ACID, PLASMA  PROTIME-INR  URINALYSIS, ROUTINE W REFLEX MICROSCOPIC  LIPASE, BLOOD    EKG None  Radiology DG Chest Port 1 View  Result Date: 06/29/2022 CLINICAL DATA:  Shortness of breath EXAM: PORTABLE CHEST 1 VIEW COMPARISON:  12/01/2016 FINDINGS: Low lung volumes accentuate cardiomediastinal silhouette and pulmonary vascularity. Bibasilar atelectasis. Stable cardiomediastinal silhouette. Tracheostomy. IMPRESSION: Low lung volumes with bibasilar atelectasis. Electronically Signed   By: Placido Sou M.D.   On: 06/29/2022 22:59    Procedures Procedures  {Document cardiac monitor, telemetry assessment procedure when appropriate:1}  Medications Ordered in ED Medications  acetaminophen (TYLENOL) suppository 650 mg (has no administration in time range)  HYDROmorphone (DILAUDID) injection 1 mg (has no administration in time range)  ondansetron (ZOFRAN) injection 4 mg (has no administration in time range)  sodium chloride 0.9 % bolus 1,000 mL (has no administration in time range)    ED Course/ Medical Decision Making/ A&P Clinical Course as of 06/29/22 2305  Tue Jun 29, 2022  2301 Patient complaining of pain primarily in the right upper quadrant.  Patient is obese making physical exam challenging due to habitus.  Cannot exclude biliary pathology; specifically looking to assess for acute cholecystitis versus cholangitis as she is febrile to 103 F and tachycardic.  SIRS/sepsis workup initiated.  Will give Tylenol in the ED for fever.  While  there is plan to complete right upper quadrant ultrasound, will also broaden imaging with abdominal CT.  Patient and daughter updated on plan. [id number] DDx also includes RLL PNA vs pyelonephritis vs colitis vs appendicitis vs pSBO/SBO. [KH]    Clinical Course User Index [KH] Antonietta Breach, PA-C   {   Click here for ABCD2, HEART and other calculatorsREFRESH Note before signing :1}                          Medical Decision Making Amount and/or Complexity of Data Reviewed Labs: ordered. Radiology: ordered.  Risk OTC drugs. Prescription drug management.   ***  {Document critical care time when appropriate:1} {Document review of labs and clinical decision tools ie heart score, Chads2Vasc2 etc:1}  {Document your independent review of radiology images, and any outside records:1} {Document your discussion with family members, caretakers, and with consultants:1} {Document  social determinants of health affecting pt's care:1} {Document your decision making why or why not admission, treatments were needed:1} Final Clinical Impression(s) / ED Diagnoses Final diagnoses:  None    Rx / DC Orders ED Discharge Orders     None

## 2022-06-29 NOTE — ED Triage Notes (Addendum)
BIB EMS from home.  Called out for abdominal pain and diarrhea since this morning.  Has tried pepto bismol, tylenol, ibuprofen, and 1 dose of immodium.  Has not had any improvement.  Body aches, reported fever.  Feels hot to touch.  Chills.  CBG 177,  Trach patient. No O2

## 2022-06-30 ENCOUNTER — Encounter (HOSPITAL_COMMUNITY): Payer: Self-pay | Admitting: Internal Medicine

## 2022-06-30 ENCOUNTER — Emergency Department (HOSPITAL_COMMUNITY): Payer: Medicaid Other

## 2022-06-30 DIAGNOSIS — B961 Klebsiella pneumoniae [K. pneumoniae] as the cause of diseases classified elsewhere: Secondary | ICD-10-CM | POA: Diagnosis present

## 2022-06-30 DIAGNOSIS — R509 Fever, unspecified: Secondary | ICD-10-CM | POA: Diagnosis present

## 2022-06-30 DIAGNOSIS — J189 Pneumonia, unspecified organism: Secondary | ICD-10-CM | POA: Diagnosis present

## 2022-06-30 DIAGNOSIS — E876 Hypokalemia: Secondary | ICD-10-CM | POA: Diagnosis present

## 2022-06-30 DIAGNOSIS — Z6841 Body Mass Index (BMI) 40.0 and over, adult: Secondary | ICD-10-CM | POA: Diagnosis not present

## 2022-06-30 DIAGNOSIS — R0902 Hypoxemia: Secondary | ICD-10-CM | POA: Diagnosis present

## 2022-06-30 DIAGNOSIS — R6521 Severe sepsis with septic shock: Secondary | ICD-10-CM | POA: Diagnosis not present

## 2022-06-30 DIAGNOSIS — E669 Obesity, unspecified: Secondary | ICD-10-CM | POA: Diagnosis present

## 2022-06-30 DIAGNOSIS — N3000 Acute cystitis without hematuria: Secondary | ICD-10-CM | POA: Diagnosis present

## 2022-06-30 DIAGNOSIS — Z930 Tracheostomy status: Secondary | ICD-10-CM | POA: Diagnosis not present

## 2022-06-30 DIAGNOSIS — A419 Sepsis, unspecified organism: Secondary | ICD-10-CM

## 2022-06-30 DIAGNOSIS — R109 Unspecified abdominal pain: Secondary | ICD-10-CM | POA: Diagnosis present

## 2022-06-30 DIAGNOSIS — Z79899 Other long term (current) drug therapy: Secondary | ICD-10-CM | POA: Diagnosis not present

## 2022-06-30 DIAGNOSIS — I129 Hypertensive chronic kidney disease with stage 1 through stage 4 chronic kidney disease, or unspecified chronic kidney disease: Secondary | ICD-10-CM | POA: Diagnosis present

## 2022-06-30 DIAGNOSIS — D72829 Elevated white blood cell count, unspecified: Secondary | ICD-10-CM | POA: Diagnosis present

## 2022-06-30 DIAGNOSIS — E872 Acidosis, unspecified: Secondary | ICD-10-CM | POA: Diagnosis present

## 2022-06-30 DIAGNOSIS — N1832 Chronic kidney disease, stage 3b: Secondary | ICD-10-CM | POA: Diagnosis present

## 2022-06-30 DIAGNOSIS — R7989 Other specified abnormal findings of blood chemistry: Secondary | ICD-10-CM | POA: Diagnosis not present

## 2022-06-30 DIAGNOSIS — K810 Acute cholecystitis: Secondary | ICD-10-CM | POA: Diagnosis not present

## 2022-06-30 DIAGNOSIS — I10 Essential (primary) hypertension: Secondary | ICD-10-CM | POA: Diagnosis not present

## 2022-06-30 DIAGNOSIS — K8066 Calculus of gallbladder and bile duct with acute and chronic cholecystitis without obstruction: Secondary | ICD-10-CM | POA: Diagnosis present

## 2022-06-30 DIAGNOSIS — E785 Hyperlipidemia, unspecified: Secondary | ICD-10-CM | POA: Diagnosis present

## 2022-06-30 DIAGNOSIS — Z87891 Personal history of nicotine dependence: Secondary | ICD-10-CM | POA: Diagnosis not present

## 2022-06-30 DIAGNOSIS — R000 Tachycardia, unspecified: Secondary | ICD-10-CM | POA: Diagnosis present

## 2022-06-30 DIAGNOSIS — N179 Acute kidney failure, unspecified: Secondary | ICD-10-CM | POA: Diagnosis present

## 2022-06-30 LAB — URINALYSIS, ROUTINE W REFLEX MICROSCOPIC
Glucose, UA: NEGATIVE mg/dL
Hgb urine dipstick: NEGATIVE
Ketones, ur: NEGATIVE mg/dL
Leukocytes,Ua: NEGATIVE
Nitrite: NEGATIVE
Protein, ur: 30 mg/dL — AB
Specific Gravity, Urine: 1.01 (ref 1.005–1.030)
pH: 7 (ref 5.0–8.0)

## 2022-06-30 LAB — COMPREHENSIVE METABOLIC PANEL
ALT: 238 U/L — ABNORMAL HIGH (ref 0–44)
AST: 320 U/L — ABNORMAL HIGH (ref 15–41)
Albumin: 2.8 g/dL — ABNORMAL LOW (ref 3.5–5.0)
Alkaline Phosphatase: 148 U/L — ABNORMAL HIGH (ref 38–126)
Anion gap: 12 (ref 5–15)
BUN: 15 mg/dL (ref 8–23)
CO2: 24 mmol/L (ref 22–32)
Calcium: 8.2 mg/dL — ABNORMAL LOW (ref 8.9–10.3)
Chloride: 100 mmol/L (ref 98–111)
Creatinine, Ser: 1.53 mg/dL — ABNORMAL HIGH (ref 0.44–1.00)
GFR, Estimated: 38 mL/min — ABNORMAL LOW (ref >60)
Glucose, Bld: 157 mg/dL — ABNORMAL HIGH (ref 70–99)
Potassium: 2.9 mmol/L — ABNORMAL LOW (ref 3.5–5.1)
Sodium: 136 mmol/L (ref 135–145)
Total Bilirubin: 4.2 mg/dL — ABNORMAL HIGH (ref 0.3–1.2)
Total Protein: 6.5 g/dL (ref 6.5–8.1)

## 2022-06-30 LAB — BLOOD CULTURE ID PANEL (REFLEXED) - BCID2

## 2022-06-30 LAB — CBC WITH DIFFERENTIAL/PLATELET
Abs Immature Granulocytes: 0.76 10*3/uL — ABNORMAL HIGH (ref 0.00–0.07)
Basophils Absolute: 0.1 10*3/uL (ref 0.0–0.1)
Basophils Relative: 0 %
Eosinophils Absolute: 0 10*3/uL (ref 0.0–0.5)
Eosinophils Relative: 0 %
HCT: 34.3 % — ABNORMAL LOW (ref 36.0–46.0)
Hemoglobin: 11.4 g/dL — ABNORMAL LOW (ref 12.0–15.0)
Immature Granulocytes: 2 %
Lymphocytes Relative: 2 %
Lymphs Abs: 0.7 10*3/uL (ref 0.7–4.0)
MCH: 27.3 pg (ref 26.0–34.0)
MCHC: 33.2 g/dL (ref 30.0–36.0)
MCV: 82.3 fL (ref 80.0–100.0)
Monocytes Absolute: 1.2 10*3/uL — ABNORMAL HIGH (ref 0.1–1.0)
Monocytes Relative: 4 %
Neutro Abs: 28.7 10*3/uL — ABNORMAL HIGH (ref 1.7–7.7)
Neutrophils Relative %: 92 %
Platelets: 257 10*3/uL (ref 150–400)
RBC: 4.17 MIL/uL (ref 3.87–5.11)
RDW: 14.5 % (ref 11.5–15.5)
WBC: 31.5 10*3/uL — ABNORMAL HIGH (ref 4.0–10.5)
nRBC: 0 % (ref 0.0–0.2)

## 2022-06-30 LAB — MRSA NEXT GEN BY PCR, NASAL: MRSA by PCR Next Gen: NOT DETECTED

## 2022-06-30 LAB — BILIRUBIN, DIRECT: Bilirubin, Direct: 2.7 mg/dL — ABNORMAL HIGH (ref 0.0–0.2)

## 2022-06-30 LAB — LACTIC ACID, PLASMA
Lactic Acid, Venous: 4 mmol/L (ref 0.5–1.9)
Lactic Acid, Venous: 3.6 mmol/L (ref 0.5–1.9)
Lactic Acid, Venous: 1.9 mmol/L (ref 0.5–1.9)
Lactic Acid, Venous: 1.4 mmol/L (ref 0.5–1.9)

## 2022-06-30 LAB — MAGNESIUM
Magnesium: 1.6 mg/dL — ABNORMAL LOW (ref 1.7–2.4)
Magnesium: 1.8 mg/dL (ref 1.7–2.4)

## 2022-06-30 LAB — LIPASE, BLOOD: Lipase: 27 U/L (ref 11–51)

## 2022-06-30 LAB — GAMMA GT: GGT: 178 U/L — ABNORMAL HIGH (ref 7–50)

## 2022-06-30 LAB — CULTURE, BLOOD (ROUTINE X 2)

## 2022-06-30 MED ORDER — HYDROMORPHONE HCL 1 MG/ML IJ SOLN
0.5000 mg | INTRAMUSCULAR | Status: DC | PRN
  Administered 2022-07-01 – 2022-07-06 (×11): 0.5 mg via INTRAVENOUS
  Filled 2022-06-30 (×5): qty 0.5
  Filled 2022-06-30: qty 1
  Filled 2022-06-30 (×7): qty 0.5

## 2022-06-30 MED ORDER — VANCOMYCIN HCL 1250 MG/250ML IV SOLN: 1250.0000 mg | INTRAVENOUS | Status: DC

## 2022-06-30 MED ORDER — IOHEXOL 300 MG/ML  SOLN
75.0000 mL | Freq: Once | INTRAMUSCULAR | Status: AC | PRN
  Administered 2022-06-30: 75 mL via INTRAVENOUS

## 2022-06-30 MED ORDER — VANCOMYCIN HCL 2000 MG/400ML IV SOLN
2000.0000 mg | Freq: Once | INTRAVENOUS | Status: AC
  Administered 2022-06-30: 2000 mg via INTRAVENOUS
  Filled 2022-06-30: qty 400

## 2022-06-30 MED ORDER — METRONIDAZOLE 500 MG/100ML IV SOLN
500.0000 mg | Freq: Two times a day (BID) | INTRAVENOUS | Status: DC
  Administered 2022-06-30 – 2022-07-05 (×11): 500 mg via INTRAVENOUS
  Filled 2022-06-30 (×12): qty 100

## 2022-06-30 MED ORDER — ORAL CARE MOUTH RINSE: 15.0000 mL | OROMUCOSAL | Status: DC | PRN

## 2022-06-30 MED ORDER — LACTATED RINGERS IV SOLN: INTRAVENOUS | Status: DC

## 2022-06-30 MED ORDER — ACETAMINOPHEN 650 MG RE SUPP: 650.0000 mg | Freq: Four times a day (QID) | RECTAL | Status: DC | PRN

## 2022-06-30 MED ORDER — CYCLOBENZAPRINE HCL 10 MG PO TABS: 10.0000 mg | ORAL_TABLET | Freq: Two times a day (BID) | ORAL | Status: DC | PRN

## 2022-06-30 MED ORDER — POTASSIUM CHLORIDE 10 MEQ/100ML IV SOLN
10.0000 meq | INTRAVENOUS | Status: AC
  Administered 2022-06-30 (×3): 10 meq via INTRAVENOUS
  Filled 2022-06-30 (×3): qty 100

## 2022-06-30 MED ORDER — HYDROCODONE-ACETAMINOPHEN 5-325 MG PO TABS
1.0000 | ORAL_TABLET | ORAL | Status: DC | PRN
  Administered 2022-06-30 – 2022-07-01 (×2): 1 via ORAL
  Filled 2022-06-30 (×2): qty 1

## 2022-06-30 MED ORDER — HYDROMORPHONE HCL 1 MG/ML IJ SOLN
0.5000 mg | Freq: Once | INTRAMUSCULAR | Status: AC
  Administered 2022-06-30: 0.5 mg via INTRAVENOUS
  Filled 2022-06-30: qty 1

## 2022-06-30 MED ORDER — ORAL CARE MOUTH RINSE
15.0000 mL | OROMUCOSAL | Status: DC
  Administered 2022-06-30 – 2022-07-06 (×17): 15 mL via OROMUCOSAL

## 2022-06-30 MED ORDER — ACETAMINOPHEN 325 MG PO TABS
650.0000 mg | ORAL_TABLET | Freq: Four times a day (QID) | ORAL | Status: DC | PRN
  Administered 2022-07-01 – 2022-07-02 (×2): 650 mg via ORAL
  Filled 2022-06-30 (×2): qty 2

## 2022-06-30 MED ORDER — HEPARIN SODIUM (PORCINE) 5000 UNIT/ML IJ SOLN
5000.0000 [IU] | Freq: Three times a day (TID) | INTRAMUSCULAR | Status: DC
  Administered 2022-06-30 – 2022-07-06 (×17): 5000 [IU] via SUBCUTANEOUS
  Filled 2022-06-30 (×17): qty 1

## 2022-06-30 MED ORDER — CHLORHEXIDINE GLUCONATE CLOTH 2 % EX PADS
6.0000 | MEDICATED_PAD | Freq: Every day | CUTANEOUS | Status: DC
  Administered 2022-06-30 – 2022-07-01 (×2): 6 via TOPICAL

## 2022-06-30 MED ORDER — PIPERACILLIN-TAZOBACTAM 3.375 G IVPB 30 MIN
3.3750 g | Freq: Once | INTRAVENOUS | Status: AC
  Administered 2022-06-30: 3.375 g via INTRAVENOUS
  Filled 2022-06-30: qty 50

## 2022-06-30 MED ORDER — NALOXONE HCL 0.4 MG/ML IJ SOLN: 0.4000 mg | INTRAMUSCULAR | Status: DC | PRN

## 2022-06-30 MED ORDER — MAGNESIUM SULFATE 2 GM/50ML IV SOLN
2.0000 g | Freq: Once | INTRAVENOUS | Status: AC
  Administered 2022-06-30: 2 g via INTRAVENOUS
  Filled 2022-06-30: qty 50

## 2022-06-30 MED ORDER — POTASSIUM CHLORIDE 10 MEQ/100ML IV SOLN
10.0000 meq | INTRAVENOUS | Status: AC
  Administered 2022-06-30 (×5): 10 meq via INTRAVENOUS
  Filled 2022-06-30 (×5): qty 100

## 2022-06-30 MED ORDER — ONDANSETRON HCL 4 MG/2ML IJ SOLN: 4.0000 mg | Freq: Four times a day (QID) | INTRAMUSCULAR | Status: DC | PRN

## 2022-06-30 MED ORDER — SODIUM CHLORIDE 0.9 % IV BOLUS
1000.0000 mL | Freq: Once | INTRAVENOUS | Status: AC
  Administered 2022-06-30: 1000 mL via INTRAVENOUS

## 2022-06-30 MED ORDER — LIP MEDEX EX OINT
TOPICAL_OINTMENT | CUTANEOUS | Status: DC | PRN
  Filled 2022-06-30: qty 7

## 2022-06-30 MED ORDER — PIPERACILLIN-TAZOBACTAM 3.375 G IVPB
3.3750 g | Freq: Three times a day (TID) | INTRAVENOUS | Status: DC
  Administered 2022-06-30 (×2): 3.375 g via INTRAVENOUS
  Filled 2022-06-30 (×2): qty 50

## 2022-06-30 MED ORDER — SODIUM CHLORIDE 0.9 % IV SOLN
2.0000 g | INTRAVENOUS | Status: DC
  Administered 2022-06-30 – 2022-07-01 (×2): 2 g via INTRAVENOUS
  Filled 2022-06-30 (×2): qty 20

## 2022-06-30 MED ORDER — MELATONIN 3 MG PO TABS
3.0000 mg | ORAL_TABLET | Freq: Every evening | ORAL | Status: DC | PRN
  Administered 2022-06-30: 3 mg via ORAL
  Filled 2022-06-30: qty 1

## 2022-06-30 NOTE — Progress Notes (Signed)
  Carryover admission to the Day Admitter.  I discussed this case with the EDP, Antonietta Breach.  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  Found to be objectively febrile with initial temperature of 103, mildly tachycardic.  Presenting labs notable for elevated liver enzymes.  White blood cell count nonelevated, but reportedly associated with left shift.  Initial lactate elevated.  Rectal quadrant ultrasound showed gallstones, but no evidence of acute process, including no evidence of acute cholecystitis or any evidence of choledocholithiasis or common bile duct dilation.  Blood cultures x 2 were collected and the patient was started on broad-spectrum IV antibiotics in the form of IV vancomycin and Zosyn.  I have placed an order for inpatient admission to PCU for further evaluation and management of the above.  I have placed some additional preliminary admit orders via the adult multi-morbid admission order set. I have also ordered continuation of existing IV antibiotics in the form of IV vancomycin and Zosyn.  I also ordered continuous lactated Ringer's running at 105 cc/h, prn Zofran.  For further assessment of presenting elevated liver enzymes, also ordered direct bilirubin and GGT levels.  Have ordered morning labs in the form of CMP, CBC, and serum magnesium level.  Prn IV Dilaudid ordered for abdominal pain    Babs Bertin, DO Hospitalist

## 2022-06-30 NOTE — Progress Notes (Signed)
PHARMACY NOTE -  SQ heparin  Pharmacy has been assisting with dosing of heparin for VTE prophylaxis. Dosage remains stable at 5000 units and further renal adjustments per institutional Pharmacy antibiotic protocol  Pharmacy will sign off, following peripherally for dose adjustments and clinical status changes.  Reuel Boom, PharmD, BCPS [phone] 06/30/2022, 7:45 AM

## 2022-06-30 NOTE — H&P (Signed)
History and Physical  Catherine Erickson [id number] DOB: [birth year] DOA: 06/29/2022   PCP: Pcp, No   Patient coming from: Home   Chief Complaint: Abdominal Pain   HPI: Catherine Erickson is a 67 y.o. female with medical history significant for trach dependence, chronic pain, hypertension and hyperlipidemia admitted to the hospital with severe sepsis of unclear origin.  Patient is tracheostomy dependent, unable to speak clearly, however we are able to communicate quite effectively.  The patient states that she lives with her daughter independently community, she is able to perform all of her own ADLs.  She was in her usual state of health until yesterday afternoon, when she was taking a nap.  She woke up from the nap with sudden onset of crampy diffuse epigastric abdominal pain.  She had a large bowel movement which she says was not quite normal, but was not quite diarrhea.  The abdominal continued to be quite severe, so she came to the hospital for evaluation.  She specifically denies any chest pain, nausea, vomiting, abdominal pain like this in the past.  Denies any blood in her stool, denies any diarrhea.  No recent sick contacts, no personal or family history of inflammatory bowel disease or other chronic digestive problems.  She denies any recent medication changes.  ED Course: In the emergency department, she was found to be febrile to 103, heart rate 138, blood pressure 148/126.  She was saturating well on her tracheostomy collar.  She was given aggressive IV fluid resuscitation, including empiric IV vancomycin and Zosyn.  Her pressures stabilized without vasopressors, and she was admitted to the hospitalist service overnight.  Currently: Patient is seen and examined in the emergency department resting comfortably, she has normal vital signs.  She feels comfortable, she is asymptomatic and feels like she is back to normal.  Review of Systems: Please see HPI for pertinent positives and  negatives. A complete 10 system review of systems are otherwise negative.  Past Medical History:  Diagnosis Date   Cancer [facility])    Past Surgical History:  Procedure Laterality Date   ESOPHAGOSCOPY Right 10/02/2016   Procedure: ESOPHAGOSCOPY;  Surgeon: Carol Ada, MD;  Location: [place];  Service: Endoscopy;  Laterality: Right;   IR FLUORO GUIDE CV LINE RIGHT  10/22/2016   TRACHEOSTOMY N/A    VIDEO ASSISTED THORACOSCOPY (VATS)/EMPYEMA Right 10/15/2016   Procedure: THOROCOTOMY DRAINAGE OF MEDIASTINAL ABSCESS AND EMPYEMA;  Surgeon: Gaye Pollack, MD;  Location: [place];  Service: Thoracic;  Laterality: Right;   VIDEO BRONCHOSCOPY N/A 10/02/2016   Procedure: VIDEO BRONCHOSCOPY;  Surgeon: Melrose Nakayama, MD;  Location: [facility];  Service: Thoracic;  Laterality: N/A;    Social History:  reports that she has quit smoking. She has never used smokeless tobacco. She reports that she does not drink alcohol and does not use drugs.   No Known Allergies  No family history on file.   Prior to Admission medications   Medication Sig Start Date End Date Taking? Authorizing Provider  cyclobenzaprine (FLEXERIL) 10 MG tablet Take 1 tablet (10 mg total) by mouth 2 (two) times daily as needed for muscle spasms. 01/27/17   Glyn Ade, PA-C  HYDROcodone-acetaminophen (NORCO/VICODIN) 5-325 MG tablet Take 1 tablet by mouth every 4 (four) hours as needed. 12/05/16   Isla Pence, MD  lisinopril-hydrochlorothiazide (PRINZIDE,ZESTORETIC) 20-25 MG tablet Take 1 tablet by mouth daily after breakfast. 08/09/16   [provider]  metroNIDAZOLE (FLAGYL) 500 MG tablet Take 1 tablet (500  mg total) by mouth 2 (two) times daily. 12/05/16   Isla Pence, MD  naproxen (NAPROSYN) 500 MG tablet Take 1 tablet (500 mg total) by mouth 2 (two) times daily. 01/27/17   Glyn Ade, PA-C  ondansetron (ZOFRAN ODT) 4 MG disintegrating tablet Take 1 tablet (4 mg total) by mouth every 8 (eight) hours as needed.  12/05/16   Isla Pence, MD  Probiotic Product (SOLUBLE FIBER/PROBIOTICS) CHEW Chew 1 capsule by mouth 2 (two) times daily. 12/05/16   Isla Pence, MD  rosuvastatin (CRESTOR) 20 MG tablet Take 20 mg by mouth daily.    [provider]    Physical Exam: BP 124/65   Pulse 89   Temp 99.1 F (37.3 C) (Oral)   Resp 14   Ht '5\' 2"'$  (1.575 m)   Wt 108.9 kg   SpO2 94%   BMI 43.90 kg/m   General:  Alert, oriented, calm, in no acute distress, tracheostomy in place Eyes: EOMI, clear conjuctivae, white sclerea Neck: supple, no masses, trachea mildline  Cardiovascular: RRR, no murmurs or rubs, no peripheral edema  Respiratory: clear to auscultation bilaterally, no wheezes, no crackles  Abdomen: soft, nontender, distended, normal bowel tones heard  Skin: dry, no rashes  Musculoskeletal: no joint effusions, normal range of motion  Psychiatric: appropriate affect, normal speech  Neurologic: extraocular muscles intact, clear speech, moving all extremities with intact sensorium          Labs on Admission:  Basic Metabolic Panel: Recent Labs  Lab 06/29/22 2242 06/30/22 0042 06/30/22 0427  NA 133*  --  136  K 2.7*  --  2.9*  CL 99  --  100  CO2 23  --  24  GLUCOSE 169*  --  157*  BUN 12  --  15  CREATININE 1.39*  --  1.53*  CALCIUM 9.0  --  8.2*  MG  --  1.8 1.6*   Liver Function Tests: Recent Labs  Lab 06/29/22 2242 06/30/22 0427  AST 548* 320*  ALT 307* 238*  ALKPHOS 225* 148*  BILITOT 4.2* 4.2*  PROT 7.9 6.5  ALBUMIN 3.6 2.8*   Recent Labs  Lab 06/29/22 0042  LIPASE 27   No results for input(s): "AMMONIA" in the last 168 hours. CBC: Recent Labs  Lab 06/29/22 2242 06/30/22 0427  WBC 9.9 31.5*  NEUTROABS 9.3* 28.7*  HGB 14.0 11.4*  HCT 40.4 34.3*  MCV 80.6 82.3  PLT 323 257   Cardiac Enzymes: No results for input(s): "CKTOTAL", "CKMB", "CKMBINDEX", "TROPONINI" in the last 168 hours.  BNP (last 3 results) No results for input(s): "BNP" in the  last 8760 hours.  ProBNP (last 3 results) No results for input(s): "PROBNP" in the last 8760 hours.  CBG: No results for input(s): "GLUCAP" in the last 168 hours.  Radiological Exams on Admission: CT ABDOMEN PELVIS W CONTRAST  Result Date: 06/30/2022 CLINICAL DATA:  Abdominal pain and diarrhea for 1 day. EXAM: CT ABDOMEN AND PELVIS WITH CONTRAST TECHNIQUE: Multidetector CT imaging of the abdomen and pelvis was performed using the standard protocol following bolus administration of intravenous contrast. RADIATION DOSE REDUCTION: This exam was performed according to the departmental dose-optimization program which includes automated exposure control, adjustment of the mA and/or kV according to patient size and/or use of iterative reconstruction technique. CONTRAST:  40m OMNIPAQUE IOHEXOL 300 MG/ML  SOLN COMPARISON:  12/05/2016. FINDINGS: Lower chest: The heart is mildly enlarged. Bronchial wall thickening is noted in the lower lobes bilaterally and scattered airspace  opacities are present at the lung bases. Hepatobiliary: No focal liver abnormality is seen. Stones are present in the gallbladder. No biliary ductal dilatation. Pancreas: Unremarkable. No pancreatic ductal dilatation or surrounding inflammatory changes. Spleen: Normal in size without focal abnormality. Adrenals/Urinary Tract: The adrenal glands are within normal limits. A vague hypodensity is present in the upper pole of the left kidney. Renal cortical scarring is present bilaterally. Cysts are noted in the right kidney. No renal calculus or hydronephrosis. The bladder is unremarkable. Stomach/Bowel: Stomach is within normal limits. Appendix appears normal. There is focal dilatation of the duodenum with normal caliber of the remaining small bowel. No focal bowel wall thickening or surrounding inflammatory changes. No free air or pneumatosis. Vascular/Lymphatic: Aortic atherosclerosis. No enlarged abdominal or pelvic lymph nodes. Reproductive:  Uterus and bilateral adnexa are unremarkable. Other: No abdominopelvic ascites. Musculoskeletal: Degenerative changes are present in the thoracolumbar spine. There is sclerosis at the sacroiliac joints bilaterally, compatible with sacroiliitis. IMPRESSION: 1. No abnormality to explain patient's reported diarrhea. 2. Bronchial wall thickening in the lower lobes bilaterally with patchy airspace disease at the lung bases, concerning for pneumonia. 3. Cholelithiasis. 4. Vague hypodensity in the upper pole of the left kidney, may represent pyelonephritis in the appropriate clinical setting. 5. Aortic atherosclerosis. Electronically Signed   By: Brett Fairy M.D.   On: 06/30/2022 01:27   US Abdomen Limited  Result Date: 06/29/2022 CLINICAL DATA:  Right upper quadrant pain. EXAM: ULTRASOUND ABDOMEN LIMITED RIGHT UPPER QUADRANT COMPARISON:  12/05/2016. FINDINGS: Gallbladder: Stones are present within the gallbladder. No gallbladder wall thickening. No sonographic Murphy sign noted by sonographer. Common bile duct: Diameter: 4.1 mm Liver: No focal lesion identified. Within normal limits in parenchymal echogenicity. Portal vein is patent on color Doppler imaging with normal direction of blood flow towards the liver. Other: No ascites. IMPRESSION: Cholelithiasis without acute cholecystitis. Electronically Signed   By: Brett Fairy M.D.   On: 06/29/2022 23:30   DG Chest Port 1 View  Result Date: 06/29/2022 CLINICAL DATA:  Shortness of breath EXAM: PORTABLE CHEST 1 VIEW COMPARISON:  12/01/2016 FINDINGS: Low lung volumes accentuate cardiomediastinal silhouette and pulmonary vascularity. Bibasilar atelectasis. Stable cardiomediastinal silhouette. Tracheostomy. IMPRESSION: Low lung volumes with bibasilar atelectasis. Electronically Signed   By: Placido Sou M.D.   On: 06/29/2022 22:59    Assessment/Plan Principal Problem:   Septic shock ([place]) -patient presented with abdominal pain, fever, tachycardia, unclear  origin.  Imaging not revealing.  She was meeting septic shock criteria with fever, tachycardia, AKI and initial lactate of 5.2.  Patient was fluid responsive.  She is now resting comfortably, asymptomatic with normal vital signs.  Etiology is unclear, she has been covered broadly with antibiotics. -Inpatient admission to progressive unit -Continue aggressive IV fluid resuscitation, follow urine output -Continue empiric IV vancomycin and Zosyn per pharmacy -Blood cultures were obtained in the ER, will follow -Currently since patient is asymptomatic, will start on clear liquid diet and advance as tolerated Active Problems:   Essential hypertension -holding home medications due to AKI   Abdominal pain -etiology unclear, resolved   Fever   Tachycardia   Leukocytosis   Hypokalemia -repleted overnight but still not at goal, will give 50 mEq IV this morning   Hypomagnesemia -will replete IV   Tracheostomy dependence (HCC)   Lactic acidosis -continue aggressive IV fluids, follow urine output, and follow lactate  DVT prophylaxis: HepSQ   Code Status: Full   Family Communication: None present.   Admission status: Inpatient  Time spent: 46 minutes  Haasini Patnaude Neva Seat MD [facility] Pager [phone]  If 7PM-7AM, please contact night-coverage [email or web address] Password [facility]  06/30/2022, 7:24 AM

## 2022-06-30 NOTE — Progress Notes (Signed)
Pts original trach was missing the inner cannula.  Changed pts trach out per md order. Placed a size 6 shiley uncuffed with out complications. Confirmed with ezcap co2 detector. Pts o2 saturations were 96% post change. Pt on 35% atc.

## 2022-06-30 NOTE — Progress Notes (Signed)
A consult was received from an ED physician for Vancomycin + Zosyn per pharmacy dosing.  The patient's profile has been reviewed for ht/wt/allergies/indication/available labs.   A one time order has been placed for Zosyn 3.375gm & Vancomycin 2gm IV.  Further antibiotics/pharmacy consults should be ordered by admitting physician if indicated.                       Thank you, Netta Cedars PharmD 06/30/2022  12:47 AM

## 2022-06-30 NOTE — Progress Notes (Signed)
Pharmacy Antibiotic Note  Catherine Erickson is a 67 y.o. female admitted on 06/29/2022 with sepsis.  Pharmacy has been consulted for Vancomycin & Zosyn dosing.  Plan: Zosyn 3.375g IV q8h (4 hour infusion). Vancomycin '1250mg'$  IV q36h to target AUC 400-550 Estimated AUC on this regimen: 490 Daily Scr while on Vanc/Zosyn Monitor renal function and cx data   Height: '5\' 2"'$  (157.5 cm) Weight: 108.9 kg (240 lb) IBW/kg (Calculated) : 50.1  Temp (24hrs), Avg:101.4 F (38.6 C), Min:99.8 F (37.7 C), Max:103 F (39.4 C)  Recent Labs  Lab 06/29/22 2241 06/29/22 2242 06/30/22 0100  WBC  --  9.9  --   CREATININE  --  1.39*  --   LATICACIDVEN 5.2*  --  4.0*    Estimated Creatinine Clearance: 47.5 mL/min (A) (by C-G formula based on SCr of 1.39 mg/dL (H)).    No Known Allergies  Antimicrobials this admission: 3/13 Vancomycin >>  3/13 Zosyn >>   Dose adjustments this admission:  Microbiology results: 3/12 BCx:   Thank you for allowing pharmacy to be a part of this patient's care.  Netta Cedars PharmD 06/30/2022 2:29 AM

## 2022-06-30 NOTE — Progress Notes (Signed)
PHARMACY - PHYSICIAN COMMUNICATION CRITICAL VALUE ALERT - BLOOD CULTURE IDENTIFICATION (BCID)  Catherine Erickson is an 67 y.o. female who presented to [facility] on 06/29/2022 with a chief complaint of abdominal pain, admitted with septic shock and started on Vancomycin and Zosyn.    Assessment:  Blood cultures show 1/4 bottles with GNR.  BCID is Klebsiella pneumoniae, (ESBL not detected).  Name of physician (or Provider) Contacted: Dr Hollice Gong  Current antibiotics: Vancomycin, Zosyn  Changes to prescribed antibiotics recommended:  Narrow to Ceftriaxone 2 GM IV q24h + metronidazole for intra-abdominal anaerobic coverage.   Recommendations accepted by provider  Results for orders placed or performed during the hospital encounter of 06/29/22  Blood Culture ID Panel (Reflexed) (Collected: 06/29/2022 10:55 PM)  Result Value Ref Range   Enterococcus faecalis NOT DETECTED NOT DETECTED   Enterococcus Faecium NOT DETECTED NOT DETECTED   Listeria monocytogenes NOT DETECTED NOT DETECTED   Staphylococcus species NOT DETECTED NOT DETECTED   Staphylococcus aureus (BCID) NOT DETECTED NOT DETECTED   Staphylococcus epidermidis NOT DETECTED NOT DETECTED   Staphylococcus lugdunensis NOT DETECTED NOT DETECTED   Streptococcus species NOT DETECTED NOT DETECTED   Streptococcus agalactiae NOT DETECTED NOT DETECTED   Streptococcus pneumoniae NOT DETECTED NOT DETECTED   Streptococcus pyogenes NOT DETECTED NOT DETECTED   A.calcoaceticus-baumannii NOT DETECTED NOT DETECTED   Bacteroides fragilis NOT DETECTED NOT DETECTED   Enterobacterales DETECTED (A) NOT DETECTED   Enterobacter cloacae complex NOT DETECTED NOT DETECTED   Escherichia coli NOT DETECTED NOT DETECTED   Klebsiella aerogenes NOT DETECTED NOT DETECTED   Klebsiella oxytoca NOT DETECTED NOT DETECTED   Klebsiella pneumoniae DETECTED (A) NOT DETECTED   Proteus species NOT DETECTED NOT DETECTED   Salmonella species NOT DETECTED NOT DETECTED    Serratia marcescens NOT DETECTED NOT DETECTED   Haemophilus influenzae NOT DETECTED NOT DETECTED   Neisseria meningitidis NOT DETECTED NOT DETECTED   Pseudomonas aeruginosa NOT DETECTED NOT DETECTED   Stenotrophomonas maltophilia NOT DETECTED NOT DETECTED   Candida albicans NOT DETECTED NOT DETECTED   Candida auris NOT DETECTED NOT DETECTED   Candida glabrata NOT DETECTED NOT DETECTED   Candida krusei NOT DETECTED NOT DETECTED   Candida parapsilosis NOT DETECTED NOT DETECTED   Candida tropicalis NOT DETECTED NOT DETECTED   Cryptococcus neoformans/gattii NOT DETECTED NOT DETECTED   CTX-M ESBL NOT DETECTED NOT DETECTED   Carbapenem resistance IMP NOT DETECTED NOT DETECTED   Carbapenem resistance KPC NOT DETECTED NOT DETECTED   Carbapenem resistance NDM NOT DETECTED NOT DETECTED   Carbapenem resist OXA 48 LIKE NOT DETECTED NOT DETECTED   Carbapenem resistance VIM NOT DETECTED NOT DETECTED    Gretta Arab PharmD, BCPS [facility] main pharmacy [phone] 06/30/2022 3:18 PM

## 2022-06-30 NOTE — ED Notes (Signed)
ED TO INPATIENT HANDOFF REPORT  ED Nurse Name and Phone #: Ronalee Belts [phone]  S Name/Age/Gender Catherine Erickson 67 y.o. female Room/Bed: WA14/WA14  Code Status   Code Status: Full Code  Home/SNF/Other Home Patient oriented to: self, place, time, and situation Is this baseline? Yes   Triage Complete: Triage complete  Chief Complaint Abdominal pain [R10.9]  Triage Note BIB EMS from home.  Called out for abdominal pain and diarrhea since this morning.  Has tried pepto bismol, tylenol, ibuprofen, and 1 dose of immodium.  Has not had any improvement.  Body aches, reported fever.  Feels hot to touch.  Chills.  CBG 177,  Trach patient. No O2    Allergies No Known Allergies  Level of Care/Admitting Diagnosis ED Disposition     ED Disposition  Admit   Condition  --   Comment  Hospital Area: [place] [100102]  Level of Care: Progressive [102]  Admit to Progressive based on following criteria: MULTISYSTEM THREATS such as stable sepsis, metabolic/electrolyte imbalance with or without encephalopathy that is responding to early treatment.  May admit patient to [facility] or [facility] if equivalent level of care is available:: No  Covid Evaluation: Asymptomatic - no recent exposure (last 10 days) testing not required  Diagnosis: Abdominal pain [id number]  Admitting Physician: Rhetta Mura [id number]  Attending Physician: Rhetta Mura [id number]  Certification:: I certify this patient will need inpatient services for at least 2 midnights  Estimated Length of Stay: 2          B Medical/Surgery History Past Medical History:  Diagnosis Date   Cancer [facility])    Past Surgical History:  Procedure Laterality Date   ESOPHAGOSCOPY Right 10/02/2016   Procedure: ESOPHAGOSCOPY;  Surgeon: Carol Ada, MD;  Location: [place];  Service: Endoscopy;  Laterality: Right;   IR FLUORO GUIDE CV LINE RIGHT  10/22/2016   TRACHEOSTOMY N/A    VIDEO ASSISTED THORACOSCOPY  (VATS)/EMPYEMA Right 10/15/2016   Procedure: THOROCOTOMY DRAINAGE OF MEDIASTINAL ABSCESS AND EMPYEMA;  Surgeon: Gaye Pollack, MD;  Location: [place] [facility];  Service: Thoracic;  Laterality: Right;   VIDEO BRONCHOSCOPY N/A 10/02/2016   Procedure: VIDEO BRONCHOSCOPY;  Surgeon: Melrose Nakayama, MD;  Location: [place];  Service: Thoracic;  Laterality: N/A;     A IV Location/Drains/Wounds Patient Lines/Drains/Airways Status     Active Line/Drains/Airways     Name Placement date Placement time Site Days   Peripheral IV 06/29/22 20 G 1" Left;Posterior Forearm 06/29/22  2244  Forearm  1   Peripheral IV 06/30/22 20 G 1" Anterior;Left;Proximal Forearm 06/30/22  0139  Forearm  less than 1   PICC Double Lumen 10/22/16 Midline Right Basilic 29 cm [id number]  [id number]  -- 2077   Incision (Closed) 10/15/16 Chest Right 10/15/16  2241  -- 2084   Tracheostomy [place] 6 mm Uncuffed 10/17/16  1450  6 mm  2082   Tracheostomy [place]iley Flexible 6 mm Uncuffed 06/30/22  0945  6 mm  less than 1            Intake/Output Last 24 hours  Intake/Output Summary (Last 24 hours) at 06/30/2022 1150 Last data filed at 06/30/2022 [id number] Gross per 24 hour  Intake 3306.86 ml  Output --  Net 3306.86 ml    Labs/Imaging Results for orders placed or performed during the hospital encounter of 06/29/22 (from the past 48 hour(s))  Lipase, blood     Status: None   Collection Time: 06/29/22 12:42 AM  Result Value Ref Range   Lipase 27 11 - 51 U/L    Comment: Performed at [facility], [place] [address], [place], [place] [address]  Lactic acid, plasma     Status: Abnormal   Collection Time: 06/29/22 10:41 PM  Result Value Ref Range   Lactic Acid, Venous 5.2 (HH) 0.5 - 1.9 mmol/L    Comment: CRITICAL RESULT CALLED TO, READ BACK BY AND VERIFIED WITH C.BERRIER, RN AT 2353 ON 03.12.24 BY N.THOMPSON Performed at [facility], [place] [address] [place], [place] [address]   Comprehensive metabolic panel      Status: Abnormal   Collection Time: 06/29/22 10:42 PM  Result Value Ref Range   Sodium 133 (L) 135 - 145 mmol/L   Potassium 2.7 (LL) 3.5 - 5.1 mmol/L    Comment: CRITICAL RESULT CALLED TO, READ BACK BY AND VERIFIED WITH C.BERRIER, RN AT 2353 ON 03.12.24 BY N.THOMPSON    Chloride 99 98 - 111 mmol/L   CO2 23 22 - 32 mmol/L   Glucose, Bld 169 (H) 70 - 99 mg/dL    Comment: Glucose reference range applies only to samples taken after fasting for at least 8 hours.   BUN 12 8 - 23 mg/dL   Creatinine, Ser 1.39 (H) 0.44 - 1.00 mg/dL   Calcium 9.0 8.9 - 10.3 mg/dL   Total Protein 7.9 6.5 - 8.1 g/dL   Albumin 3.6 3.5 - 5.0 g/dL   AST 548 (H) 15 - 41 U/L   ALT 307 (H) 0 - 44 U/L   Alkaline Phosphatase 225 (H) 38 - 126 U/L   Total Bilirubin 4.2 (H) 0.3 - 1.2 mg/dL   GFR, Estimated 42 (L) >60 mL/min    Comment: (NOTE) Calculated using the CKD-EPI Creatinine Equation (2021)    Anion gap 11 5 - 15    Comment: Performed at [facility], [place] [address] [place], [place] [address]  CBC with Differential     Status: Abnormal   Collection Time: 06/29/22 10:42 PM  Result Value Ref Range   WBC 9.9 4.0 - 10.5 K/uL   RBC 5.01 3.87 - 5.11 MIL/uL   Hemoglobin 14.0 12.0 - 15.0 g/dL   HCT 40.4 36.0 - 46.0 %   MCV 80.6 80.0 - 100.0 fL   MCH 27.9 26.0 - 34.0 pg   MCHC 34.7 30.0 - 36.0 g/dL   RDW 13.9 11.5 - 15.5 %   Platelets 323 150 - 400 K/uL   nRBC 0.0 0.0 - 0.2 %   Neutrophils Relative % 95 %   Neutro Abs 9.3 (H) 1.7 - 7.7 K/uL   Lymphocytes Relative 4 %   Lymphs Abs 0.4 (L) 0.7 - 4.0 K/uL   Monocytes Relative 1 %   Monocytes Absolute 0.1 0.1 - 1.0 K/uL   Eosinophils Relative 0 %   Eosinophils Absolute 0.0 0.0 - 0.5 K/uL   Basophils Relative 0 %   Basophils Absolute 0.0 0.0 - 0.1 K/uL   WBC Morphology DOHLE BODIES     Comment: VACUOLATED NEUTROPHILS   Immature Granulocytes 0 %   Abs Immature Granulocytes 0.02 0.00 - 0.07 K/uL    Comment: Performed at [facility], [place] [address] [place], [place] [address]  Protime-INR     Status: None   Collection Time: 06/29/22 10:42 PM  Result Value Ref Range   Prothrombin Time 14.2 11.4 - 15.2 seconds   INR 1.1 0.8 - 1.2  Comment: (NOTE) INR goal varies based on device and disease states. Performed at [facility], [place] [address], [place], [place] [address]   Culture, blood (Routine x 2)     Status: None (Preliminary result)   Collection Time: 06/29/22 10:55 PM   Specimen: BLOOD LEFT WRIST  Result Value Ref Range   Specimen Description      BLOOD LEFT WRIST Performed at [place] [address] [place], [place] [address]    Special Requests      BOTTLES DRAWN AEROBIC AND ANAEROBIC Blood Culture adequate volume Performed at [place] [address] [place], [place] [address]    Culture      NO GROWTH < 12 HOURS Performed at [place] [address], [place], [place] [address]    Report Status PENDING   Culture, blood (Routine x 2)     Status: None (Preliminary result)   Collection Time: 06/29/22 10:55 PM   Specimen: BLOOD  Result Value Ref Range   Specimen Description      BLOOD LEFT ANTECUBITAL Performed at [facility], [place] [address] [place], [place] [address]    Special Requests      BOTTLES DRAWN AEROBIC AND ANAEROBIC Blood Culture results may not be optimal due to an inadequate volume of blood received in culture bottles Performed at [facility], [place] [address] [place], [place] [address]    Culture      NO GROWTH < 12 HOURS Performed at [place] [address] [place], [place] [address]    Report Status PENDING   Magnesium     Status: None   Collection Time: 06/30/22 12:42 AM  Result Value Ref Range   Magnesium 1.8 1.7 - 2.4 mg/dL    Comment: Performed at [facility], [place] [address], [place], [place] [address]  Lactic acid,  plasma     Status: Abnormal   Collection Time: 06/30/22  1:00 AM  Result Value Ref Range   Lactic Acid, Venous 4.0 (HH) 0.5 - 1.9 mmol/L    Comment: CRITICAL VALUE NOTED. VALUE IS CONSISTENT WITH PREVIOUSLY REPORTED/CALLED VALUE Performed at [place] [address], [place], [place] [address]   Urinalysis, Routine w reflex microscopic -Urine, Clean Catch     Status: Abnormal   Collection Time: 06/30/22  1:09 AM  Result Value Ref Range   Color, Urine YELLOW YELLOW   APPearance CLEAR CLEAR   Specific Gravity, Urine 1.010 1.005 - 1.030   pH 7.0 5.0 - 8.0   Glucose, UA NEGATIVE NEGATIVE mg/dL   Hgb urine dipstick NEGATIVE NEGATIVE   Bilirubin Urine MODERATE (A) NEGATIVE   Ketones, ur NEGATIVE NEGATIVE mg/dL   Protein, ur 30 (A) NEGATIVE mg/dL   Nitrite NEGATIVE NEGATIVE   Leukocytes,Ua NEGATIVE NEGATIVE   RBC / HPF 0-5 0 - 5 RBC/hpf   WBC, UA 0-5 0 - 5 WBC/hpf   Bacteria, UA RARE (A) NONE SEEN   Squamous Epithelial / HPF 0-5 0 - 5 /HPF   Mucus PRESENT    Hyaline Casts, UA PRESENT     Comment: Performed at [facility], [place] [address], [place],  [address]  Bilirubin, direct     Status: Abnormal   Collection Time: 06/30/22  4:27 AM  Result Value Ref Range   Bilirubin, Direct 2.7 (H) 0.0 - 0.2 mg/dL    Comment: Performed at [facility], [place] [address] [place], [place] [address]  Gamma GT     Status: Abnormal   Collection Time: 06/30/22  4:27 AM  Result Value Ref Range   GGT 178 (H) 7 - 50 U/L    Comment: Performed at [facility], [place] [address], [place], [place] [address]  CBC with Differential/Platelet     Status: Abnormal   Collection Time: 06/30/22  4:27 AM  Result Value Ref Range   WBC 31.5 (H) 4.0 - 10.5 K/uL   RBC 4.17 3.87 - 5.11 MIL/uL   Hemoglobin 11.4 (L) 12.0 - 15.0 g/dL   HCT 34.3 (L) 36.0 - 46.0 %   MCV 82.3 80.0 - 100.0 fL   MCH 27.3 26.0 - 34.0 pg   MCHC 33.2 30.0 - 36.0 g/dL   RDW  14.5 11.5 - 15.5 %   Platelets 257 150 - 400 K/uL   nRBC 0.0 0.0 - 0.2 %   Neutrophils Relative % 92 %   Neutro Abs 28.7 (H) 1.7 - 7.7 K/uL   Lymphocytes Relative 2 %   Lymphs Abs 0.7 0.7 - 4.0 K/uL   Monocytes Relative 4 %   Monocytes Absolute 1.2 (H) 0.1 - 1.0 K/uL   Eosinophils Relative 0 %   Eosinophils Absolute 0.0 0.0 - 0.5 K/uL   Basophils Relative 0 %   Basophils Absolute 0.1 0.0 - 0.1 K/uL   WBC Morphology VACUOLATED NEUTROPHILS    Immature Granulocytes 2 %   Abs Immature Granulocytes 0.76 (H) 0.00 - 0.07 K/uL    Comment: Performed at [facility], [place] [address] [place], [place] [address]  Comprehensive metabolic panel     Status: Abnormal   Collection Time: 06/30/22  4:27 AM  Result Value Ref Range   Sodium 136 135 - 145 mmol/L   Potassium 2.9 (L) 3.5 - 5.1 mmol/L   Chloride 100 98 - 111 mmol/L   CO2 24 22 - 32 mmol/L   Glucose, Bld 157 (H) 70 - 99 mg/dL    Comment: Glucose reference range applies only to samples taken after fasting for at least 8 hours.   BUN 15 8 - 23 mg/dL   Creatinine, Ser 1.53 (H) 0.44 - 1.00 mg/dL   Calcium 8.2 (L) 8.9 - 10.3 mg/dL   Total Protein 6.5 6.5 - 8.1 g/dL   Albumin 2.8 (L) 3.5 - 5.0 g/dL   AST 320 (H) 15 - 41 U/L   ALT 238 (H) 0 - 44 U/L   Alkaline Phosphatase 148 (H) 38 - 126 U/L   Total Bilirubin 4.2 (H) 0.3 - 1.2 mg/dL   GFR, Estimated 38 (L) >60 mL/min    Comment: (NOTE) Calculated using the CKD-EPI Creatinine Equation (2021)    Anion gap 12 5 - 15    Comment: Performed at [facility], [place] [address] [place], [place] [address]  Magnesium     Status: Abnormal   Collection Time: 06/30/22  4:27 AM  Result Value Ref Range   Magnesium 1.6 (L) 1.7 - 2.4 mg/dL    Comment: Performed at [facility], [place] [address] [place], [place] [address]  Lactic acid, plasma     Status: Abnormal   Collection Time: 06/30/22  8:51 AM  Result Value Ref Range   Lactic Acid, Venous 3.6  (HH) 0.5 - 1.9 mmol/L    Comment: CRITICAL VALUE NOTED. VALUE IS CONSISTENT WITH PREVIOUSLY REPORTED/CALLED VALUE Performed at [place]  [address], [place], [place] [address]    CT ABDOMEN PELVIS W CONTRAST  Result Date: 06/30/2022 CLINICAL DATA:  Abdominal pain and diarrhea for 1 day. EXAM: CT ABDOMEN AND PELVIS WITH CONTRAST TECHNIQUE: Multidetector CT imaging of the abdomen and pelvis was performed using the standard protocol following bolus administration of intravenous contrast. RADIATION DOSE REDUCTION: This exam was performed according to the departmental dose-optimization program which includes automated exposure control, adjustment of the mA and/or kV according to patient size and/or use of iterative reconstruction technique. CONTRAST:  66m OMNIPAQUE IOHEXOL 300 MG/ML  SOLN COMPARISON:  12/05/2016. FINDINGS: Lower chest: The heart is mildly enlarged. Bronchial wall thickening is noted in the lower lobes bilaterally and scattered airspace opacities are present at the lung bases. Hepatobiliary: No focal liver abnormality is seen. Stones are present in the gallbladder. No biliary ductal dilatation. Pancreas: Unremarkable. No pancreatic ductal dilatation or surrounding inflammatory changes. Spleen: Normal in size without focal abnormality. Adrenals/Urinary Tract: The adrenal glands are within normal limits. A vague hypodensity is present in the upper pole of the left kidney. Renal cortical scarring is present bilaterally. Cysts are noted in the right kidney. No renal calculus or hydronephrosis. The bladder is unremarkable. Stomach/Bowel: Stomach is within normal limits. Appendix appears normal. There is focal dilatation of the duodenum with normal caliber of the remaining small bowel. No focal bowel wall thickening or surrounding inflammatory changes. No free air or pneumatosis. Vascular/Lymphatic: Aortic atherosclerosis. No enlarged abdominal or pelvic lymph nodes.  Reproductive: Uterus and bilateral adnexa are unremarkable. Other: No abdominopelvic ascites. Musculoskeletal: Degenerative changes are present in the thoracolumbar spine. There is sclerosis at the sacroiliac joints bilaterally, compatible with sacroiliitis. IMPRESSION: 1. No abnormality to explain patient's reported diarrhea. 2. Bronchial wall thickening in the lower lobes bilaterally with patchy airspace disease at the lung bases, concerning for pneumonia. 3. Cholelithiasis. 4. Vague hypodensity in the upper pole of the left kidney, may represent pyelonephritis in the appropriate clinical setting. 5. Aortic atherosclerosis. Electronically Signed   By: LBrett FairyM.D.   On: 06/30/2022 01:27   UKoreaAbdomen Limited  Result Date: 06/29/2022 CLINICAL DATA:  Right upper quadrant pain. EXAM: ULTRASOUND ABDOMEN LIMITED RIGHT UPPER QUADRANT COMPARISON:  12/05/2016. FINDINGS: Gallbladder: Stones are present within the gallbladder. No gallbladder wall thickening. No sonographic Murphy sign noted by sonographer. Common bile duct: Diameter: 4.1 mm Liver: No focal lesion identified. Within normal limits in parenchymal echogenicity. Portal vein is patent on color Doppler imaging with normal direction of blood flow towards the liver. Other: No ascites. IMPRESSION: Cholelithiasis without acute cholecystitis. Electronically Signed   By: LBrett FairyM.D.   On: 06/29/2022 23:30   DG Chest Port 1 View  Result Date: 06/29/2022 CLINICAL DATA:  Shortness of breath EXAM: PORTABLE CHEST 1 VIEW COMPARISON:  12/01/2016 FINDINGS: Low lung volumes accentuate cardiomediastinal silhouette and pulmonary vascularity. Bibasilar atelectasis. Stable cardiomediastinal silhouette. Tracheostomy. IMPRESSION: Low lung volumes with bibasilar atelectasis. Electronically Signed   By: TPlacido SouM.D.   On: 06/29/2022 22:59    Pending Labs Unresulted Labs (From admission, onward)     Start     Ordered   07/01/22 0500  CBC  Daily,   R       06/30/22 0746   07/01/22 [id number] Basic metabolic panel  Daily,   R      06/30/22 0746   06/30/22 1013  MRSA Next Gen by PCR, Nasal  (MRSA Screening)  Once,   R  06/30/22 1012   06/30/22 0748  Lactic acid, plasma  Now then every 4 hours,   R (with TIMED occurrences)      06/30/22 0747            Vitals/Pain Today's Vitals   06/30/22 1000 06/30/22 1015 06/30/22 1100 06/30/22 1131  BP: 121/67 127/64 127/64   Pulse: 86 85 85 82  Resp:  '16 16 16  '$ Temp:      TempSrc:      SpO2: 97% 95% 95% 96%  Weight:      Height:      PainSc:        Isolation Precautions No active isolations  Medications Medications  acetaminophen (TYLENOL) tablet 650 mg (has no administration in time range)    Or  acetaminophen (TYLENOL) suppository 650 mg (has no administration in time range)  melatonin tablet 3 mg (has no administration in time range)  ondansetron (ZOFRAN) injection 4 mg (has no administration in time range)  lactated ringers infusion ( Intravenous Infusion Verify 06/30/22 0653)  naloxone (NARCAN) injection 0.4 mg (has no administration in time range)  HYDROmorphone (DILAUDID) injection 0.5 mg (has no administration in time range)  piperacillin-tazobactam (ZOSYN) IVPB 3.375 g (3.375 g Intravenous New Bag/Given 06/30/22 0845)  vancomycin (VANCOREADY) IVPB 1250 mg/250 mL (has no administration in time range)  HYDROcodone-acetaminophen (NORCO/VICODIN) 5-325 MG per tablet 1 tablet (has no administration in time range)  cyclobenzaprine (FLEXERIL) tablet 10 mg (has no administration in time range)  potassium chloride 10 mEq in 100 mL IVPB (10 mEq Intravenous New Bag/Given 06/30/22 1148)  heparin injection 5,000 Units (has no administration in time range)  HYDROmorphone (DILAUDID) injection 1 mg (1 mg Intravenous Given 06/29/22 2306)  ondansetron (ZOFRAN) injection 4 mg (4 mg Intravenous Given 06/29/22 2306)  sodium chloride 0.9 % bolus 1,000 mL (0 mLs Intravenous Stopped 06/30/22 0045)   sodium chloride 0.9 % bolus 1,000 mL (0 mLs Intravenous Stopped 06/30/22 0226)  potassium chloride 10 mEq in 100 mL IVPB (0 mEq Intravenous Stopped 06/30/22 0434)  iohexol (OMNIPAQUE) 300 MG/ML solution 75 mL (75 mLs Intravenous Contrast Given 06/30/22 0034)  piperacillin-tazobactam (ZOSYN) IVPB 3.375 g (0 g Intravenous Stopped 06/30/22 0226)  vancomycin (VANCOREADY) IVPB 2000 mg/400 mL (0 mg Intravenous Stopped 06/30/22 0401)  HYDROmorphone (DILAUDID) injection 0.5 mg (0.5 mg Intravenous Given 06/30/22 0119)  magnesium sulfate IVPB 2 g 50 mL (0 g Intravenous Stopped 06/30/22 0942)    Mobility walks with person assist     Focused Assessments Ab pain   R Recommendations: See Admitting Provider Note  Report given to:   Additional Notes: .

## 2022-07-01 ENCOUNTER — Encounter (HOSPITAL_COMMUNITY): Payer: Self-pay | Admitting: Internal Medicine

## 2022-07-01 DIAGNOSIS — A419 Sepsis, unspecified organism: Secondary | ICD-10-CM | POA: Diagnosis not present

## 2022-07-01 DIAGNOSIS — R6521 Severe sepsis with septic shock: Secondary | ICD-10-CM | POA: Diagnosis not present

## 2022-07-01 LAB — HEPATITIS PANEL, ACUTE
HCV Ab: NONREACTIVE
Hep A IgM: NONREACTIVE
Hep B C IgM: NONREACTIVE
Hepatitis B Surface Ag: NONREACTIVE

## 2022-07-01 LAB — CBC
HCT: 36.5 % (ref 36.0–46.0)
Hemoglobin: 12.1 g/dL (ref 12.0–15.0)
MCH: 27.7 pg (ref 26.0–34.0)
MCHC: 33.2 g/dL (ref 30.0–36.0)
MCV: 83.5 fL (ref 80.0–100.0)
Platelets: 262 10*3/uL (ref 150–400)
RBC: 4.37 MIL/uL (ref 3.87–5.11)
RDW: 15 % (ref 11.5–15.5)
WBC: 18.5 10*3/uL — ABNORMAL HIGH (ref 4.0–10.5)
nRBC: 0 % (ref 0.0–0.2)

## 2022-07-01 LAB — BASIC METABOLIC PANEL
Anion gap: 10 (ref 5–15)
BUN: 14 mg/dL (ref 8–23)
CO2: 27 mmol/L (ref 22–32)
Calcium: 8.7 mg/dL — ABNORMAL LOW (ref 8.9–10.3)
Chloride: 103 mmol/L (ref 98–111)
Creatinine, Ser: 1.32 mg/dL — ABNORMAL HIGH (ref 0.44–1.00)
GFR, Estimated: 45 mL/min — ABNORMAL LOW (ref >60)
Glucose, Bld: 130 mg/dL — ABNORMAL HIGH (ref 70–99)
Potassium: 3 mmol/L — ABNORMAL LOW (ref 3.5–5.1)
Sodium: 140 mmol/L (ref 135–145)

## 2022-07-01 LAB — LIPASE, BLOOD: Lipase: 23 U/L (ref 11–51)

## 2022-07-01 LAB — CULTURE, BLOOD (ROUTINE X 2): Special Requests: ADEQUATE

## 2022-07-01 LAB — HEPATIC FUNCTION PANEL
ALT: 155 U/L — ABNORMAL HIGH (ref 0–44)
AST: 118 U/L — ABNORMAL HIGH (ref 15–41)
Albumin: 2.8 g/dL — ABNORMAL LOW (ref 3.5–5.0)
Alkaline Phosphatase: 155 U/L — ABNORMAL HIGH (ref 38–126)
Bilirubin, Direct: 2.3 mg/dL — ABNORMAL HIGH (ref 0.0–0.2)
Indirect Bilirubin: 1.4 mg/dL — ABNORMAL HIGH (ref 0.3–0.9)
Total Bilirubin: 3.7 mg/dL — ABNORMAL HIGH (ref 0.3–1.2)
Total Protein: 6.9 g/dL (ref 6.5–8.1)

## 2022-07-01 LAB — BRAIN NATRIURETIC PEPTIDE: B Natriuretic Peptide: 300.7 pg/mL — ABNORMAL HIGH (ref 0.0–100.0)

## 2022-07-01 LAB — PROCALCITONIN: Procalcitonin: 23.68 ng/mL

## 2022-07-01 MED ORDER — LACTATED RINGERS IV SOLN: INTRAVENOUS | Status: AC

## 2022-07-01 MED ORDER — IPRATROPIUM-ALBUTEROL 0.5-2.5 (3) MG/3ML IN SOLN
3.0000 mL | Freq: Four times a day (QID) | RESPIRATORY_TRACT | Status: DC
  Administered 2022-07-01 – 2022-07-02 (×6): 3 mL via RESPIRATORY_TRACT
  Filled 2022-07-01 (×6): qty 3

## 2022-07-01 MED ORDER — METOPROLOL TARTRATE 5 MG/5ML IV SOLN: 5.0000 mg | INTRAVENOUS | Status: DC | PRN

## 2022-07-01 MED ORDER — IPRATROPIUM-ALBUTEROL 0.5-2.5 (3) MG/3ML IN SOLN: 3.0000 mL | RESPIRATORY_TRACT | Status: DC | PRN

## 2022-07-01 MED ORDER — SENNOSIDES-DOCUSATE SODIUM 8.6-50 MG PO TABS: 1.0000 | ORAL_TABLET | Freq: Every evening | ORAL | Status: DC | PRN

## 2022-07-01 MED ORDER — AMLODIPINE BESYLATE 10 MG PO TABS
10.0000 mg | ORAL_TABLET | Freq: Every day | ORAL | Status: DC
  Administered 2022-07-01 – 2022-07-06 (×6): 10 mg via ORAL
  Filled 2022-07-01 (×6): qty 1

## 2022-07-01 MED ORDER — TRAZODONE HCL 50 MG PO TABS: 50.0000 mg | ORAL_TABLET | Freq: Every evening | ORAL | Status: DC | PRN

## 2022-07-01 MED ORDER — GUAIFENESIN 100 MG/5ML PO LIQD: 5.0000 mL | ORAL | Status: DC | PRN

## 2022-07-01 MED ORDER — POTASSIUM CHLORIDE 20 MEQ PO PACK
60.0000 meq | PACK | Freq: Once | ORAL | Status: AC
  Administered 2022-07-01: 60 meq via ORAL
  Filled 2022-07-01: qty 3

## 2022-07-01 MED ORDER — HYDRALAZINE HCL 20 MG/ML IJ SOLN
10.0000 mg | INTRAMUSCULAR | Status: DC | PRN
  Administered 2022-07-02: 10 mg via INTRAVENOUS
  Filled 2022-07-01: qty 1

## 2022-07-01 NOTE — Progress Notes (Signed)
RT instructed patient on the use of a flutter valve and incentive spirometer per MD order. Patient able to demonstrate back good technique on both devices. With the incentive spirometer the patient is not able to get volume due to her trach. The patient is able to feel vibration in her chest when she uses the flutter valve.

## 2022-07-01 NOTE — Progress Notes (Signed)
PROGRESS NOTE    Catherine Erickson  [id number] DOB: 04-30-1955 DOA: 06/29/2022 PCP: Pcp, No   Brief Narrative:  67 year old with history of trach dependence, chronic pain, HTN, HLD admitted to the hospital for SIRS.  Patient able to communicate effectively but had difficulty speaking.  She started experiencing epigastric abdominal pain day prior to admission.  Upon admission patient was started on broad-spectrum antibiotics.   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  CT abdomen pelvis shows questionable pyelonephritis but UA is negative.  There is evidence of possible pneumonia.  Will check procalcitonin, BNP. - Broad-spectrum antibiotics, will further tailor as necessary - Check MRSA swab - Blood pressure appears to be improving, WBC is trending down. - Diet as tolerated, on IV fluids - Bronchodilators scheduled and as needed  Transaminitis - LFTs elevated yesterday including total bilirubin.  Right upper quadrant ultrasound shows cholelithiasis but no evidence of acute cholecystitis.  Will repeat LFTs and obtain HIDA scan if necessary - Check acute hepatitis panel.  Repeat LFTs this morning - Check lipase  Hypokalemia - Aggressive repletion  CKD stage IIIb - Creatinine around baseline of 1.4.  Essential hypertension - Significantly elevated this morning, resume Norvasc.  Tracheostomy dependence ([place]) -Routine care  Hyperlipidemia - Hold statin while LFTs are elevated  DVT prophylaxis: SQ Heparin Code Status: Full Family Communication:    Status is: Inpatient Continue hospital stay due to multiple ongoing  issues.  Subjective: Seen and examined at bedside, feels better no complaints.  Tells me she does not have any abdominal pain this morning.  She is coughing some   Examination:  Constitutional: Not in acute distress Respiratory: Some rhonchi Cardiovascular: Normal sinus rhythm, no rubs Abdomen: Nontender nondistended good bowel sounds Musculoskeletal: No edema noted Skin: No rashes seen Neurologic: CN 2-12 grossly intact.  And nonfocal Psychiatric: Normal judgment and insight. Alert and oriented x 3. Normal mood.  Chronic trach in place  Objective: Vitals:   07/01/22 0310 07/01/22 0400 07/01/22 0500 07/01/22 0600  BP: (!) 171/70 (!) 163/72 (!) 130/107 (!) 145/65  Pulse: 92 93 91 92  Resp: (!) 21 (!) 24 (!) 22 (!) 22  Temp: 98.5 F (36.9 C)     TempSrc: Oral     SpO2: 97% 98% 98% 100%  Weight:      Height:        Intake/Output Summary (Last 24 hours) at 07/01/2022 0739 Last data filed at 07/01/2022 0600 Gross per 24 hour  Intake 2700.87 ml  Output 2500 ml  Net 200.87 ml   Filed Weights   06/30/22 0044 06/30/22 1238  Weight: 108.9 kg 109.2 kg     Data Reviewed:   CBC: Recent Labs  Lab 06/29/22 2242 06/30/22 0427 07/01/22 0323  WBC 9.9 31.5* 18.5*  NEUTROABS 9.3* 28.7*  --   HGB 14.0 11.4* 12.1  HCT 40.4 34.3* 36.5  MCV 80.6 82.3 83.5  PLT 323 257 [id number]   Basic Metabolic Panel: Recent Labs  Lab 06/29/22 2242 06/30/22 0042 06/30/22 0427 07/01/22 0323  NA 133*  --  136 140  K 2.7*  --  2.9* 3.0*  CL 99  --  100 103  CO2 23  --  24 27  GLUCOSE 169*  --  157* 130*  BUN 12  --  15 14  CREATININE 1.39*  --  1.53* 1.32*  CALCIUM 9.0  --  8.2* 8.7*  MG  --  1.8 1.6*  --    GFR: Estimated Creatinine Clearance: 52 mL/min (A) (by C-G formula based on SCr of 1.32 mg/dL (H)). Liver Function Tests: Recent Labs  Lab 06/29/22 2242 06/30/22 0427  AST 548* 320*  ALT 307* 238*  ALKPHOS 225* 148*  BILITOT 4.2* 4.2*  PROT 7.9 6.5  ALBUMIN 3.6 2.8*    Recent Labs  Lab 06/29/22 0042  LIPASE 27   No results for input(s): "AMMONIA" in the last 168 hours. Coagulation Profile: Recent Labs  Lab 06/29/22 2242  INR 1.1   Cardiac Enzymes: No results for input(s): "CKTOTAL", "CKMB", "CKMBINDEX", "TROPONINI" in the last 168 hours. BNP (last 3 results) No results for input(s): "PROBNP" in the last 8760 hours. HbA1C: No results for input(s): "HGBA1C" in the last 72 hours. CBG: No results for input(s): "GLUCAP" in the last 168 hours. Lipid Profile: No results for input(s): "CHOL", "HDL", "LDLCALC", "TRIG", "CHOLHDL", "LDLDIRECT" in the last 72 hours. Thyroid Function Tests: No results for input(s): "TSH", "T4TOTAL", "FREET4", "T3FREE", "THYROIDAB" in the last 72 hours. Anemia Panel: No results for input(s): "VITAMINB12", "FOLATE", "FERRITIN", "TIBC", "IRON", "RETICCTPCT" in the last 72 hours. Sepsis Labs: Recent Labs  Lab 06/30/22 0100 06/30/22 0851 06/30/22 1240 06/30/22 1541  LATICACIDVEN 4.0* 3.6* 1.9 1.4    Recent Results (from the past 240 hour(s))  Culture, blood (Routine x 2)     Status: None (Preliminary result)   Collection Time: 06/29/22 10:55 PM   Specimen: BLOOD LEFT WRIST  Result Value Ref Range Status   Specimen Description   Final    BLOOD LEFT WRIST Performed at [facility] [address], [place], [place] [address]    Special Requests   Final    BOTTLES DRAWN AEROBIC AND ANAEROBIC Blood Culture adequate volume Performed at [facility] [address], [place], [place] [address]    Culture  Setup Time   Final    GRAM NEGATIVE RODS AEROBIC BOTTLE ONLY CRITICAL RESULT CALLED TO, READ BACK BY AND VERIFIED WITH: PHARMD MICHELLE LILLISTON ON 06/30/22 @ 2321 BY DRT Performed at [facility], [place] [address], [place], [place] [address]    Culture GRAM NEGATIVE RODS  Final   Report Status PENDING  Incomplete  Culture, blood (Routine x 2)     Status: None  (Preliminary result)   Collection Time: 06/29/22 10:55 PM   Specimen: BLOOD  Result Value Ref Range Status   Specimen Description   Final    BLOOD LEFT ANTECUBITAL Performed at [facility], [place] [address], [place], [place] [address]    Special Requests   Final    BOTTLES DRAWN AEROBIC AND ANAEROBIC Blood Culture results may not be optimal due to an inadequate volume of blood received in culture bottles Performed at [facility] [address], [place], [place] [address]    Culture  Setup Time   Final    GRAM NEGATIVE RODS AEROBIC BOTTLE ONLY CRITICAL RESULT CALLED TO, READ BACK BY AND VERIFIED WITH: [place] [id number] BY JRS Performed at [facility], [place] [address], [place], [place] [address]    Culture GRAM NEGATIVE RODS  Final   Report  Status PENDING  Incomplete  Blood Culture ID Panel (Reflexed)     Status: Abnormal   Collection Time: 06/29/22 10:55 PM  Result Value Ref Range Status   Enterococcus faecalis NOT DETECTED NOT DETECTED Final   Enterococcus Faecium NOT DETECTED NOT DETECTED Final   Listeria monocytogenes NOT DETECTED NOT DETECTED Final   Staphylococcus species NOT DETECTED NOT DETECTED Final   Staphylococcus aureus (BCID) NOT DETECTED NOT DETECTED Final   Staphylococcus epidermidis NOT DETECTED NOT DETECTED Final   Staphylococcus lugdunensis NOT DETECTED NOT DETECTED Final   Streptococcus species NOT DETECTED NOT DETECTED Final   Streptococcus agalactiae NOT DETECTED NOT DETECTED Final   Streptococcus pneumoniae NOT DETECTED NOT DETECTED Final   Streptococcus pyogenes NOT DETECTED NOT DETECTED Final   A.calcoaceticus-baumannii NOT DETECTED NOT DETECTED Final   Bacteroides fragilis NOT DETECTED NOT DETECTED Final   Enterobacterales DETECTED (A) NOT DETECTED Final    Comment: Enterobacterales represent a large order of gram negative bacteria, not a single organism. CRITICAL RESULT CALLED TO, READ BACK BY AND  VERIFIED WITH: PHARMD [place] [id number] BY JRS    Enterobacter cloacae complex NOT DETECTED NOT DETECTED Final   Escherichia coli NOT DETECTED NOT DETECTED Final   Klebsiella aerogenes NOT DETECTED NOT DETECTED Final   Klebsiella oxytoca NOT DETECTED NOT DETECTED Final   Klebsiella pneumoniae DETECTED (A) NOT DETECTED Final    Comment: CRITICAL RESULT CALLED TO, READ BACK BY AND VERIFIED WITH: PHARMD CHRISTINE [place]ADE GC:[id number] BY JRS.    Proteus species NOT DETECTED NOT DETECTED Final   Salmonella species NOT DETECTED NOT DETECTED Final   Serratia marcescens NOT DETECTED NOT DETECTED Final   Haemophilus influenzae NOT DETECTED NOT DETECTED Final   Neisseria meningitidis NOT DETECTED NOT DETECTED Final   Pseudomonas aeruginosa NOT DETECTED NOT DETECTED Final   Stenotrophomonas maltophilia NOT DETECTED NOT DETECTED Final   Candida albicans NOT DETECTED NOT DETECTED Final   Candida auris NOT DETECTED NOT DETECTED Final   Candida glabrata NOT DETECTED NOT DETECTED Final   Candida krusei NOT DETECTED NOT DETECTED Final   Candida parapsilosis NOT DETECTED NOT DETECTED Final   Candida tropicalis NOT DETECTED NOT DETECTED Final   Cryptococcus neoformans/gattii NOT DETECTED NOT DETECTED Final   CTX-M ESBL NOT DETECTED NOT DETECTED Final   Carbapenem resistance IMP NOT DETECTED NOT DETECTED Final   Carbapenem resistance KPC NOT DETECTED NOT DETECTED Final   Carbapenem resistance NDM NOT DETECTED NOT DETECTED Final   Carbapenem resist OXA 48 LIKE NOT DETECTED NOT DETECTED Final   Carbapenem resistance VIM NOT DETECTED NOT DETECTED Final    Comment: Performed at [facility], [address], [place], [place] [address]  MRSA Next Gen by PCR, Nasal     Status: None   Collection Time: 06/30/22 12:26 PM   Specimen: Nasal Mucosa; Nasal Swab  Result Value Ref Range Status   MRSA by PCR Next Gen NOT DETECTED NOT DETECTED Final    Comment: (NOTE) The GeneXpert MRSA Assay (FDA  approved for NASAL specimens only), is one component of a comprehensive MRSA colonization surveillance program. It is not intended to diagnose MRSA infection nor to guide or monitor treatment for MRSA infections. Test performance is not FDA approved in patients less than 87 years old. Performed at [facility], [place] [address] [place], [place] [address]          Radiology Studies: CT ABDOMEN PELVIS W CONTRAST  Result Date: 06/30/2022 CLINICAL DATA:  Abdominal pain  and diarrhea for 1 day. EXAM: CT ABDOMEN AND PELVIS WITH CONTRAST TECHNIQUE: Multidetector CT imaging of the abdomen and pelvis was performed using the standard protocol following bolus administration of intravenous contrast. RADIATION DOSE REDUCTION: This exam was performed according to the departmental dose-optimization program which includes automated exposure control, adjustment of the mA and/or kV according to patient size and/or use of iterative reconstruction technique. CONTRAST:  35m OMNIPAQUE IOHEXOL 300 MG/ML  SOLN COMPARISON:  12/05/2016. FINDINGS: Lower chest: The heart is mildly enlarged. Bronchial wall thickening is noted in the lower lobes bilaterally and scattered airspace opacities are present at the lung bases. Hepatobiliary: No focal liver abnormality is seen. Stones are present in the gallbladder. No biliary ductal dilatation. Pancreas: Unremarkable. No pancreatic ductal dilatation or surrounding inflammatory changes. Spleen: Normal in size without focal abnormality. Adrenals/Urinary Tract: The adrenal glands are within normal limits. A vague hypodensity is present in the upper pole of the left kidney. Renal cortical scarring is present bilaterally. Cysts are noted in the right kidney. No renal calculus or hydronephrosis. The bladder is unremarkable. Stomach/Bowel: Stomach is within normal limits. Appendix appears normal. There is focal dilatation of the duodenum with normal caliber of the remaining  small bowel. No focal bowel wall thickening or surrounding inflammatory changes. No free air or pneumatosis. Vascular/Lymphatic: Aortic atherosclerosis. No enlarged abdominal or pelvic lymph nodes. Reproductive: Uterus and bilateral adnexa are unremarkable. Other: No abdominopelvic ascites. Musculoskeletal: Degenerative changes are present in the thoracolumbar spine. There is sclerosis at the sacroiliac joints bilaterally, compatible with sacroiliitis. IMPRESSION: 1. No abnormality to explain patient's reported diarrhea. 2. Bronchial wall thickening in the lower lobes bilaterally with patchy airspace disease at the lung bases, concerning for pneumonia. 3. Cholelithiasis. 4. Vague hypodensity in the upper pole of the left kidney, may represent pyelonephritis in the appropriate clinical setting. 5. Aortic atherosclerosis. Electronically Signed   By: LBrett FairyM.D.   On: 06/30/2022 01:27   UKoreaAbdomen Limited  Result Date: 06/29/2022 CLINICAL DATA:  Right upper quadrant pain. EXAM: ULTRASOUND ABDOMEN LIMITED RIGHT UPPER QUADRANT COMPARISON:  12/05/2016. FINDINGS: Gallbladder: Stones are present within the gallbladder. No gallbladder wall thickening. No sonographic Murphy sign noted by sonographer. Common bile duct: Diameter: 4.1 mm Liver: No focal lesion identified. Within normal limits in parenchymal echogenicity. Portal vein is patent on color Doppler imaging with normal direction of blood flow towards the liver. Other: No ascites. IMPRESSION: Cholelithiasis without acute cholecystitis. Electronically Signed   By: LBrett FairyM.D.   On: 06/29/2022 23:30   DG Chest Port 1 View  Result Date: 06/29/2022 CLINICAL DATA:  Shortness of breath EXAM: PORTABLE CHEST 1 VIEW COMPARISON:  12/01/2016 FINDINGS: Low lung volumes accentuate cardiomediastinal silhouette and pulmonary vascularity. Bibasilar atelectasis. Stable cardiomediastinal silhouette. Tracheostomy. IMPRESSION: Low lung volumes with bibasilar  atelectasis. Electronically Signed   By: TPlacido SouM.D.   On: 06/29/2022 22:59        Scheduled Meds:  Chlorhexidine Gluconate Cloth  6 each Topical Daily   heparin injection (subcutaneous)  5,000 Units Subcutaneous Q8H   mouth rinse  15 mL Mouth Rinse 4 times per day   Continuous Infusions:  cefTRIAXone (ROCEPHIN)  IV Stopped (06/30/22 2031)   lactated ringers 125 mL/hr at 07/01/22 0453   metronidazole Stopped (06/30/22 2217)     LOS: 1 day   Time spent= 35 mins    Phyllip Claw CArsenio Loader MD [facility]  If 7PM-7AM, please contact night-coverage  07/01/2022, 7:39 AM

## 2022-07-02 ENCOUNTER — Inpatient Hospital Stay (HOSPITAL_COMMUNITY): Payer: Medicaid Other

## 2022-07-02 DIAGNOSIS — A419 Sepsis, unspecified organism: Secondary | ICD-10-CM | POA: Diagnosis not present

## 2022-07-02 DIAGNOSIS — R6521 Severe sepsis with septic shock: Secondary | ICD-10-CM | POA: Diagnosis not present

## 2022-07-02 LAB — COMPREHENSIVE METABOLIC PANEL
ALT: 121 U/L — ABNORMAL HIGH (ref 0–44)
AST: 83 U/L — ABNORMAL HIGH (ref 15–41)
Albumin: 2.8 g/dL — ABNORMAL LOW (ref 3.5–5.0)
Alkaline Phosphatase: 170 U/L — ABNORMAL HIGH (ref 38–126)
Anion gap: 8 (ref 5–15)
BUN: 10 mg/dL (ref 8–23)
CO2: 25 mmol/L (ref 22–32)
Calcium: 8.5 mg/dL — ABNORMAL LOW (ref 8.9–10.3)
Chloride: 103 mmol/L (ref 98–111)
Creatinine, Ser: 1.1 mg/dL — ABNORMAL HIGH (ref 0.44–1.00)
GFR, Estimated: 56 mL/min — ABNORMAL LOW (ref >60)
Glucose, Bld: 128 mg/dL — ABNORMAL HIGH (ref 70–99)
Potassium: 3.3 mmol/L — ABNORMAL LOW (ref 3.5–5.1)
Sodium: 136 mmol/L (ref 135–145)
Total Bilirubin: 3.1 mg/dL — ABNORMAL HIGH (ref 0.3–1.2)
Total Protein: 6.8 g/dL (ref 6.5–8.1)

## 2022-07-02 LAB — CBC
HCT: 33.7 % — ABNORMAL LOW (ref 36.0–46.0)
Hemoglobin: 11.4 g/dL — ABNORMAL LOW (ref 12.0–15.0)
MCH: 27.9 pg (ref 26.0–34.0)
MCHC: 33.8 g/dL (ref 30.0–36.0)
MCV: 82.6 fL (ref 80.0–100.0)
Platelets: 265 10*3/uL (ref 150–400)
RBC: 4.08 MIL/uL (ref 3.87–5.11)
RDW: 14.9 % (ref 11.5–15.5)
WBC: 13.5 10*3/uL — ABNORMAL HIGH (ref 4.0–10.5)
nRBC: 0 % (ref 0.0–0.2)

## 2022-07-02 LAB — CULTURE, BLOOD (ROUTINE X 2)

## 2022-07-02 LAB — MAGNESIUM: Magnesium: 1.9 mg/dL (ref 1.7–2.4)

## 2022-07-02 MED ORDER — TECHNETIUM TC 99M MEBROFENIN IV KIT
6.8000 | PACK | Freq: Once | INTRAVENOUS | Status: AC
  Administered 2022-07-02: 6.8 via INTRAVENOUS

## 2022-07-02 MED ORDER — IPRATROPIUM-ALBUTEROL 0.5-2.5 (3) MG/3ML IN SOLN
3.0000 mL | Freq: Two times a day (BID) | RESPIRATORY_TRACT | Status: DC
  Administered 2022-07-02 – 2022-07-06 (×7): 3 mL via RESPIRATORY_TRACT
  Filled 2022-07-02 (×7): qty 3

## 2022-07-02 MED ORDER — MORPHINE SULFATE (PF) 2 MG/ML IV SOLN
INTRAVENOUS | Status: AC
  Filled 2022-07-02: qty 2

## 2022-07-02 MED ORDER — LACTATED RINGERS IV SOLN
INTRAVENOUS | Status: AC
  Administered 2022-07-02: 50 mL/h via INTRAVENOUS

## 2022-07-02 MED ORDER — SODIUM CHLORIDE 0.9 % IV SOLN
2.0000 g | Freq: Three times a day (TID) | INTRAVENOUS | Status: DC
  Administered 2022-07-02 – 2022-07-06 (×12): 2 g via INTRAVENOUS
  Filled 2022-07-02 (×12): qty 12.5

## 2022-07-02 MED ORDER — POTASSIUM CHLORIDE 20 MEQ PO PACK
60.0000 meq | PACK | Freq: Once | ORAL | Status: AC
  Administered 2022-07-02: 60 meq via ORAL
  Filled 2022-07-02: qty 3

## 2022-07-02 MED ORDER — MORPHINE SULFATE (PF) 4 MG/ML IV SOLN: 3.0000 mg | Freq: Once | INTRAVENOUS | Status: DC

## 2022-07-02 MED ORDER — LISINOPRIL 20 MG PO TABS
20.0000 mg | ORAL_TABLET | Freq: Every day | ORAL | Status: DC
  Administered 2022-07-02 – 2022-07-06 (×5): 20 mg via ORAL
  Filled 2022-07-02 (×4): qty 1
  Filled 2022-07-02: qty 2

## 2022-07-02 NOTE — Progress Notes (Signed)
PROGRESS NOTE    Catherine Erickson  [id number] DOB: 1955/07/12 DOA: 06/29/2022 PCP: Pcp, No   Brief Narrative:  67 year old with history of trach dependence, chronic pain, HTN, HLD admitted to the hospital for SIRS.  Patient able to communicate effectively but had difficulty speaking.  She started experiencing epigastric abdominal pain day prior to admission.  Upon admission patient was started on broad-spectrum antibiotics.  Patient was found to have Klebsiella bacteremia.  Transaminitis is improving but the right upper quadrant abdominal pain ultrasound ordered which showed choledocholithiasis without evidence of cholecystitis.  HIDA scan ordered.   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  CT abdomen pelvis shows questionable pyelonephritis but UA is negative.  There is evidence of possible pneumonia.  Elevated BNP and procalcitonin. - MRSA swab negative.  Blood cultures growing Klebsiella.  Continue broad-spectrum antibiotics, de-escalate once sensitivity data is available. - IV fluids. - Bronchodilators scheduled and as needed  Transaminitis - LFTs elevated yesterday including total bilirubin.  Right upper quadrant ultrasound shows cholelithiasis but no evidence of acute cholecystitis.  LFTs improving but persistent abdominal pain therefore ordered HIDA scan. HIDA abnormal, will discuss with Gen Surgery. Dr Benson Norway from GI consulted as well - Acute hepatitis panel, lipase is negative  Hypokalemia - Aggressive repletion  CKD stage IIIb - Creatinine around baseline of 1.4.  Essential hypertension - Norvasc.  Resume  lisinopril 20 mg daily, holding HCTZ. IV as needed  Tracheostomy dependence ([place]) -Routine care  Hyperlipidemia - Hold statin while LFTs are elevated  DVT prophylaxis: SQ Heparin Code Status: Full Family Communication:    Status is: Inpatient Continue hospital stay due to multiple ongoing issues.  Subjective: Seen at bedside. Still has RUQ pain.   Examination: Constitutional: Not in acute distress Respiratory: Clear to auscultation bilaterally Cardiovascular: Normal sinus rhythm, no rubs Abdomen: Nontender nondistended good bowel sounds Musculoskeletal: No edema noted Skin: No rashes seen Neurologic: CN 2-12 grossly intact.  And nonfocal Psychiatric: Normal judgment and insight. Alert and oriented x 3. Normal mood.     Chronic trach in place  Objective: Vitals:   07/02/22 0600 07/02/22 0611 07/02/22 0630 07/02/22 0700  BP: (!) 181/77 (!) 184/70 (!) 162/84   Pulse: 80  88   Resp: (!) 22  (!) 23   Temp:    98.2 F (36.8 C)  TempSrc:    Oral  SpO2: 94%  95%   Weight:      Height:        Intake/Output Summary (Last 24 hours) at 07/02/2022 0742 Last data filed at 07/02/2022 0400 Gross per 24 hour  Intake 2282.34 ml  Output 2800 ml  Net -517.66 ml   Filed Weights   06/30/22 0044 06/30/22 1238 07/02/22 0500  Weight: 108.9 kg 109.2 kg 107.7 kg     Data Reviewed:   CBC: Recent Labs  Lab 06/29/22 2242 06/30/22 0427 07/01/22 0323 07/02/22 0331  WBC 9.9 31.5* 18.5* 13.5*  NEUTROABS 9.3* 28.7*  --   --   HGB 14.0 11.4* 12.1 11.4*  HCT 40.4 34.3* 36.5 33.7*  MCV 80.6 82.3 83.5 82.6  PLT 323 257 262 265  Basic Metabolic Panel: Recent Labs  Lab 06/29/22 2242 06/30/22 0042 06/30/22 0427 07/01/22 0323 07/02/22 0331  NA 133*  --  136 140 136  K 2.7*  --  2.9* 3.0* 3.3*  CL 99  --  100 103 103  CO2 23  --  24 27 25   GLUCOSE 169*  --  157* 130* 128*  BUN 12  --  15 14 10   CREATININE 1.39*  --  1.53* 1.32* 1.10*  CALCIUM 9.0  --  8.2* 8.7* 8.5*  MG   --  1.8 1.6*  --  1.9   GFR: Estimated Creatinine Clearance: 61.9 mL/min (A) (by C-G formula based on SCr of 1.1 mg/dL (H)). Liver Function Tests: Recent Labs  Lab 06/29/22 2242 06/30/22 0427 07/01/22 0830 07/02/22 0331  AST 548* 320* 118* 83*  ALT 307* 238* 155* 121*  ALKPHOS 225* 148* 155* 170*  BILITOT 4.2* 4.2* 3.7* 3.1*  PROT 7.9 6.5 6.9 6.8  ALBUMIN 3.6 2.8* 2.8* 2.8*   Recent Labs  Lab 06/29/22 0042 07/01/22 0830  LIPASE 27 23   No results for input(s): "AMMONIA" in the last 168 hours. Coagulation Profile: Recent Labs  Lab 06/29/22 2242  INR 1.1   Cardiac Enzymes: No results for input(s): "CKTOTAL", "CKMB", "CKMBINDEX", "TROPONINI" in the last 168 hours. BNP (last 3 results) No results for input(s): "PROBNP" in the last 8760 hours. HbA1C: No results for input(s): "HGBA1C" in the last 72 hours. CBG: No results for input(s): "GLUCAP" in the last 168 hours. Lipid Profile: No results for input(s): "CHOL", "HDL", "LDLCALC", "TRIG", "CHOLHDL", "LDLDIRECT" in the last 72 hours. Thyroid Function Tests: No results for input(s): "TSH", "T4TOTAL", "FREET4", "T3FREE", "THYROIDAB" in the last 72 hours. Anemia Panel: No results for input(s): "VITAMINB12", "FOLATE", "FERRITIN", "TIBC", "IRON", "RETICCTPCT" in the last 72 hours. Sepsis Labs: Recent Labs  Lab 06/30/22 0100 06/30/22 0851 06/30/22 1240 06/30/22 1541 07/01/22 0830  PROCALCITON  --   --   --   --  23.68  LATICACIDVEN 4.0* 3.6* 1.9 1.4  --     Recent Results (from the past 240 hour(s))  Culture, blood (Routine x 2)     Status: None (Preliminary result)   Collection Time: 06/29/22 10:55 PM   Specimen: BLOOD LEFT WRIST  Result Value Ref Range Status   Specimen Description   Final    BLOOD LEFT WRIST Performed at [facility] [address] [place], [place] [address]    Special Requests   Final    BOTTLES DRAWN AEROBIC AND ANAEROBIC Blood Culture adequate volume Performed at  [facility] [address] [place], [place] [address]    Culture  Setup Time   Final    GRAM NEGATIVE RODS AEROBIC BOTTLE ONLY CRITICAL RESULT CALLED TO, READ BACK BY AND VERIFIED WITH: PHARMD MICHELLE LILLISTON ON 06/30/22 @ 2321 BY DRT    Culture   Final    GRAM NEGATIVE RODS IDENTIFICATION AND SUSCEPTIBILITIES TO FOLLOW Performed at [facility], [place] [address], [place], [place] [address]    Report Status PENDING  Incomplete  Culture, blood (Routine x 2)     Status: Abnormal (Preliminary result)   Collection Time: 06/29/22 10:55 PM   Specimen: BLOOD  Result Value Ref Range Status   Specimen Description   Final    BLOOD LEFT ANTECUBITAL Performed at [facility], [place] [address] [place], [place] [address]    Special Requests   Final  BOTTLES DRAWN AEROBIC AND ANAEROBIC Blood Culture results may not be optimal due to an inadequate volume of blood received in culture bottles Performed at [facility], [place] [address], [place], [place] [address]    Culture  Setup Time   Final    GRAM NEGATIVE RODS IN BOTH AEROBIC AND ANAEROBIC BOTTLES CRITICAL RESULT CALLED TO, READ BACK BY AND VERIFIED WITH: [place] CU:[id number] BY JRS    Culture (A)  Final    KLEBSIELLA PNEUMONIAE SUSCEPTIBILITIES TO FOLLOW Performed at [facility], [place] [address] [place], [place] [address]    Report Status PENDING  Incomplete  Blood Culture ID Panel (Reflexed)     Status: Abnormal   Collection Time: 06/29/22 10:55 PM  Result Value Ref Range Status   Enterococcus faecalis NOT DETECTED NOT DETECTED Final   Enterococcus Faecium NOT DETECTED NOT DETECTED Final   Listeria monocytogenes NOT DETECTED NOT DETECTED Final   Staphylococcus species NOT DETECTED NOT DETECTED Final   Staphylococcus aureus (BCID) NOT DETECTED NOT DETECTED Final   Staphylococcus epidermidis NOT DETECTED NOT DETECTED Final   Staphylococcus lugdunensis NOT  DETECTED NOT DETECTED Final   Streptococcus species NOT DETECTED NOT DETECTED Final   Streptococcus agalactiae NOT DETECTED NOT DETECTED Final   Streptococcus pneumoniae NOT DETECTED NOT DETECTED Final   Streptococcus pyogenes NOT DETECTED NOT DETECTED Final   A.calcoaceticus-baumannii NOT DETECTED NOT DETECTED Final   Bacteroides fragilis NOT DETECTED NOT DETECTED Final   Enterobacterales DETECTED (A) NOT DETECTED Final    Comment: Enterobacterales represent a large order of gram negative bacteria, not a single organism. CRITICAL RESULT CALLED TO, READ BACK BY AND VERIFIED WITH: PHARMD Ben[place] CU:[id number] BY JRS    Enterobacter cloacae complex NOT DETECTED NOT DETECTED Final   Escherichia coli NOT DETECTED NOT DETECTED Final   Klebsiella aerogenes NOT DETECTED NOT DETECTED Final   Klebsiella oxytoca NOT DETECTED NOT DETECTED Final   Klebsiella pneumoniae DETECTED (A) NOT DETECTED Final    Comment: CRITICAL RESULT CALLED TO, READ BACK BY AND VERIFIED WITH: PHARMD CHRISTINE SHADE CU:[id number] BY JRS.    Proteus species NOT DETECTED NOT DETECTED Final   Salmonella species NOT DETECTED NOT DETECTED Final   Serratia marcescens NOT DETECTED NOT DETECTED Final   Haemophilus influenzae NOT DETECTED NOT DETECTED Final   Neisseria meningitidis NOT DETECTED NOT DETECTED Final   Pseudomonas aeruginosa NOT DETECTED NOT DETECTED Final   Stenotrophomonas maltophilia NOT DETECTED NOT DETECTED Final   Candida albicans NOT DETECTED NOT DETECTED Final   Candida auris NOT DETECTED NOT DETECTED Final   Candida glabrata NOT DETECTED NOT DETECTED Final   Candida krusei NOT DETECTED NOT DETECTED Final   Candida parapsilosis NOT DETECTED NOT DETECTED Final   Candida tropicalis NOT DETECTED NOT DETECTED Final   Cryptococcus neoformans/gattii NOT DETECTED NOT DETECTED Final   CTX-M ESBL NOT DETECTED NOT DETECTED Final   Carbapenem resistance IMP NOT DETECTED NOT DETECTED Final   Carbapenem resistance  KPC NOT DETECTED NOT DETECTED Final   Carbapenem resistance NDM NOT DETECTED NOT DETECTED Final   Carbapenem resist OXA 48 LIKE NOT DETECTED NOT DETECTED Final   Carbapenem resistance VIM NOT DETECTED NOT DETECTED Final    Comment: Performed at [facility], [address] [place], [place] [address]  MRSA Next Gen by PCR, Nasal     Status: None   Collection Time: 06/30/22 12:26 PM   Specimen: Nasal Mucosa; Nasal Swab  Result Value Ref  Range Status   MRSA by PCR Next Gen NOT DETECTED NOT DETECTED Final    Comment: (NOTE) The GeneXpert MRSA Assay (FDA approved for NASAL specimens only), is one component of a comprehensive MRSA colonization surveillance program. It is not intended to diagnose MRSA infection nor to guide or monitor treatment for MRSA infections. Test performance is not FDA approved in patients less than 84 years old. Performed at [facility], [place] [address] [place], [place] [address]          Radiology Studies: No results found.      Scheduled Meds:  amLODipine  10 mg Oral Daily   Chlorhexidine Gluconate Cloth  6 each Topical Daily   heparin injection (subcutaneous)  5,000 Units Subcutaneous Q8H   ipratropium-albuterol  3 mL Nebulization Q6H   mouth rinse  15 mL Mouth Rinse 4 times per day   potassium chloride  60 mEq Oral Once   Continuous Infusions:  cefTRIAXone (ROCEPHIN)  IV Stopped (07/01/22 2312)   lactated ringers     metronidazole Stopped (07/01/22 2341)     LOS: 2 days   Time spent= 35 mins    Mackinley Cassaday Arsenio Loader, MD [facility]  If 7PM-7AM, please contact night-coverage  07/02/2022, 7:42 AM

## 2022-07-02 NOTE — Consult Note (Signed)
Reason for Consult: Abnormal liver enzymes Referring Physician: [place] HPI: This is a 67 year old female with a PMH of esophageal stricture s/p dilation in 2018, hyperlipidemia and a chronic trach admitted for sepsis.  The history states that she woke up acutely from a nap with severe epigastric pain.  A large bowel movement followed, but her pain continued to persist.  The patient did not notice any issues with hematochezia or melena with her bowel movements.  Before this event the patient denied having any problems with abdominal pain, nausea, or vomiting.  Further work up in the ER showed that she was febrile at 103 and a CT scan showed cholelithiasis without an acute cholecystitis.  Her liver enzymes were markedly elevated, but over the interval time period her liver enzymes continued to improve.  Also her TB declined.  A HIDA scan was positive for a cholecystitis, but there was also the suspicion of an hepatitis.  Her pain did not remit, but it did improved and now she states that the pain is intermittent.    Past Medical History:  Diagnosis Date   Cancer [facility])     Past Surgical History:  Procedure Laterality Date   ESOPHAGOSCOPY Right 10/02/2016   Procedure: ESOPHAGOSCOPY;  Surgeon: Carol Ada, MD;  Location: [facility];  Service: Endoscopy;  Laterality: Right;   IR FLUORO GUIDE CV LINE RIGHT  10/22/2016   TRACHEOSTOMY N/A    VIDEO ASSISTED THORACOSCOPY (VATS)/EMPYEMA Right 10/15/2016   Procedure: THOROCOTOMY DRAINAGE OF MEDIASTINAL ABSCESS AND EMPYEMA;  Surgeon: Gaye Pollack, MD;  Location: [facility];  Service: Thoracic;  Laterality: Right;   VIDEO BRONCHOSCOPY N/A 10/02/2016   Procedure: VIDEO BRONCHOSCOPY;  Surgeon: Melrose Nakayama, MD;  Location: [facility];  Service: Thoracic;  Laterality: N/A;    History reviewed. No pertinent family history.  Social History:  reports that she has quit smoking. She has never used smokeless tobacco. She reports that she does  not drink alcohol and does not use drugs.  Allergies: No Known Allergies  Medications: Scheduled:  amLODipine  10 mg Oral Daily   Chlorhexidine Gluconate Cloth  6 each Topical Daily   heparin injection (subcutaneous)  5,000 Units Subcutaneous Q8H   ipratropium-albuterol  3 mL Nebulization BID   lisinopril  20 mg Oral Daily   morphine (PF)        morphine injection  3 mg Intravenous Once   mouth rinse  15 mL Mouth Rinse 4 times per day   Continuous:  ceFEPime (MAXIPIME) IV     lactated ringers 50 mL/hr (07/02/22 1214)   metronidazole Stopped (07/02/22 1315)    Results for orders placed or performed during the hospital encounter of 06/29/22 (from the past 24 hour(s))  CBC     Status: Abnormal   Collection Time: 07/02/22  3:31 AM  Result Value Ref Range   WBC 13.5 (H) 4.0 - 10.5 K/uL   RBC 4.08 3.87 - 5.11 MIL/uL   Hemoglobin 11.4 (L) 12.0 - 15.0 g/dL   HCT 33.7 (L) 36.0 - 46.0 %   MCV 82.6 80.0 - 100.0 fL   MCH 27.9 26.0 - 34.0 pg   MCHC 33.8 30.0 - 36.0 g/dL   RDW 14.9 11.5 - 15.5 %   Platelets 265 150 - 400 K/uL   nRBC 0.0 0.0 - 0.2 %  Comprehensive metabolic panel     Status: Abnormal   Collection Time: 07/02/22  3:31 AM  Result Value Ref Range  Sodium 136 135 - 145 mmol/L   Potassium 3.3 (L) 3.5 - 5.1 mmol/L   Chloride 103 98 - 111 mmol/L   CO2 25 22 - 32 mmol/L   Glucose, Bld 128 (H) 70 - 99 mg/dL   BUN 10 8 - 23 mg/dL   Creatinine, Ser 1.10 (H) 0.44 - 1.00 mg/dL   Calcium 8.5 (L) 8.9 - 10.3 mg/dL   Total Protein 6.8 6.5 - 8.1 g/dL   Albumin 2.8 (L) 3.5 - 5.0 g/dL   AST 83 (H) 15 - 41 U/L   ALT 121 (H) 0 - 44 U/L   Alkaline Phosphatase 170 (H) 38 - 126 U/L   Total Bilirubin 3.1 (H) 0.3 - 1.2 mg/dL   GFR, Estimated 56 (L) >60 mL/min   Anion gap 8 5 - 15  Magnesium     Status: None   Collection Time: 07/02/22  3:31 AM  Result Value Ref Range   Magnesium 1.9 1.7 - 2.4 mg/dL     NM Hepatobiliary Liver Func  Result Date: 07/02/2022 CLINICAL DATA:   Cholelithiasis concern for acute cholecystitis. Elevated LFTs. EXAM: NUCLEAR MEDICINE HEPATOBILIARY IMAGING TECHNIQUE: Sequential images of the abdomen were obtained out to 60 minutes following intravenous administration of radiopharmaceutical. An additional 30 minutes of scintigraphic imaging was obtained post 3 mg administration of IV morphine. RADIOPHARMACEUTICALS:  6.8 mCi Tc-36m  Choletec IV COMPARISON:  CT abdomen pelvis June 30, 2022 and ultrasound June 29, 2022 FINDINGS: Slightly prolonged radiotracer clearance from blood pool. Relative photopenia of the hepatic dome with delayed hepatocyte radiotracer clearance. Gallbladder activity is not visualized pre or post morphine administration. Biliary activity passes into small bowel, consistent with patent common bile duct. IMPRESSION: 1. Delayed hepatic radiotracer clearance with slightly prolonged clearance from blood pool suggestive of hepatitis. 2. Gallbladder activity is not visualized pre or post morphine administration as can be seen with acute cholecystitis. However, specificity for acute cholecystitis in the limited setting probable hepatitis. 3. Relative photopenia of the hepatic dome is favored to reflect artifact. Electronically Signed   By: Dahlia Bailiff M.D.   On: 07/02/2022 12:24    ROS:  As stated above in the HPI otherwise negative.  Blood pressure (!) 159/71, pulse 96, temperature 98.2 F (36.8 C), temperature source Oral, resp. rate 16, height 5\' 4"  (1.626 m), weight 107.7 kg, SpO2 94 %.    PE: Gen: NAD, Alert and Oriented HEENT:  [place]/AT, EOMI Neck: Supple, no LAD Lungs: CTA Bilaterally CV: RRR without M/G/R ABD: Soft, RUQ and epigastric tenderness, +BS Ext: No C/C/E  Assessment/Plan: 1) Cholelithiasis. 2) Cholecystitis per the HIDA scan. 3) Abnormal liver enzymes. 4) Epigastric pain.   It may be that she passed a small stone.  Her liver enzymes is consistent with this presentation, even though her clinical presentation  is not completely consistent, ie, she continues to have abdominal pain.  However, the HIDA is significant for acute cholecystitis.  She is negative for HBV and HCV.  The elevated liver enzymes are felt to be from a biliary source, specifically, the cholecystitis.  Her liver enzymes are improving and her hepatic synthetic function is intact.  Plan: 1) Recommend with lap chole. 2) Continue to follow liver enzymes. 3) If you have any further questions, Dr. Alessandra Bevels is covering this weekend.  Lasha Echeverria D 07/02/2022, 4:38 PM

## 2022-07-02 NOTE — Progress Notes (Signed)
Pt off floor to MRI. O2 swapped to portable tank on 4 L/M per RRT. Portable VS and EKG monitoring with pt. LR infusing at 75 ml/hr. Assessment completed this AM. Pt reported discomfort in the RUQ and LUQ. Educated pt on not being able to have pain medication prior to the [place].

## 2022-07-02 NOTE — Consult Note (Signed)
Consult Note  Catherine Erickson 08/19/1955  [id number].    Requesting MD: Damita Lack, MD Chief Complaint/Reason for Consult: RUQ pain, possible cholecystitis   HPI:  Patient is a 67 year old female who presented to the ED with abdominal pain 3/12. She woke up from a nap around 4PM that day and noted cramping RUQ abdominal pain that was non-radiating. Prior to that she had eaten a salad. She had associated non-bloody watery diarrhea x1. Her daughter tried giving her pepto bismol, tylenol, ibuprofen and imodium and none seemed to help. She also noted some body aches and chills, subjective fever. No other sick contacts and she denied nausea, vomiting or urinary symptoms. She denies back or flank pain. She does think she has had intermittent abdominal pain after eating for several years but never to this severity.  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 No prior abdominal surgery and not on blood thinners at baseline. NKDA.   She has had up to full liquids during hospitalization. She continues to have intermittent abdominal pain in epigastrium and RUQ but it is not worsened by food. Worsened by palpation. She denies n/v. No further bowel movements since admission  ROS: Negative other than above  History reviewed. No pertinent family history.  Past Medical History:  Diagnosis Date   Cancer [facility])     Past Surgical History:  Procedure Laterality Date   ESOPHAGOSCOPY Right 10/02/2016   Procedure: ESOPHAGOSCOPY;  Surgeon: Carol Ada, MD;  Location: [facility];  Service: Endoscopy;  Laterality: Right;   IR FLUORO GUIDE CV LINE RIGHT  10/22/2016   TRACHEOSTOMY N/A    VIDEO ASSISTED THORACOSCOPY (VATS)/EMPYEMA Right 10/15/2016   Procedure: THOROCOTOMY DRAINAGE OF MEDIASTINAL ABSCESS AND EMPYEMA;  Surgeon: Gaye Pollack, MD;  Location: [facility];  Service: Thoracic;  Laterality: Right;   VIDEO BRONCHOSCOPY N/A 10/02/2016   Procedure:  VIDEO BRONCHOSCOPY;  Surgeon: Melrose Nakayama, MD;  Location: [facility];  Service: Thoracic;  Laterality: N/A;    Social History:  reports that she has quit smoking. She has never used smokeless tobacco. She reports that she does not drink alcohol and does not use drugs.  Allergies: No Known Allergies  Medications Prior to Admission  Medication Sig Dispense Refill   metFORMIN (GLUCOPHAGE) 500 MG tablet Take 500 mg by mouth 2 (two) times daily with a meal.     rosuvastatin (CRESTOR) 10 MG tablet Take 10 mg by mouth at bedtime.     amLODipine (NORVASC) 10 MG tablet Take 10 mg by mouth daily.     cetirizine (ZYRTEC) 10 MG tablet Take 10 mg by mouth daily.     cyclobenzaprine (FLEXERIL) 10 MG tablet Take 1 tablet (10 mg total) by mouth 2 (two) times daily as needed for muscle spasms. 20 tablet 0   hydrochlorothiazide (HYDRODIURIL) 25 MG tablet Take 25 mg by mouth daily.     HYDROcodone-acetaminophen (NORCO/VICODIN) 5-325 MG tablet Take 1 tablet by mouth every 4 (four) hours as needed. 10 tablet 0   lisinopril-hydrochlorothiazide (PRINZIDE,ZESTORETIC) 20-25 MG tablet Take 1 tablet by mouth daily after breakfast.  1   metroNIDAZOLE (FLAGYL) 500 MG tablet Take 1 tablet (500 mg total) by mouth 2 (two) times daily. 14 tablet 0   naproxen (NAPROSYN) 500 MG tablet Take 1 tablet (500 mg total) by mouth 2 (two) times daily. 30 tablet 0   ondansetron (ZOFRAN ODT) 4 MG disintegrating tablet Take 1  tablet (4 mg total) by mouth every 8 (eight) hours as needed. 10 tablet 0   Probiotic Product (SOLUBLE FIBER/PROBIOTICS) CHEW Chew 1 capsule by mouth 2 (two) times daily. 30 tablet 0   rosuvastatin (CRESTOR) 20 MG tablet Take 20 mg by mouth daily. (Patient not taking: Reported on 06/30/2022)      Blood pressure (!) 160/58, pulse 88, temperature 98.2 F (36.8 C), temperature source Oral, resp. rate (!) 23, height 5\' 4"  (1.626 m), weight 107.7 kg, SpO2 96 %. Physical Exam:  General: pleasant, WD, female who is  sitting up in bed in NAD HEENT: head is normocephalic, atraumatic.  Sclera are noninjected.  PERRL.  Ears and nose without any masses or lesions.  Mouth is pink and moist Heart: regular, rate, and rhythm.  Normal s1,s2. No obvious murmurs, gallops, or rubs noted.  Palpable radial and pedal pulses bilaterally Lungs: tracheostomy in place, HTC Abd: soft, ND, no masses, hernias, or organomegaly. TTP in RUQ>epigastrium without rebound or guarding  MS: all 4 extremities are symmetrical with no cyanosis, clubbing, or edema. Skin: warm and dry with no masses, lesions, or rashes Neuro: Cranial nerves 2-12 grossly intact, sensation is normal throughout Psych: A&Ox3 with an appropriate affect.   Results for orders placed or performed during the hospital encounter of 06/29/22 (from the past 48 hour(s))  Lactic acid, plasma     Status: None   Collection Time: 06/30/22  3:41 PM  Result Value Ref Range   Lactic Acid, Venous 1.4 0.5 - 1.9 mmol/L    Comment: Performed at [facility], [place] [address], [place], [place] [address]  CBC     Status: Abnormal   Collection Time: 07/01/22  3:23 AM  Result Value Ref Range   WBC 18.5 (H) 4.0 - 10.5 K/uL   RBC 4.37 3.87 - 5.11 MIL/uL   Hemoglobin 12.1 12.0 - 15.0 g/dL   HCT 36.5 36.0 - 46.0 %   MCV 83.5 80.0 - 100.0 fL   MCH 27.7 26.0 - 34.0 pg   MCHC 33.2 30.0 - 36.0 g/dL   RDW 15.0 11.5 - 15.5 %   Platelets 262 150 - 400 K/uL   nRBC 0.0 0.0 - 0.2 %    Comment: Performed at [facility], [place] [address] [place], [place] [id number]  Basic metabolic panel     Status: Abnormal   Collection Time: 07/01/22  3:23 AM  Result Value Ref Range   Sodium 140 135 - 145 mmol/L   Potassium 3.0 (L) 3.5 - 5.1 mmol/L   Chloride 103 98 - 111 mmol/L   CO2 27 22 - 32 mmol/L   Glucose, Bld 130 (H) 70 - 99 mg/dL    Comment: Glucose reference range applies only to samples taken after fasting for at least 8 hours.   BUN 14 8 - 23 mg/dL    Creatinine, Ser 1.32 (H) 0.44 - 1.00 mg/dL   Calcium 8.7 (L) 8.9 - 10.3 mg/dL   GFR, Estimated 45 (L) >60 mL/min    Comment: (NOTE) Calculated using the CKD-EPI Creatinine Equation (2021)    Anion gap 10 5 - 15    Comment: Performed at [facility], [place] [address] [place], [place] [address]  Procalcitonin     Status: None   Collection Time: 07/01/22  8:30 AM  Result Value Ref Range   Procalcitonin 23.68 ng/mL    Comment:        Interpretation: PCT >= 10 ng/mL: Important  systemic inflammatory response, almost exclusively due to severe bacterial sepsis or septic shock. (NOTE)       Sepsis PCT Algorithm           Lower Respiratory Tract                                      Infection PCT Algorithm    ----------------------------     ----------------------------         PCT < 0.25 ng/mL                PCT < 0.10 ng/mL          Strongly encourage             Strongly discourage   discontinuation of antibiotics    initiation of antibiotics    ----------------------------     -----------------------------       PCT 0.25 - 0.50 ng/mL            PCT 0.10 - 0.25 ng/mL               OR       >80% decrease in PCT            Discourage initiation of                                            antibiotics      Encourage discontinuation           of antibiotics    ----------------------------     -----------------------------         PCT >= 0.50 ng/mL              PCT 0.26 - 0.50 ng/mL                AND       <80% decrease in PCT             Encourage initiation of                                             antibiotics       Encourage continuation           of antibiotics    ----------------------------     -----------------------------        PCT >= 0.50 ng/mL                  PCT > 0.50 ng/mL               AND         increase in PCT                  Strongly encourage                                      initiation of antibiotics    Strongly encourage escalation            of antibiotics                                     -----------------------------  PCT <= 0.25 ng/mL                                                 OR                                        > 80% decrease in PCT                                      Discontinue / Do not initiate                                             antibiotics  Performed at [place] [address] [place], [place] [address]   Brain natriuretic peptide     Status: Abnormal   Collection Time: 07/01/22  8:30 AM  Result Value Ref Range   B Natriuretic Peptide 300.7 (H) 0.0 - 100.0 pg/mL    Comment: Performed at [facility], [place] [address], [place], [place] [address]  Hepatitis panel, acute     Status: None   Collection Time: 07/01/22  8:30 AM  Result Value Ref Range   Hepatitis B Surface Ag NON REACTIVE NON REACTIVE   HCV Ab NON REACTIVE NON REACTIVE    Comment: (NOTE) Nonreactive HCV antibody screen is consistent with no HCV infections,  unless recent infection is suspected or other evidence exists to indicate HCV infection.     Hep A IgM NON REACTIVE NON REACTIVE   Hep B C IgM NON REACTIVE NON REACTIVE    Comment: Performed at [facility], [place] [address], [place], [place] [address]  Lipase, blood     Status: None   Collection Time: 07/01/22  8:30 AM  Result Value Ref Range   Lipase 23 11 - 51 U/L    Comment: Performed at [facility], [place] [address] [place], [place] [address]  Hepatic function panel     Status: Abnormal   Collection Time: 07/01/22  8:30 AM  Result Value Ref Range   Total Protein 6.9 6.5 - 8.1 g/dL   Albumin 2.8 (L) 3.5 - 5.0 g/dL   AST 118 (H) 15 - 41 U/L   ALT 155 (H) 0 - 44 U/L   Alkaline Phosphatase 155 (H) 38 - 126 U/L   Total Bilirubin 3.7 (H) 0.3 - 1.2 mg/dL   Bilirubin, Direct 2.3 (H) 0.0 - 0.2 mg/dL   Indirect Bilirubin 1.4 (H) 0.3 - 0.9 mg/dL    Comment:  Performed at [facility], [place] [address] [place],  [address]  CBC     Status: Abnormal   Collection Time: 07/02/22  3:31 AM  Result Value Ref Range   WBC 13.5 (H) 4.0 - 10.5 K/uL   RBC 4.08 3.87 - 5.11 MIL/uL   Hemoglobin 11.4 (L) 12.0 - 15.0 g/dL   HCT 33.7 (L) 36.0 - 46.0 %   MCV 82.6 80.0 - 100.0 fL   MCH 27.9 26.0 - 34.0 pg   MCHC 33.8 30.0 -  36.0 g/dL   RDW 14.9 11.5 - 15.5 %   Platelets 265 150 - 400 K/uL   nRBC 0.0 0.0 - 0.2 %    Comment: Performed at [facility], [place] [address] [place], [place] [address]  Comprehensive metabolic panel     Status: Abnormal   Collection Time: 07/02/22  3:31 AM  Result Value Ref Range   Sodium 136 135 - 145 mmol/L   Potassium 3.3 (L) 3.5 - 5.1 mmol/L   Chloride 103 98 - 111 mmol/L   CO2 25 22 - 32 mmol/L   Glucose, Bld 128 (H) 70 - 99 mg/dL    Comment: Glucose reference range applies only to samples taken after fasting for at least 8 hours.   BUN 10 8 - 23 mg/dL   Creatinine, Ser 1.10 (H) 0.44 - 1.00 mg/dL   Calcium 8.5 (L) 8.9 - 10.3 mg/dL   Total Protein 6.8 6.5 - 8.1 g/dL   Albumin 2.8 (L) 3.5 - 5.0 g/dL   AST 83 (H) 15 - 41 U/L   ALT 121 (H) 0 - 44 U/L   Alkaline Phosphatase 170 (H) 38 - 126 U/L   Total Bilirubin 3.1 (H) 0.3 - 1.2 mg/dL   GFR, Estimated 56 (L) >60 mL/min    Comment: (NOTE) Calculated using the CKD-EPI Creatinine Equation (2021)    Anion gap 8 5 - 15    Comment: Performed at [facility], [place] [address], [place], [place] [address]  Magnesium     Status: None   Collection Time: 07/02/22  3:31 AM  Result Value Ref Range   Magnesium 1.9 1.7 - 2.4 mg/dL    Comment: Performed at [facility], [place] [address], [place], [place] [address]   NM Hepatobiliary Liver Func  Result Date: 07/02/2022 CLINICAL DATA:  Cholelithiasis concern for acute cholecystitis. Elevated LFTs. EXAM: NUCLEAR MEDICINE HEPATOBILIARY IMAGING TECHNIQUE: Sequential  images of the abdomen were obtained out to 60 minutes following intravenous administration of radiopharmaceutical. An additional 30 minutes of scintigraphic imaging was obtained post 3 mg administration of IV morphine. RADIOPHARMACEUTICALS:  6.8 mCi Tc-31m  Choletec IV COMPARISON:  CT abdomen pelvis June 30, 2022 and ultrasound June 29, 2022 FINDINGS: Slightly prolonged radiotracer clearance from blood pool. Relative photopenia of the hepatic dome with delayed hepatocyte radiotracer clearance. Gallbladder activity is not visualized pre or post morphine administration. Biliary activity passes into small bowel, consistent with patent common bile duct. IMPRESSION: 1. Delayed hepatic radiotracer clearance with slightly prolonged clearance from blood pool suggestive of hepatitis. 2. Gallbladder activity is not visualized pre or post morphine administration as can be seen with acute cholecystitis. However, specificity for acute cholecystitis in the limited setting probable hepatitis. 3. Relative photopenia of the hepatic dome is favored to reflect artifact. Electronically Signed   By: Dahlia Bailiff M.D.   On: 07/02/2022 12:24      Assessment/Plan RUQ pain  Elevated LFTs with hyperbilirubinemia Probable Cholecystitis  - RUQ [place] with cholelithiasis and no evidence of cholecystitis  - CT with cholelithiasis  - HIDA with possible hepatitis and no gallbladder activity suggestive of possible cholecystitis  - hepatitis panel negative and LFTs remain elevated - today Alk Phos 170, AST/ALT 83/121, Tbili 3.1 - exam and history consistent with biliary colic and probable acute cholecystitis  - given elevated LFTs and overall picture, think a GI consult would be helpful as well but we will follow for possible laparoscopic cholecystectomy this admission - will make  NPO MN for possible intervention as early as tomorrow but will await recheck labs/possible GI consult   FEN: FLD, IVF @50  cc/h VTE: SQH ID:  rocephin/flagyl 3/13>>  - below per TRH-  Septic shock with Klebsiella and Enterobacterales bacteremia HTN HLD CKD 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   Richard Miu, [facility] Surgery 07/02/2022, 2:03 PM Please see Amion for pager number during day hours 7:00am-4:30pm

## 2022-07-03 DIAGNOSIS — A419 Sepsis, unspecified organism: Secondary | ICD-10-CM | POA: Diagnosis not present

## 2022-07-03 DIAGNOSIS — R6521 Severe sepsis with septic shock: Secondary | ICD-10-CM | POA: Diagnosis not present

## 2022-07-03 LAB — COMPREHENSIVE METABOLIC PANEL
ALT: 103 U/L — ABNORMAL HIGH (ref 0–44)
AST: 71 U/L — ABNORMAL HIGH (ref 15–41)
Albumin: 2.9 g/dL — ABNORMAL LOW (ref 3.5–5.0)
Alkaline Phosphatase: 181 U/L — ABNORMAL HIGH (ref 38–126)
Anion gap: 9 (ref 5–15)
BUN: 10 mg/dL (ref 8–23)
CO2: 25 mmol/L (ref 22–32)
Calcium: 9.4 mg/dL (ref 8.9–10.3)
Chloride: 103 mmol/L (ref 98–111)
Creatinine, Ser: 1.07 mg/dL — ABNORMAL HIGH (ref 0.44–1.00)
GFR, Estimated: 58 mL/min — ABNORMAL LOW (ref >60)
Glucose, Bld: 120 mg/dL — ABNORMAL HIGH (ref 70–99)
Potassium: 3.5 mmol/L (ref 3.5–5.1)
Sodium: 137 mmol/L (ref 135–145)
Total Bilirubin: 2.5 mg/dL — ABNORMAL HIGH (ref 0.3–1.2)
Total Protein: 7 g/dL (ref 6.5–8.1)

## 2022-07-03 LAB — CBC
HCT: 35.7 % — ABNORMAL LOW (ref 36.0–46.0)
Hemoglobin: 12.1 g/dL (ref 12.0–15.0)
MCH: 27.4 pg (ref 26.0–34.0)
MCHC: 33.9 g/dL (ref 30.0–36.0)
MCV: 81 fL (ref 80.0–100.0)
Platelets: 298 10*3/uL (ref 150–400)
RBC: 4.41 MIL/uL (ref 3.87–5.11)
RDW: 14.6 % (ref 11.5–15.5)
WBC: 11.1 10*3/uL — ABNORMAL HIGH (ref 4.0–10.5)
nRBC: 0 % (ref 0.0–0.2)

## 2022-07-03 LAB — MAGNESIUM: Magnesium: 2 mg/dL (ref 1.7–2.4)

## 2022-07-03 MED ORDER — CHLORHEXIDINE GLUCONATE CLOTH 2 % EX PADS
6.0000 | MEDICATED_PAD | Freq: Every day | CUTANEOUS | Status: DC
  Administered 2022-07-03 – 2022-07-05 (×3): 6 via TOPICAL

## 2022-07-03 NOTE — Progress Notes (Signed)
Subjective/Chief Complaint: Patient denies abdominal pain   Objective: Vital signs in last 24 hours: Temp:  [98.2 F (36.8 C)-99.1 F (37.3 C)] 98.6 F (37 C) (03/16 0855) Pulse Rate:  [84-103] 85 (03/16 0855) Resp:  [16-25] 18 (03/16 0855) BP: (143-177)/(58-94) 143/94 (03/16 0855) SpO2:  [94 %-100 %] 100 % (03/16 0855) FiO2 (%):  [28 %] 28 % (03/16 0343) Weight:  [107.4 kg] 107.4 kg (03/16 0500) Last BM Date : 07/02/22  Intake/Output from previous day: 03/15 0701 - 03/16 0700 In: 2521.1 [P.O.:1120; I.V.:1001.1; IV Piggyback:400] Out: 1000 [Urine:1000] Intake/Output this shift: No intake/output data recorded.  GI: Soft nontender without rebound or guarding  Lab Results:  Recent Labs    07/02/22 0331 07/03/22 0445  WBC 13.5* 11.1*  HGB 11.4* 12.1  HCT 33.7* 35.7*  PLT 265 298   BMET Recent Labs    07/02/22 0331 07/03/22 0445  NA 136 137  K 3.3* 3.5  CL 103 103  CO2 25 25  GLUCOSE 128* 120*  BUN 10 10  CREATININE 1.10* 1.07*  CALCIUM 8.5* 9.4   PT/INR No results for input(s): "LABPROT", "INR" in the last 72 hours. ABG No results for input(s): "PHART", "HCO3" in the last 72 hours.  Invalid input(s): "PCO2", "PO2"  Studies/Results: NM Hepatobiliary Liver Func  Result Date: 07/02/2022 CLINICAL DATA:  Cholelithiasis concern for acute cholecystitis. Elevated LFTs. EXAM: NUCLEAR MEDICINE HEPATOBILIARY IMAGING TECHNIQUE: Sequential images of the abdomen were obtained out to 60 minutes following intravenous administration of radiopharmaceutical. An additional 30 minutes of scintigraphic imaging was obtained post 3 mg administration of IV morphine. RADIOPHARMACEUTICALS:  6.8 mCi Tc-53m  Choletec IV COMPARISON:  CT abdomen pelvis June 30, 2022 and ultrasound June 29, 2022 FINDINGS: Slightly prolonged radiotracer clearance from blood pool. Relative photopenia of the hepatic dome with delayed hepatocyte radiotracer clearance. Gallbladder activity is not  visualized pre or post morphine administration. Biliary activity passes into small bowel, consistent with patent common bile duct. IMPRESSION: 1. Delayed hepatic radiotracer clearance with slightly prolonged clearance from blood pool suggestive of hepatitis. 2. Gallbladder activity is not visualized pre or post morphine administration as can be seen with acute cholecystitis. However, specificity for acute cholecystitis in the limited setting probable hepatitis. 3. Relative photopenia of the hepatic dome is favored to reflect artifact. Electronically Signed   By: Dahlia Bailiff M.D.   On: 07/02/2022 12:24    Anti-infectives: Anti-infectives (From admission, onward)    Start     Dose/Rate Route Frequency Ordered Stop   07/02/22 1600  ceFEPIme (MAXIPIME) 2 g in sodium chloride 0.9 % 100 mL IVPB        2 g 200 mL/hr over 30 Minutes Intravenous Every 8 hours 07/02/22 1432     07/01/22 1600  vancomycin (VANCOREADY) IVPB 1250 mg/250 mL  Status:  Discontinued        1,250 mg 166.7 mL/hr over 90 Minutes Intravenous Every 36 hours 06/30/22 0240 06/30/22 1547   06/30/22 2200  cefTRIAXone (ROCEPHIN) 2 g in sodium chloride 0.9 % 100 mL IVPB  Status:  Discontinued        2 g 200 mL/hr over 30 Minutes Intravenous Every 24 hours 06/30/22 1547 07/02/22 1432   06/30/22 2200  metroNIDAZOLE (FLAGYL) IVPB 500 mg        500 mg 100 mL/hr over 60 Minutes Intravenous Every 12 hours 06/30/22 1547     06/30/22 0800  piperacillin-tazobactam (ZOSYN) IVPB 3.375 g  Status:  Discontinued  3.375 g 12.5 mL/hr over 240 Minutes Intravenous Every 8 hours 06/30/22 0240 06/30/22 1547   06/30/22 0100  piperacillin-tazobactam (ZOSYN) IVPB 3.375 g        3.375 g 100 mL/hr over 30 Minutes Intravenous  Once 06/30/22 0046 06/30/22 0226   06/30/22 0100  vancomycin (VANCOREADY) IVPB 2000 mg/400 mL        2,000 mg 200 mL/hr over 120 Minutes Intravenous  Once 06/30/22 0046 06/30/22 0401       Assessment/Plan: RUQ pain   Elevated LFTs with hyperbilirubinemia Probable Cholecystitis  - RUQ [place] with cholelithiasis and no evidence of cholecystitis  - CT with cholelithiasis  - HIDA positive but no pain or symptoms - chronic vs false positive   FEN: FLD, IVF @50  cc/h VTE: SQH ID: rocephin/flagyl 3/13>>   - below per TRH-  Septic shock with Klebsiella and Enterobacterales bacteremia HTN HLD 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  Okay to advance diet for today and make n.p.o. for tomorrow.  Depending on the OR schedule this weekend, will try to do either Sunday or Monday.  LOS: 3 days    Turner Daniels MD  07/03/2022    I personally saw the patient and performed a substantive portion of the medical decision making, in conjunction with the  I reviewed recent lab values, vitals, and notes.  This care required straight-forward level of medical decision making.

## 2022-07-03 NOTE — Progress Notes (Signed)
[facility] Gastroenterology Progress Note  Catherine Erickson 67 y.o. [birth year]  CC: Abdominal pain, abnormal LFTs   Subjective: Patient seen and examined at bedside.  Denies any further abdominal pain.  Awaiting cholecystectomy today.  ROS : afebrile, negative for chest pain   Objective: Vital signs in last 24 hours: Vitals:   07/03/22 0800 07/03/22 0855  BP:  (!) 143/94  Pulse:  85  Resp: 20 18  Temp:  98.6 F (37 C)  SpO2:  100%    Physical Exam: Morbidly obese patient, not in acute distress.  Abdominal exam benign.   Lab Results: Recent Labs    07/02/22 0331 07/03/22 0445  NA 136 137  K 3.3* 3.5  CL 103 103  CO2 25 25  GLUCOSE 128* 120*  BUN 10 10  CREATININE 1.10* 1.07*  CALCIUM 8.5* 9.4  MG 1.9 2.0   Recent Labs    07/02/22 0331 07/03/22 0445  AST 83* 71*  ALT 121* 103*  ALKPHOS 170* 181*  BILITOT 3.1* 2.5*  PROT 6.8 7.0  ALBUMIN 2.8* 2.9*   Recent Labs    07/02/22 0331 07/03/22 0445  WBC 13.5* 11.1*  HGB 11.4* 12.1  HCT 33.7* 35.7*  MCV 82.6 81.0  PLT 265 298   No results for input(s): "LABPROT", "INR" in the last 72 hours.    Assessment/Plan: -Acute onset of epigastric abdominal pain with abnormal LFTs.  Abdominal pain has resolved.  LFTs improving.  She may have passed gallstone.   -HIDA scan concerning for acute cholecystitis.  Recommendations -------------------------- -Tentative plan for lap chole with possible IOC today.  If IOC negative, no further GI workup will be needed.    Otis Brace MD, [place] 07/03/2022, 9:09 AM  Contact #  [phone]

## 2022-07-03 NOTE — Progress Notes (Signed)
PROGRESS NOTE    Catherine Erickson  [id number] DOB: [birth year] DOA: 06/29/2022 PCP: Pcp, No   Brief Narrative:  67 year old with history of trach dependence, chronic pain, HTN, HLD admitted to the hospital for SIRS.  Patient able to communicate effectively but had difficulty speaking.  She started experiencing epigastric abdominal pain day prior to admission.  Upon admission patient was started on broad-spectrum antibiotics.  Patient was found to have Klebsiella bacteremia.  Transaminitis is improving but the right upper quadrant abdominal pain ultrasound ordered which showed choledocholithiasis without evidence of cholecystitis.  HIDA scan which is consistent with likely acute cholecystitis in the setting of choledocholithiasis.  GI and general surgery consulted, plans for laparoscopic cholecystectomy.  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  CT showing possible pyelonephritis but UA is clean.  Elevated BNP and procalcitonin.  Blood cultures are positive for Klebsiella which I suspect can be urinary or right upper quadrant source and setting of cholelithiasis/acute cholecystitis. - For now we will plan on continuing IV cefepime and Flagyl  Transaminitis, improving Acute cystitis with cholelithiasis -LFTs are improving, right upper quadrant and HIDA scan both are abnormal.  Acute hepatitis panel and lipase levels are negative.  GI and general surgery consulted, planning for laparoscopic cholecystectomy.  Hypokalemia - Aggressive repletion  CKD stage IIIb - Creatinine around baseline of 1.4.  Essential hypertension -  Norvasc.  Resume lisinopril 20 mg daily, holding HCTZ. 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  Subjective: Seen and examined at bedside, feels great  Examination: Constitutional: Not in acute distress Respiratory: Clear to auscultation bilaterally Cardiovascular: Normal sinus rhythm, no rubs Abdomen: Nontender nondistended good bowel sounds Musculoskeletal: No edema noted Skin: No rashes seen Neurologic: CN 2-12 grossly intact.  And nonfocal Psychiatric: Normal judgment and insight. Alert and oriented x 3. Normal mood. Chronic trach in place  Objective: Vitals:   07/03/22 0032 07/03/22 0343 07/03/22 0500 07/03/22 0749  BP: (!) 148/67     Pulse: 85 84    Resp: 19 (!) 25    Temp: 98.4 F (36.9 C)     TempSrc: Oral     SpO2: 98% 98%  99%  Weight:   107.4 kg   Height:        Intake/Output Summary (Last 24 hours) at 07/03/2022 0843 Last data filed at 07/03/2022 [id number] Gross per 24 hour  Intake 2521.14 ml  Output 1000 ml  Net 1521.14 ml   Filed Weights   06/30/22 1238 07/02/22 0500 07/03/22 0500  Weight: 109.2 kg 107.7 kg 107.4 kg     Data Reviewed:   CBC: Recent Labs  Lab 06/29/22 2242 06/30/22 0427 07/01/22 0323 07/02/22 0331 07/03/22 0445  WBC 9.9 31.5* 18.5* 13.5* 11.1*  NEUTROABS 9.3* 28.7*  --   --   --   HGB 14.0 11.4* 12.1 11.4* 12.1  HCT 40.4 34.3* 36.5 33.7* 35.7*  MCV 80.6 82.3 83.5 82.6 81.0  PLT  323 257 262 265 [id number]   Basic Metabolic Panel: Recent Labs  Lab 06/29/22 2242 06/30/22 0042 06/30/22 0427 07/01/22 0323 07/02/22 0331 07/03/22 0445  NA 133*  --  136 140 136 137  K 2.7*  --  2.9* 3.0* 3.3* 3.5  CL 99  --  100 103 103 103  CO2 23  --  24 27 25 25   GLUCOSE 169*  --  157* 130* 128* 120*  BUN 12  --  15 14 10  10   CREATININE 1.39*  --  1.53* 1.32* 1.10* 1.07*  CALCIUM 9.0  --  8.2* 8.7* 8.5* 9.4  MG  --  1.8 1.6*  --  1.9 2.0   GFR: Estimated Creatinine Clearance: 63.6 mL/min (A) (by C-G formula based on SCr of 1.07 mg/dL (H)). Liver Function Tests: Recent Labs  Lab 06/29/22 2242 06/30/22 0427 07/01/22 0830 07/02/22 0331 07/03/22 0445  AST 548* 320* 118* 83* 71*  ALT 307* 238* 155* 121* 103*  ALKPHOS 225* 148* 155* 170* 181*  BILITOT 4.2* 4.2* 3.7* 3.1* 2.5*  PROT 7.9 6.5 6.9 6.8 7.0  ALBUMIN 3.6 2.8* 2.8* 2.8* 2.9*   Recent Labs  Lab 06/29/22 0042 07/01/22 0830  LIPASE 27 23   No results for input(s): "AMMONIA" in the last 168 hours. Coagulation Profile: Recent Labs  Lab 06/29/22 2242  INR 1.1   Cardiac Enzymes: No results for input(s): "CKTOTAL", "CKMB", "CKMBINDEX", "TROPONINI" in the last 168 hours. BNP (last 3 results) No results for input(s): "PROBNP" in the last 8760 hours. HbA1C: No results for input(s): "HGBA1C" in the last 72 hours. CBG: No results for input(s): "GLUCAP" in the last 168 hours. Lipid Profile: No results for input(s): "CHOL", "HDL", "LDLCALC", "TRIG", "CHOLHDL", "LDLDIRECT" in the last 72 hours. Thyroid Function Tests: No results for input(s): "TSH", "T4TOTAL", "FREET4", "T3FREE", "THYROIDAB" in the last 72 hours. Anemia Panel: No results for input(s): "VITAMINB12", "FOLATE", "FERRITIN", "TIBC", "IRON", "RETICCTPCT" in the last 72 hours. Sepsis Labs: Recent Labs  Lab 06/30/22 0100 06/30/22 0851 06/30/22 1240 06/30/22 1541 07/01/22 0830  PROCALCITON  --   --   --   --  23.68  LATICACIDVEN 4.0* 3.6* 1.9 1.4  --     Recent Results (from the past 240 hour(s))  Culture, blood (Routine x 2)     Status: Abnormal (Preliminary result)   Collection Time: 06/29/22 10:55 PM   Specimen: BLOOD LEFT WRIST  Result Value Ref Range Status   Specimen Description   Final    BLOOD LEFT WRIST Performed at [facility]  [address], [place], [place] [address]    Special Requests   Final    BOTTLES DRAWN AEROBIC AND ANAEROBIC Blood Culture adequate volume Performed at [facility] [address], [place], [place] [address]    Culture  Setup Time   Final    GRAM NEGATIVE RODS IN BOTH AEROBIC AND ANAEROBIC BOTTLES CRITICAL RESULT CALLED TO, READ BACK BY AND VERIFIED WITH: PHARMD MICHELLE LILLISTON ON 06/30/22 @ 2321 BY DRT Performed at [facility], [place] [address] [place], [place] [address]    Culture ENTEROBACTER SPECIES (A)  Final   Report Status PENDING  Incomplete   Organism ID, Bacteria ENTEROBACTER SPECIES  Final      Susceptibility   Enterobacter species - MIC*    CEFEPIME <=0.12 SENSITIVE Sensitive     CEFTAZIDIME <=1 SENSITIVE Sensitive     CEFTRIAXONE <=0.25 SENSITIVE Sensitive     CIPROFLOXACIN <=  0.25 SENSITIVE Sensitive     GENTAMICIN <=1 SENSITIVE Sensitive     IMIPENEM 1 SENSITIVE Sensitive     TRIMETH/SULFA <=20 SENSITIVE Sensitive     PIP/TAZO <=4 SENSITIVE Sensitive     * ENTEROBACTER SPECIES  Culture, blood (Routine x 2)     Status: Abnormal (Preliminary result)   Collection Time: 06/29/22 10:55 PM   Specimen: BLOOD  Result Value Ref Range Status   Specimen Description   Final    BLOOD LEFT ANTECUBITAL Performed at [facility], [place] [address], [place], [place] [address]    Special Requests   Final    BOTTLES DRAWN AEROBIC AND ANAEROBIC Blood Culture results may not be optimal due to an inadequate volume of blood received in culture bottles Performed at [facility] [address], [place], [place] [address]    Culture  Setup Time   Final    GRAM NEGATIVE RODS IN BOTH AEROBIC AND ANAEROBIC BOTTLES CRITICAL RESULT CALLED TO, READ BACK BY AND VERIFIED WITH: [place] GC:[id number] BY JRS Performed at [facility], [place] [address] [place], [place] [address]    Culture KLEBSIELLA PNEUMONIAE (A)  Final   Report  Status PENDING  Incomplete   Organism ID, Bacteria KLEBSIELLA PNEUMONIAE  Final      Susceptibility   Klebsiella pneumoniae - MIC*    AMPICILLIN RESISTANT Resistant     CEFEPIME <=0.12 SENSITIVE Sensitive     CEFTAZIDIME <=1 SENSITIVE Sensitive     CEFTRIAXONE <=0.25 SENSITIVE Sensitive     CIPROFLOXACIN <=0.25 SENSITIVE Sensitive     GENTAMICIN <=1 SENSITIVE Sensitive     IMIPENEM <=0.25 SENSITIVE Sensitive     TRIMETH/SULFA <=20 SENSITIVE Sensitive     AMPICILLIN/SULBACTAM <=2 SENSITIVE Sensitive     PIP/TAZO <=4 SENSITIVE Sensitive     * KLEBSIELLA PNEUMONIAE  Blood Culture ID Panel (Reflexed)     Status: Abnormal   Collection Time: 06/29/22 10:55 PM  Result Value Ref Range Status   Enterococcus faecalis NOT DETECTED NOT DETECTED Final   Enterococcus Faecium NOT DETECTED NOT DETECTED Final   Listeria monocytogenes NOT DETECTED NOT DETECTED Final   Staphylococcus species NOT DETECTED NOT DETECTED Final   Staphylococcus aureus (BCID) NOT DETECTED NOT DETECTED Final   Staphylococcus epidermidis NOT DETECTED NOT DETECTED Final   Staphylococcus lugdunensis NOT DETECTED NOT DETECTED Final   Streptococcus species NOT DETECTED NOT DETECTED Final   Streptococcus agalactiae NOT DETECTED NOT DETECTED Final   Streptococcus pneumoniae NOT DETECTED NOT DETECTED Final   Streptococcus pyogenes NOT DETECTED NOT DETECTED Final   A.calcoaceticus-baumannii NOT DETECTED NOT DETECTED Final   Bacteroides fragilis NOT DETECTED NOT DETECTED Final   Enterobacterales DETECTED (A) NOT DETECTED Final    Comment: Enterobacterales represent a large order of gram negative bacteria, not a single organism. CRITICAL RESULT CALLED TO, READ BACK BY AND VERIFIED WITH: PHARMD [place] GC:[id number] BY JRS    Enterobacter cloacae complex NOT DETECTED NOT DETECTED Final   Escherichia coli NOT DETECTED NOT DETECTED Final   Klebsiella aerogenes NOT DETECTED NOT DETECTED Final   Klebsiella oxytoca NOT DETECTED  NOT DETECTED Final   Klebsiella pneumoniae DETECTED (A) NOT DETECTED Final    Comment: CRITICAL RESULT CALLED TO, READ BACK BY AND VERIFIED WITH: PHARMD CHRISTINE SHADE GC:[id number] BY JRS.    Proteus species NOT DETECTED NOT DETECTED Final   Salmonella species NOT DETECTED NOT DETECTED Final   Serratia marcescens NOT DETECTED NOT DETECTED  Final   Haemophilus influenzae NOT DETECTED NOT DETECTED Final   Neisseria meningitidis NOT DETECTED NOT DETECTED Final   Pseudomonas aeruginosa NOT DETECTED NOT DETECTED Final   Stenotrophomonas maltophilia NOT DETECTED NOT DETECTED Final   Candida albicans NOT DETECTED NOT DETECTED Final   Candida auris NOT DETECTED NOT DETECTED Final   Candida glabrata NOT DETECTED NOT DETECTED Final   Candida krusei NOT DETECTED NOT DETECTED Final   Candida parapsilosis NOT DETECTED NOT DETECTED Final   Candida tropicalis NOT DETECTED NOT DETECTED Final   Cryptococcus neoformans/gattii NOT DETECTED NOT DETECTED Final   CTX-M ESBL NOT DETECTED NOT DETECTED Final   Carbapenem resistance IMP NOT DETECTED NOT DETECTED Final   Carbapenem resistance KPC NOT DETECTED NOT DETECTED Final   Carbapenem resistance NDM NOT DETECTED NOT DETECTED Final   Carbapenem resist OXA 48 LIKE NOT DETECTED NOT DETECTED Final   Carbapenem resistance VIM NOT DETECTED NOT DETECTED Final    Comment: Performed at [facility], [address] [place], [place] [address]  MRSA Next Gen by PCR, Nasal     Status: None   Collection Time: 06/30/22 12:26 PM   Specimen: Nasal Mucosa; Nasal Swab  Result Value Ref Range Status   MRSA by PCR Next Gen NOT DETECTED NOT DETECTED Final    Comment: (NOTE) The GeneXpert MRSA Assay (FDA approved for NASAL specimens only), is one component of a comprehensive MRSA colonization surveillance program. It is not intended to diagnose MRSA infection nor to guide or monitor treatment for MRSA infections. Test performance is not FDA approved in patients less than  58 years old. Performed at [facility], [place] [address], [place],  [address]          Radiology Studies: NM Hepatobiliary Liver Func  Result Date: 07/02/2022 CLINICAL DATA:  Cholelithiasis concern for acute cholecystitis. Elevated LFTs. EXAM: NUCLEAR MEDICINE HEPATOBILIARY IMAGING TECHNIQUE: Sequential images of the abdomen were obtained out to 60 minutes following intravenous administration of radiopharmaceutical. An additional 30 minutes of scintigraphic imaging was obtained post 3 mg administration of IV morphine. RADIOPHARMACEUTICALS:  6.8 mCi Tc-75m  Choletec IV COMPARISON:  CT abdomen pelvis June 30, 2022 and ultrasound June 29, 2022 FINDINGS: Slightly prolonged radiotracer clearance from blood pool. Relative photopenia of the hepatic dome with delayed hepatocyte radiotracer clearance. Gallbladder activity is not visualized pre or post morphine administration. Biliary activity passes into small bowel, consistent with patent common bile duct. IMPRESSION: 1. Delayed hepatic radiotracer clearance with slightly prolonged clearance from blood pool suggestive of hepatitis. 2. Gallbladder activity is not visualized pre or post morphine administration as can be seen with acute cholecystitis. However, specificity for acute cholecystitis in the limited setting probable hepatitis. 3. Relative photopenia of the hepatic dome is favored to reflect artifact. Electronically Signed   By: Dahlia Bailiff M.D.   On: 07/02/2022 12:24        Scheduled Meds:  amLODipine  10 mg Oral Daily   heparin injection (subcutaneous)  5,000 Units Subcutaneous Q8H   ipratropium-albuterol  3 mL Nebulization BID   lisinopril  20 mg Oral Daily   morphine (PF)        morphine injection  3 mg Intravenous Once   mouth rinse  15 mL Mouth Rinse 4 times per day   Continuous Infusions:  ceFEPime (MAXIPIME) IV 2 g (07/03/22 [id number])   metronidazole 500 mg (07/02/22 2220)     LOS: 3 days   Time  spent= 35 mins  Michi Herrmann Arsenio Loader, MD [facility]  If 7PM-7AM, please contact night-coverage  07/03/2022, 8:43 AM

## 2022-07-04 DIAGNOSIS — A419 Sepsis, unspecified organism: Secondary | ICD-10-CM | POA: Diagnosis not present

## 2022-07-04 DIAGNOSIS — R6521 Severe sepsis with septic shock: Secondary | ICD-10-CM | POA: Diagnosis not present

## 2022-07-04 LAB — COMPREHENSIVE METABOLIC PANEL
ALT: 82 U/L — ABNORMAL HIGH (ref 0–44)
AST: 54 U/L — ABNORMAL HIGH (ref 15–41)
Albumin: 2.9 g/dL — ABNORMAL LOW (ref 3.5–5.0)
Alkaline Phosphatase: 197 U/L — ABNORMAL HIGH (ref 38–126)
Anion gap: 10 (ref 5–15)
BUN: 14 mg/dL (ref 8–23)
CO2: 23 mmol/L (ref 22–32)
Calcium: 9 mg/dL (ref 8.9–10.3)
Chloride: 105 mmol/L (ref 98–111)
Creatinine, Ser: 1.07 mg/dL — ABNORMAL HIGH (ref 0.44–1.00)
GFR, Estimated: 58 mL/min — ABNORMAL LOW (ref >60)
Glucose, Bld: 93 mg/dL (ref 70–99)
Potassium: 3.4 mmol/L — ABNORMAL LOW (ref 3.5–5.1)
Sodium: 138 mmol/L (ref 135–145)
Total Bilirubin: 2.1 mg/dL — ABNORMAL HIGH (ref 0.3–1.2)
Total Protein: 6.9 g/dL (ref 6.5–8.1)

## 2022-07-04 LAB — CBC
HCT: 35.4 % — ABNORMAL LOW (ref 36.0–46.0)
Hemoglobin: 12.3 g/dL (ref 12.0–15.0)
MCH: 27.8 pg (ref 26.0–34.0)
MCHC: 34.7 g/dL (ref 30.0–36.0)
MCV: 79.9 fL — ABNORMAL LOW (ref 80.0–100.0)
Platelets: 294 10*3/uL (ref 150–400)
RBC: 4.43 MIL/uL (ref 3.87–5.11)
RDW: 15.1 % (ref 11.5–15.5)
WBC: 13.2 10*3/uL — ABNORMAL HIGH (ref 4.0–10.5)
nRBC: 0 % (ref 0.0–0.2)

## 2022-07-04 LAB — CULTURE, BLOOD (ROUTINE X 2)

## 2022-07-04 LAB — MAGNESIUM: Magnesium: 2.1 mg/dL (ref 1.7–2.4)

## 2022-07-04 MED ORDER — POTASSIUM CHLORIDE 10 MEQ/100ML IV SOLN
10.0000 meq | INTRAVENOUS | Status: AC
  Administered 2022-07-04 (×4): 10 meq via INTRAVENOUS
  Filled 2022-07-04 (×4): qty 100

## 2022-07-04 MED ORDER — CHLORHEXIDINE GLUCONATE CLOTH 2 % EX PADS
6.0000 | MEDICATED_PAD | Freq: Once | CUTANEOUS | Status: AC
  Administered 2022-07-05: 6 via TOPICAL

## 2022-07-04 MED ORDER — CHLORHEXIDINE GLUCONATE CLOTH 2 % EX PADS
6.0000 | MEDICATED_PAD | Freq: Once | CUTANEOUS | Status: AC
  Administered 2022-07-04: 6 via TOPICAL

## 2022-07-04 NOTE — Progress Notes (Signed)
PROGRESS NOTE    Catherine Erickson  [id number] DOB: 1955-07-25 DOA: 06/29/2022 PCP: Pcp, No   Brief Narrative:  67 year old with history of trach dependence, chronic pain, HTN, HLD admitted to the hospital for SIRS.  Patient able to communicate effectively but had difficulty speaking.  She started experiencing epigastric abdominal pain day prior to admission.  Upon admission patient was started on broad-spectrum antibiotics.  Patient was found to have Klebsiella bacteremia.  Transaminitis is improving but the right upper quadrant abdominal pain ultrasound ordered which showed choledocholithiasis without evidence of cholecystitis.  HIDA scan which is consistent with likely acute cholecystitis in the setting of choledocholithiasis.  GI and general surgery consulted, plans for laparoscopic cholecystectomy.  Assessment & Plan:  Principal Problem:   Septic shock ([place]) Active Problems:   Essential hypertension   Abdominal pain   Fever   Tachycardia   Leukocytosis   Hypokalemia   Hypomagnesemia   Tracheostomy dependence (HCC)   Lactic acidosis   LFT elevation   Septic shock (HCC) secondary to possible pneumonia, improving Klebsiella bacteremia, prelim cultures - Sepsis physiology has slowly improved.  CT showing possible pyelonephritis but UA is clean.  Elevated BNP and procalcitonin.  Blood cultures are positive for Klebsiella which I suspect can be urinary or right upper quadrant source and setting of cholelithiasis/acute cholecystitis. - For now we will plan on continuing IV cefepime and Flagyl  Transaminitis, improving Acute cystitis with cholelithiasis -LFTs are improving, right upper quadrant and HIDA scan both are abnormal.  Acute hepatitis panel and lipase levels are negative.  GI and general surgery consulted, planning for laparoscopic cholecystectomy tomorrow  Hypokalemia - Aggressive repletion  CKD stage IIIb - Creatinine around baseline of 1.4.  Essential hypertension -  Norvasc.  Resume lisinopril 20 mg daily, holding HCTZ. IV as needed  Tracheostomy dependence ([place]) -Routine care  Hyperlipidemia - Hold statin while LFTs are elevated  DVT prophylaxis: SQ Heparin Code Status: Full Family Communication:    Status is: Inpatient Continue hospital stay due to multiple ongoing issues.  Subjective: Feels great no complaints.  Even though she has chronic trach in place, she communicates very effectively  Examination: Constitutional: Not in acute distress, very pleasant Respiratory: Clear to auscultation bilaterally Cardiovascular: Normal sinus rhythm, no rubs Abdomen: Nontender nondistended good bowel sounds Musculoskeletal: No edema noted Skin: No rashes seen Neurologic: CN 2-12 grossly intact.  And nonfocal Psychiatric: Normal judgment and insight. Alert and oriented x 3. Normal mood. Chronic trach in place  Objective: Vitals:   07/04/22 0435 07/04/22 0500 07/04/22 0623 07/04/22 0823  BP:   (!) 155/86   Pulse: 85  87   Resp: 18  20 19   Temp:   98.1 F (36.7 C)   TempSrc:   Oral   SpO2: 92%  96%   Weight:  104.3 kg    Height:        Intake/Output Summary (Last 24 hours) at 07/04/2022 1204 Last data filed at 07/04/2022 [id number] Gross per 24 hour  Intake 760 ml  Output 800 ml  Net -40 ml   Filed Weights   07/02/22 0500 07/03/22 0500 07/04/22 0500  Weight: 107.7 kg 107.4 kg 104.3 kg     Data Reviewed:   CBC: Recent Labs  Lab 06/29/22 2242 06/30/22 0427 07/01/22 0323 07/02/22 0331 07/03/22 0445 07/04/22 0413  WBC 9.9 31.5* 18.5* 13.5* 11.1* 13.2*  NEUTROABS 9.3* 28.7*  --   --   --   --   HGB 14.0 11.4* 12.1  11.4* 12.1 12.3  HCT 40.4 34.3* 36.5 33.7* 35.7* 35.4*  MCV 80.6 82.3 83.5 82.6 81.0 79.9*  PLT 323 257 262 265 298 [id number]   Basic Metabolic Panel: Recent Labs  Lab 06/30/22 0042 06/30/22 0427 07/01/22 0323 07/02/22 0331 07/03/22 0445 07/04/22 0413  NA  --  136 140 136 137 138  K  --  2.9* 3.0* 3.3* 3.5 3.4*  CL   --  100 103 103 103 105  CO2  --  24 27 25 25 23   GLUCOSE  --  157* 130* 128* 120* 93  BUN  --  15 14 10 10 14   CREATININE  --  1.53* 1.32* 1.10* 1.07* 1.07*  CALCIUM  --  8.2* 8.7* 8.5* 9.4 9.0  MG 1.8 1.6*  --  1.9 2.0 2.1   GFR: Estimated Creatinine Clearance: 62.5 mL/min (A) (by C-G formula based on SCr of 1.07 mg/dL (H)). Liver Function Tests: Recent Labs  Lab 06/30/22 0427 07/01/22 0830 07/02/22 0331 07/03/22 0445 07/04/22 0413  AST 320* 118* 83* 71* 54*  ALT 238* 155* 121* 103* 82*  ALKPHOS 148* 155* 170* 181* 197*  BILITOT 4.2* 3.7* 3.1* 2.5* 2.1*  PROT 6.5 6.9 6.8 7.0 6.9  ALBUMIN 2.8* 2.8* 2.8* 2.9* 2.9*   Recent Labs  Lab 06/29/22 0042 07/01/22 0830  LIPASE 27 23   No results for input(s): "AMMONIA" in the last 168 hours. Coagulation Profile: Recent Labs  Lab 06/29/22 2242  INR 1.1   Cardiac Enzymes: No results for input(s): "CKTOTAL", "CKMB", "CKMBINDEX", "TROPONINI" in the last 168 hours. BNP (last 3 results) No results for input(s): "PROBNP" in the last 8760 hours. HbA1C: No results for input(s): "HGBA1C" in the last 72 hours. CBG: No results for input(s): "GLUCAP" in the last 168 hours. Lipid Profile: No results for input(s): "CHOL", "HDL", "LDLCALC", "TRIG", "CHOLHDL", "LDLDIRECT" in the last 72 hours. Thyroid Function Tests: No results for input(s): "TSH", "T4TOTAL", "FREET4", "T3FREE", "THYROIDAB" in the last 72 hours. Anemia Panel: No results for input(s): "VITAMINB12", "FOLATE", "FERRITIN", "TIBC", "IRON", "RETICCTPCT" in the last 72 hours. Sepsis Labs: Recent Labs  Lab 06/30/22 0100 06/30/22 0851 06/30/22 1240 06/30/22 1541 07/01/22 0830  PROCALCITON  --   --   --   --  23.68  LATICACIDVEN 4.0* 3.6* 1.9 1.4  --     Recent Results (from the past 240 hour(s))  Culture, blood (Routine x 2)     Status: Abnormal   Collection Time: 06/29/22 10:55 PM   Specimen: BLOOD LEFT WRIST  Result Value Ref Range Status   Specimen Description    Final    BLOOD LEFT WRIST Performed at [facility] [address], [place], [place] [address]    Special Requests   Final    BOTTLES DRAWN AEROBIC AND ANAEROBIC Blood Culture adequate volume Performed at [facility] [address], [place], [place] [address]    Culture  Setup Time   Final    GRAM NEGATIVE RODS IN BOTH AEROBIC AND ANAEROBIC BOTTLES CRITICAL RESULT CALLED TO, READ BACK BY AND VERIFIED WITH: PHARMD MICHELLE LILLISTON ON 06/30/22 @ 2321 BY DRT Performed at [facility], [place] [address], [place], [place] [address]    Culture ENTEROBACTER SPECIES (A)  Final   Report Status 07/04/2022 FINAL  Final   Organism ID, Bacteria ENTEROBACTER SPECIES  Final      Susceptibility   Enterobacter species - MIC*    CEFEPIME <=0.12 SENSITIVE Sensitive  CEFTAZIDIME <=1 SENSITIVE Sensitive     CEFTRIAXONE <=0.25 SENSITIVE Sensitive     CIPROFLOXACIN <=0.25 SENSITIVE Sensitive     GENTAMICIN <=1 SENSITIVE Sensitive     IMIPENEM 1 SENSITIVE Sensitive     TRIMETH/SULFA <=20 SENSITIVE Sensitive     PIP/TAZO <=4 SENSITIVE Sensitive     * ENTEROBACTER SPECIES  Culture, blood (Routine x 2)     Status: Abnormal   Collection Time: 06/29/22 10:55 PM   Specimen: BLOOD  Result Value Ref Range Status   Specimen Description   Final    BLOOD LEFT ANTECUBITAL Performed at [facility], [place] [address] [place], [place] [address]    Special Requests   Final    BOTTLES DRAWN AEROBIC AND ANAEROBIC Blood Culture results may not be optimal due to an inadequate volume of blood received in culture bottles Performed at [facility] [address], [place], [place] [address]    Culture  Setup Time   Final    GRAM NEGATIVE RODS IN BOTH AEROBIC AND ANAEROBIC BOTTLES CRITICAL RESULT CALLED TO, READ BACK BY AND VERIFIED WITH: [place] GC:[id number] BY JRS Performed at [facility], [place] [address], [place],  [place] [address]    Culture KLEBSIELLA PNEUMONIAE (A)  Final   Report Status 07/04/2022 FINAL  Final   Organism ID, Bacteria KLEBSIELLA PNEUMONIAE  Final      Susceptibility   Klebsiella pneumoniae - MIC*    AMPICILLIN RESISTANT Resistant     CEFEPIME <=0.12 SENSITIVE Sensitive     CEFTAZIDIME <=1 SENSITIVE Sensitive     CEFTRIAXONE <=0.25 SENSITIVE Sensitive     CIPROFLOXACIN <=0.25 SENSITIVE Sensitive     GENTAMICIN <=1 SENSITIVE Sensitive     IMIPENEM <=0.25 SENSITIVE Sensitive     TRIMETH/SULFA <=20 SENSITIVE Sensitive     AMPICILLIN/SULBACTAM <=2 SENSITIVE Sensitive     PIP/TAZO <=4 SENSITIVE Sensitive     * KLEBSIELLA PNEUMONIAE  Blood Culture ID Panel (Reflexed)     Status: Abnormal   Collection Time: 06/29/22 10:55 PM  Result Value Ref Range Status   Enterococcus faecalis NOT DETECTED NOT DETECTED Final   Enterococcus Faecium NOT DETECTED NOT DETECTED Final   Listeria monocytogenes NOT DETECTED NOT DETECTED Final   Staphylococcus species NOT DETECTED NOT DETECTED Final   Staphylococcus aureus (BCID) NOT DETECTED NOT DETECTED Final   Staphylococcus epidermidis NOT DETECTED NOT DETECTED Final   Staphylococcus lugdunensis NOT DETECTED NOT DETECTED Final   Streptococcus species NOT DETECTED NOT DETECTED Final   Streptococcus agalactiae NOT DETECTED NOT DETECTED Final   Streptococcus pneumoniae NOT DETECTED NOT DETECTED Final   Streptococcus pyogenes NOT DETECTED NOT DETECTED Final   A.calcoaceticus-baumannii NOT DETECTED NOT DETECTED Final   Bacteroides fragilis NOT DETECTED NOT DETECTED Final   Enterobacterales DETECTED (A) NOT DETECTED Final    Comment: Enterobacterales represent a large order of gram negative bacteria, not a single organism. CRITICAL RESULT CALLED TO, READ BACK BY AND VERIFIED WITH: PHARMD [place] GC:[id number] BY JRS    Enterobacter cloacae complex NOT DETECTED NOT DETECTED Final   Escherichia coli NOT DETECTED NOT DETECTED Final   Klebsiella  aerogenes NOT DETECTED NOT DETECTED Final   Klebsiella oxytoca NOT DETECTED NOT DETECTED Final   Klebsiella pneumoniae DETECTED (A) NOT DETECTED Final    Comment: CRITICAL RESULT CALLED TO, READ BACK BY AND VERIFIED WITH: PHARMD CHRISTINE [place] GC:[id number] BY JRS.    Proteus species NOT DETECTED NOT DETECTED Final  Salmonella species NOT DETECTED NOT DETECTED Final   Serratia marcescens NOT DETECTED NOT DETECTED Final   Haemophilus influenzae NOT DETECTED NOT DETECTED Final   Neisseria meningitidis NOT DETECTED NOT DETECTED Final   Pseudomonas aeruginosa NOT DETECTED NOT DETECTED Final   Stenotrophomonas maltophilia NOT DETECTED NOT DETECTED Final   Candida albicans NOT DETECTED NOT DETECTED Final   Candida auris NOT DETECTED NOT DETECTED Final   Candida glabrata NOT DETECTED NOT DETECTED Final   Candida krusei NOT DETECTED NOT DETECTED Final   Candida parapsilosis NOT DETECTED NOT DETECTED Final   Candida tropicalis NOT DETECTED NOT DETECTED Final   Cryptococcus neoformans/gattii NOT DETECTED NOT DETECTED Final   CTX-M ESBL NOT DETECTED NOT DETECTED Final   Carbapenem resistance IMP NOT DETECTED NOT DETECTED Final   Carbapenem resistance KPC NOT DETECTED NOT DETECTED Final   Carbapenem resistance NDM NOT DETECTED NOT DETECTED Final   Carbapenem resist OXA 48 LIKE NOT DETECTED NOT DETECTED Final   Carbapenem resistance VIM NOT DETECTED NOT DETECTED Final    Comment: Performed at [facility], [address], [place], [place] [address]  MRSA Next Gen by PCR, Nasal     Status: None   Collection Time: 06/30/22 12:26 PM   Specimen: Nasal Mucosa; Nasal Swab  Result Value Ref Range Status   MRSA by PCR Next Gen NOT DETECTED NOT DETECTED Final    Comment: (NOTE) The GeneXpert MRSA Assay (FDA approved for NASAL specimens only), is one component of a comprehensive MRSA colonization surveillance program. It is not intended to diagnose MRSA infection nor to guide or monitor treatment for  MRSA infections. Test performance is not FDA approved in patients less than 60 years old. Performed at [facility], [place], [place] [address], [place], [place] [address]          Radiology Studies: No results found.      Scheduled Meds:  amLODipine  10 mg Oral Daily   Chlorhexidine Gluconate Cloth  6 each Topical Daily   Chlorhexidine Gluconate Cloth  6 each Topical Once   heparin injection (subcutaneous)  5,000 Units Subcutaneous Q8H   ipratropium-albuterol  3 mL Nebulization BID   lisinopril  20 mg Oral Daily   morphine (PF)        morphine injection  3 mg Intravenous Once   mouth rinse  15 mL Mouth Rinse 4 times per day   Continuous Infusions:  ceFEPime (MAXIPIME) IV 2 g (07/04/22 0856)   metronidazole 500 mg (07/04/22 1019)   potassium chloride 10 mEq (07/04/22 1143)     LOS: 4 days   Time spent= 35 mins    Persia Lintner Arsenio Loader, MD [facility]  If 7PM-7AM, please contact night-coverage  07/04/2022, 12:04 PM

## 2022-07-04 NOTE — Plan of Care (Signed)

## 2022-07-04 NOTE — Progress Notes (Signed)
Subjective/Chief Complaint: Patient denies abdominal pain.  She is tolerating her diet.   Objective: Vital signs in last 24 hours: Temp:  [98.1 F (36.7 C)-98.6 F (37 C)] 98.1 F (36.7 C) (03/17 0623) Pulse Rate:  [85-94] 87 (03/17 0623) Resp:  [17-20] 20 (03/17 0623) BP: (143-175)/(86-94) 155/86 (03/17 0623) SpO2:  [92 %-100 %] 96 % (03/17 0623) Weight:  [104.3 kg] 104.3 kg (03/17 0500) Last BM Date : 07/03/22  Intake/Output from previous day: 03/16 0701 - 03/17 0700 In: 760 [P.O.:360; IV Piggyback:400] Out: 1600 [Urine:1600] Intake/Output this shift: No intake/output data recorded.  GI: Soft nontender without rebound guarding.  Negative Murphy sign.  Lab Results:  Recent Labs    07/03/22 0445 07/04/22 0413  WBC 11.1* 13.2*  HGB 12.1 12.3  HCT 35.7* 35.4*  PLT 298 294   BMET Recent Labs    07/03/22 0445 07/04/22 0413  NA 137 138  K 3.5 3.4*  CL 103 105  CO2 25 23  GLUCOSE 120* 93  BUN 10 14  CREATININE 1.07* 1.07*  CALCIUM 9.4 9.0   PT/INR No results for input(s): "LABPROT", "INR" in the last 72 hours. ABG No results for input(s): "PHART", "HCO3" in the last 72 hours.  Invalid input(s): "PCO2", "PO2"  Studies/Results: NM Hepatobiliary Liver Func  Result Date: 07/02/2022 CLINICAL DATA:  Cholelithiasis concern for acute cholecystitis. Elevated LFTs. EXAM: NUCLEAR MEDICINE HEPATOBILIARY IMAGING TECHNIQUE: Sequential images of the abdomen were obtained out to 60 minutes following intravenous administration of radiopharmaceutical. An additional 30 minutes of scintigraphic imaging was obtained post 3 mg administration of IV morphine. RADIOPHARMACEUTICALS:  6.8 mCi Tc-28m  Choletec IV COMPARISON:  CT abdomen pelvis June 30, 2022 and ultrasound June 29, 2022 FINDINGS: Slightly prolonged radiotracer clearance from blood pool. Relative photopenia of the hepatic dome with delayed hepatocyte radiotracer clearance. Gallbladder activity is not visualized pre or  post morphine administration. Biliary activity passes into small bowel, consistent with patent common bile duct. IMPRESSION: 1. Delayed hepatic radiotracer clearance with slightly prolonged clearance from blood pool suggestive of hepatitis. 2. Gallbladder activity is not visualized pre or post morphine administration as can be seen with acute cholecystitis. However, specificity for acute cholecystitis in the limited setting probable hepatitis. 3. Relative photopenia of the hepatic dome is favored to reflect artifact. Electronically Signed   By: Dahlia Bailiff M.D.   On: 07/02/2022 12:24    Anti-infectives: Anti-infectives (From admission, onward)    Start     Dose/Rate Route Frequency Ordered Stop   07/02/22 1600  ceFEPIme (MAXIPIME) 2 g in sodium chloride 0.9 % 100 mL IVPB        2 g 200 mL/hr over 30 Minutes Intravenous Every 8 hours 07/02/22 1432     07/01/22 1600  vancomycin (VANCOREADY) IVPB 1250 mg/250 mL  Status:  Discontinued        1,250 mg 166.7 mL/hr over 90 Minutes Intravenous Every 36 hours 06/30/22 0240 06/30/22 1547   06/30/22 2200  cefTRIAXone (ROCEPHIN) 2 g in sodium chloride 0.9 % 100 mL IVPB  Status:  Discontinued        2 g 200 mL/hr over 30 Minutes Intravenous Every 24 hours 06/30/22 1547 07/02/22 1432   06/30/22 2200  metroNIDAZOLE (FLAGYL) IVPB 500 mg        500 mg 100 mL/hr over 60 Minutes Intravenous Every 12 hours 06/30/22 1547     06/30/22 0800  piperacillin-tazobactam (ZOSYN) IVPB 3.375 g  Status:  Discontinued  3.375 g 12.5 mL/hr over 240 Minutes Intravenous Every 8 hours 06/30/22 0240 06/30/22 1547   06/30/22 0100  piperacillin-tazobactam (ZOSYN) IVPB 3.375 g        3.375 g 100 mL/hr over 30 Minutes Intravenous  Once 06/30/22 0046 06/30/22 0226   06/30/22 0100  vancomycin (VANCOREADY) IVPB 2000 mg/400 mL        2,000 mg 200 mL/hr over 120 Minutes Intravenous  Once 06/30/22 0046 06/30/22 0401       Assessment/Plan: Chronic cholecystitis with elevated  liver function studies that are improving  Klebsiella bacteremia  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 Marcello Moores on Monday  Discussed with patient  Resume diet for today and n.p.o. after midnight       LOS: 4 days    Joyice Faster Eloni Darius MD 07/04/2022  I reviewed recent lab values, vitals, and notes.  This care required straight-forward level of medical decision making.

## 2022-07-05 ENCOUNTER — Encounter (HOSPITAL_COMMUNITY): Payer: Self-pay | Admitting: Internal Medicine

## 2022-07-05 ENCOUNTER — Inpatient Hospital Stay (HOSPITAL_COMMUNITY): Payer: Medicaid Other | Admitting: Anesthesiology

## 2022-07-05 ENCOUNTER — Encounter (HOSPITAL_COMMUNITY)
Admission: EM | Disposition: A | Discharge status: Home / Self Care | Payer: Self-pay | Source: Home / Self Care | Attending: Internal Medicine

## 2022-07-05 DIAGNOSIS — K810 Acute cholecystitis: Secondary | ICD-10-CM

## 2022-07-05 DIAGNOSIS — A419 Sepsis, unspecified organism: Secondary | ICD-10-CM | POA: Diagnosis not present

## 2022-07-05 DIAGNOSIS — I10 Essential (primary) hypertension: Secondary | ICD-10-CM

## 2022-07-05 DIAGNOSIS — R6521 Severe sepsis with septic shock: Secondary | ICD-10-CM | POA: Diagnosis not present

## 2022-07-05 DIAGNOSIS — Z87891 Personal history of nicotine dependence: Secondary | ICD-10-CM

## 2022-07-05 HISTORY — PX: CHOLECYSTECTOMY: SHX55

## 2022-07-05 LAB — COMPREHENSIVE METABOLIC PANEL
ALT: 69 U/L — ABNORMAL HIGH (ref 0–44)
AST: 53 U/L — ABNORMAL HIGH (ref 15–41)
Albumin: 3.1 g/dL — ABNORMAL LOW (ref 3.5–5.0)
Alkaline Phosphatase: 221 U/L — ABNORMAL HIGH (ref 38–126)
Anion gap: 9 (ref 5–15)
BUN: 16 mg/dL (ref 8–23)
CO2: 22 mmol/L (ref 22–32)
Calcium: 9.3 mg/dL (ref 8.9–10.3)
Chloride: 107 mmol/L (ref 98–111)
Creatinine, Ser: 1.1 mg/dL — ABNORMAL HIGH (ref 0.44–1.00)
GFR, Estimated: 56 mL/min — ABNORMAL LOW (ref >60)
Glucose, Bld: 105 mg/dL — ABNORMAL HIGH (ref 70–99)
Potassium: 3.6 mmol/L (ref 3.5–5.1)
Sodium: 138 mmol/L (ref 135–145)
Total Bilirubin: 1.6 mg/dL — ABNORMAL HIGH (ref 0.3–1.2)
Total Protein: 7.2 g/dL (ref 6.5–8.1)

## 2022-07-05 LAB — CBC
HCT: 37.4 % (ref 36.0–46.0)
Hemoglobin: 12.5 g/dL (ref 12.0–15.0)
MCH: 27.4 pg (ref 26.0–34.0)
MCHC: 33.4 g/dL (ref 30.0–36.0)
MCV: 81.8 fL (ref 80.0–100.0)
Platelets: 318 10*3/uL (ref 150–400)
RBC: 4.57 MIL/uL (ref 3.87–5.11)
RDW: 15.8 % — ABNORMAL HIGH (ref 11.5–15.5)
WBC: 17.1 10*3/uL — ABNORMAL HIGH (ref 4.0–10.5)
nRBC: 0 % (ref 0.0–0.2)

## 2022-07-05 LAB — MAGNESIUM: Magnesium: 2.1 mg/dL (ref 1.7–2.4)

## 2022-07-05 SURGERY — LAPAROSCOPIC CHOLECYSTECTOMY WITH INTRAOPERATIVE CHOLANGIOGRAM
Anesthesia: General | Site: Abdomen

## 2022-07-05 MED ORDER — MIDAZOLAM HCL 5 MG/5ML IJ SOLN
INTRAMUSCULAR | Status: DC | PRN
  Administered 2022-07-05: 2 mg via INTRAVENOUS

## 2022-07-05 MED ORDER — ONDANSETRON HCL 4 MG/2ML IJ SOLN
INTRAMUSCULAR | Status: DC | PRN
  Administered 2022-07-05: 4 mg via INTRAVENOUS

## 2022-07-05 MED ORDER — FENTANYL CITRATE (PF) 100 MCG/2ML IJ SOLN
INTRAMUSCULAR | Status: AC
  Filled 2022-07-05: qty 2

## 2022-07-05 MED ORDER — PROPOFOL 10 MG/ML IV BOLUS
INTRAVENOUS | Status: DC | PRN
  Administered 2022-07-05: 160 mg via INTRAVENOUS

## 2022-07-05 MED ORDER — FENTANYL CITRATE PF 50 MCG/ML IJ SOSY
25.0000 ug | PREFILLED_SYRINGE | INTRAMUSCULAR | Status: DC | PRN
  Administered 2022-07-05 (×2): 50 ug via INTRAVENOUS

## 2022-07-05 MED ORDER — PROMETHAZINE HCL 25 MG/ML IJ SOLN: 6.2500 mg | INTRAMUSCULAR | Status: DC | PRN

## 2022-07-05 MED ORDER — PROPOFOL 10 MG/ML IV BOLUS
INTRAVENOUS | Status: AC
  Filled 2022-07-05: qty 20

## 2022-07-05 MED ORDER — MIDAZOLAM HCL 2 MG/2ML IJ SOLN
INTRAMUSCULAR | Status: AC
  Filled 2022-07-05: qty 2

## 2022-07-05 MED ORDER — LACTATED RINGERS IV SOLN
INTRAVENOUS | Status: AC | PRN
  Administered 2022-07-05: 1000 mL

## 2022-07-05 MED ORDER — ROCURONIUM 10MG/ML (10ML) SYRINGE FOR MEDFUSION PUMP - OPTIME
INTRAVENOUS | Status: DC | PRN
  Administered 2022-07-05: 60 mg via INTRAVENOUS

## 2022-07-05 MED ORDER — SUGAMMADEX SODIUM 200 MG/2ML IV SOLN
INTRAVENOUS | Status: DC | PRN
  Administered 2022-07-05: 300 mg via INTRAVENOUS

## 2022-07-05 MED ORDER — ROCURONIUM BROMIDE 10 MG/ML (PF) SYRINGE
PREFILLED_SYRINGE | INTRAVENOUS | Status: AC
  Filled 2022-07-05: qty 10

## 2022-07-05 MED ORDER — FENTANYL CITRATE PF 50 MCG/ML IJ SOSY
PREFILLED_SYRINGE | INTRAMUSCULAR | Status: AC
  Administered 2022-07-05: 50 ug via INTRAVENOUS
  Filled 2022-07-05: qty 3

## 2022-07-05 MED ORDER — BUPIVACAINE-EPINEPHRINE 0.25% -1:200000 IJ SOLN
INTRAMUSCULAR | Status: DC | PRN
  Administered 2022-07-05: 30 mL

## 2022-07-05 MED ORDER — ACETAMINOPHEN 500 MG PO TABS
1000.0000 mg | ORAL_TABLET | Freq: Once | ORAL | Status: AC
  Administered 2022-07-05: 1000 mg via ORAL
  Filled 2022-07-05: qty 2

## 2022-07-05 MED ORDER — BUPIVACAINE-EPINEPHRINE (PF) 0.25% -1:200000 IJ SOLN
INTRAMUSCULAR | Status: AC
  Filled 2022-07-05: qty 30

## 2022-07-05 MED ORDER — LACTATED RINGERS IV SOLN: INTRAVENOUS | Status: DC

## 2022-07-05 MED ORDER — 0.9 % SODIUM CHLORIDE (POUR BTL) OPTIME
TOPICAL | Status: DC | PRN
  Administered 2022-07-05: 1000 mL

## 2022-07-05 MED ORDER — FENTANYL CITRATE (PF) 100 MCG/2ML IJ SOLN
INTRAMUSCULAR | Status: DC | PRN
  Administered 2022-07-05: 50 ug via INTRAVENOUS
  Administered 2022-07-05: 75 ug via INTRAVENOUS
  Administered 2022-07-05: 50 ug via INTRAVENOUS
  Administered 2022-07-05: 25 ug via INTRAVENOUS

## 2022-07-05 MED ORDER — DEXAMETHASONE SODIUM PHOSPHATE 10 MG/ML IJ SOLN
INTRAMUSCULAR | Status: DC | PRN
  Administered 2022-07-05: 10 mg via INTRAVENOUS

## 2022-07-05 MED ORDER — CHLORHEXIDINE GLUCONATE 0.12 % MT SOLN
15.0000 mL | Freq: Once | OROMUCOSAL | Status: AC
  Administered 2022-07-05: 15 mL via OROMUCOSAL

## 2022-07-05 MED ORDER — LIDOCAINE HCL (CARDIAC) PF 100 MG/5ML IV SOSY
PREFILLED_SYRINGE | INTRAVENOUS | Status: DC | PRN
  Administered 2022-07-05: 80 mg via INTRATRACHEAL

## 2022-07-05 SURGICAL SUPPLY — 43 items
ADH SKN CLS APL DERMABOND .7 (GAUZE/BANDAGES/DRESSINGS) ×1
APL PRP STRL LF DISP 70% ISPRP (MISCELLANEOUS) ×1
APPLIER CLIP 5 13 M/L LIGAMAX5 (MISCELLANEOUS) ×1
APR CLP MED LRG 5 ANG JAW (MISCELLANEOUS) ×1
BAG COUNTER SPONGE SURGICOUNT (BAG) IMPLANT
BAG SPNG CNTER NS LX DISP (BAG)
CABLE HIGH FREQUENCY MONO STRZ (ELECTRODE) ×2 IMPLANT
CHLORAPREP W/TINT 26 (MISCELLANEOUS) ×2 IMPLANT
CLIP APPLIE 5 13 M/L LIGAMAX5 (MISCELLANEOUS) ×2 IMPLANT
COVER MAYO STAND XLG (MISCELLANEOUS) ×2 IMPLANT
COVER SURGICAL LIGHT HANDLE (MISCELLANEOUS) ×4 IMPLANT
DERMABOND ADVANCED .7 DNX12 (GAUZE/BANDAGES/DRESSINGS) ×2 IMPLANT
DEVICE TROCAR PUNCTURE CLOSURE (ENDOMECHANICALS) IMPLANT
DRAPE C-ARM 42X120 X-RAY (DRAPES) IMPLANT
DRAPE LAPAROSCOPIC ABDOMINAL (DRAPES) ×2 IMPLANT
ELECT REM PT RETURN 15FT ADLT (MISCELLANEOUS) ×2 IMPLANT
GLOVE BIO SURGEON STRL SZ 6.5 (GLOVE) ×2 IMPLANT
GLOVE BIOGEL PI IND STRL 7.0 (GLOVE) ×2 IMPLANT
GLOVE INDICATOR 6.5 STRL GRN (GLOVE) ×2 IMPLANT
GOWN STRL REUS W/ TWL XL LVL3 (GOWN DISPOSABLE) ×6 IMPLANT
GOWN STRL REUS W/TWL XL LVL3 (GOWN DISPOSABLE) ×3
IRRIG SUCT STRYKERFLOW 2 WTIP (MISCELLANEOUS) ×1
IRRIGATION SUCT STRKRFLW 2 WTP (MISCELLANEOUS) ×2 IMPLANT
IV CATH 14GX2 1/4 (CATHETERS) IMPLANT
KIT BASIN OR (CUSTOM PROCEDURE TRAY) ×2 IMPLANT
KIT TURNOVER KIT A (KITS) IMPLANT
PENCIL SMOKE EVACUATOR (MISCELLANEOUS) IMPLANT
SCISSORS LAP 5X35 DISP (ENDOMECHANICALS) ×2 IMPLANT
SET CHOLANGIOGRAPH MIX (MISCELLANEOUS) IMPLANT
SET TUBE SMOKE EVAC HIGH FLOW (TUBING) ×2 IMPLANT
SLEEVE ADV FIXATION 5X100MM (TROCAR) ×4 IMPLANT
SUT VIC AB 2-0 SH 27 (SUTURE) ×1
SUT VIC AB 2-0 SH 27X BRD (SUTURE) IMPLANT
SUT VIC AB 4-0 PS2 18 (SUTURE) ×2 IMPLANT
SUT VICRYL 0 UR6 27IN ABS (SUTURE) IMPLANT
SYS BAG RETRIEVAL 10MM (BASKET) ×1
SYSTEM BAG RETRIEVAL 10MM (BASKET) ×2 IMPLANT
TOWEL OR 17X26 10 PK STRL BLUE (TOWEL DISPOSABLE) ×2 IMPLANT
TOWEL OR NON WOVEN STRL DISP B (DISPOSABLE) ×2 IMPLANT
TRAY LAPAROSCOPIC (CUSTOM PROCEDURE TRAY) ×2 IMPLANT
TROCAR ADV FIXATION 5X100MM (TROCAR) ×2 IMPLANT
TROCAR BALLN 12MMX100 BLUNT (TROCAR) IMPLANT
TUBE TRACH FLEX 6.0 UNCF (MISCELLANEOUS) IMPLANT

## 2022-07-05 NOTE — Progress Notes (Signed)
Pt is off the unit at this time.  RT to check trach at the next scheduled time.

## 2022-07-05 NOTE — Anesthesia Preprocedure Evaluation (Signed)
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 on medications Normal cardiovascular exam Rhythm:Regular Rate:Normal     Neuro/Psych negative neurological ROS  negative psych ROS   GI/Hepatic negative GI ROS,,,ACUTE CHOLECYSTITIS   Endo/Other    Renal/GU negative Renal ROS     Musculoskeletal negative musculoskeletal ROS (+)    Abdominal   Peds  Hematology negative hematology ROS (+)   Anesthesia Other Findings Day of surgery medications reviewed with the patient.  Reproductive/Obstetrics                              Anesthesia Physical Anesthesia Plan  ASA: 4  Anesthesia Plan: General   Post-op Pain Management: Tylenol PO (pre-op)*   Induction: Intravenous  PONV Risk Score and Plan: 3 and Dexamethasone, Ondansetron and Midazolam  Airway Management Planned: Tracheostomy  Additional Equipment:   Intra-op Plan:   Post-operative Plan: Extubation in OR  Informed Consent: I have reviewed the patients History and Physical, chart, labs and discussed the procedure including the risks, benefits and alternatives for the proposed anesthesia with the patient or authorized representative who has indicated his/her understanding and acceptance.     Dental advisory given  Plan Discussed with: CRNA  Anesthesia Plan Comments:          Anesthesia Quick Evaluation

## 2022-07-05 NOTE — Anesthesia Procedure Notes (Signed)
Procedure Name: Intubation Date/Time: 07/05/2022 12:06 PM  Performed by: [place] Manus Rudd, CRNAPre-anesthesia Checklist: Patient identified, Emergency Drugs available, Suction available and Patient being monitored Patient Re-evaluated:Patient Re-evaluated prior to induction Oxygen Delivery Method: Circle system utilized Preoxygenation: Pre-oxygenation with 100% oxygen Induction Type: IV induction Tube type: Reinforced Tube size: 5.5 mm Number of attempts: 1 Placement Confirmation: positive ETCO2 and breath sounds checked- equal and bilateral Secured at: 16 cm Comments: Trach removed and ETT placed via stoma and cuff inflated.

## 2022-07-05 NOTE — Discharge Instructions (Signed)
[facility] LAPAROSCOPIC SURGERY: POST OP INSTRUCTIONS Always review your discharge instruction sheet given to you by the facility where your surgery was performed. IF YOU HAVE DISABILITY OR FAMILY LEAVE FORMS, YOU MUST BRING THEM TO THE OFFICE FOR PROCESSING.   DO NOT GIVE THEM TO YOUR DOCTOR.  PAIN CONTROL  First take acetaminophen (Tylenol) AND/or ibuprofen (Advil) to control your pain after surgery.  Follow directions on package.  Taking acetaminophen (Tylenol) and/or ibuprofen (Advil) regularly after surgery will help to control your pain and lower the amount of prescription pain medication you may need.  You should not take more than 3,000 mg (3 grams) of acetaminophen (Tylenol) in 24 hours.  You should not take ibuprofen (Advil), aleve, motrin, naprosyn or other NSAIDS if you have a history of stomach ulcers or chronic kidney disease.  A prescription for pain medication may be given to you upon discharge.  Take your pain medication as prescribed, if you still have uncontrolled pain after taking acetaminophen (Tylenol) or ibuprofen (Advil). Use ice packs to help control pain. If you need a refill on your pain medication, please contact your pharmacy.  They will contact our office to request authorization. Prescriptions will not be filled after 5pm or on week-ends.  HOME MEDICATIONS Take your usually prescribed medications unless otherwise directed.  DIET You should follow a light diet the first few days after arrival home.  Be sure to include lots of fluids daily. Avoid fatty, fried foods.   CONSTIPATION It is common to experience some constipation after surgery and if you are taking pain medication.  Increasing fluid intake and taking a stool softener (such as Colace) will usually help or prevent this problem from occurring.  A mild laxative (Milk of Magnesia or Miralax) should be taken according to package instructions if there are no bowel movements after 48  hours.  WOUND/INCISION CARE Most patients will experience some swelling and bruising in the area of the incisions.  Ice packs will help.  Swelling and bruising can take several days to resolve.  Unless discharge instructions indicate otherwise, follow guidelines below  STERI-STRIPS - you may remove your outer bandages 48 hours after surgery, and you may shower at that time.  You have steri-strips (small skin tapes) in place directly over the incision.  These strips should be left on the skin for 7-10 days.   DERMABOND/SKIN GLUE - you may shower in 24 hours.  The glue will flake off over the next 2-3 weeks. Any sutures or staples will be removed at the office during your follow-up visit.  ACTIVITIES You may resume regular (light) daily activities beginning the next day--such as daily self-care, walking, climbing stairs--gradually increasing activities as tolerated.  You may have sexual intercourse when it is comfortable.  Refrain from any heavy lifting or straining until approved by your doctor. You may drive when you are no longer taking prescription pain medication, you can comfortably wear a seatbelt, and you can safely maneuver your car and apply brakes.  FOLLOW-UP You should see your doctor in the office for a follow-up appointment approximately 2-3 weeks after your surgery.  You should have been given your post-op/follow-up appointment when your surgery was scheduled.  If you did not receive a post-op/follow-up appointment, make sure that you call for this appointment within a day or two after you arrive home to insure a convenient appointment time.   WHEN TO CALL YOUR DOCTOR: Fever over 101.0 Inability to urinate Continued bleeding from incision.   Increased pain, redness, or drainage from the incision. Increasing abdominal pain  The clinic staff is available to answer your questions during regular business hours.  Please don't hesitate to call and ask to speak to one of the nurses for  clinical concerns.  If you have a medical emergency, go to the nearest emergency room or call 911.  A surgeon from [facility] is always on call at the hospital. [address], [address], [place], [place]  [address] ? [address] [address], [place], [place]   [address] [phone] ? [phone] ? FAX [phone] Web site: [email or web address]  

## 2022-07-05 NOTE — Transfer of Care (Signed)
Immediate Anesthesia Transfer of Care Note  Patient: Catherine Erickson  Procedure(s) Performed: LAPAROSCOPIC CHOLECYSTECTOMY (Abdomen)  Patient Location: PACU  Anesthesia Type:General  Level of Consciousness: awake, alert , and oriented  Airway & Oxygen Therapy: Patient Spontanous Breathing and Patient connected to face mask oxygen  Post-op Assessment: Report given to RN and Post -op Vital signs reviewed and stable  Post vital signs: Reviewed and stable  Last Vitals:  Vitals Value Taken Time  BP 159/68 07/05/22 1331  Temp    Pulse 97 07/05/22 1333  Resp 16 07/05/22 1333  SpO2 93 % 07/05/22 1333  Vitals shown include unvalidated device data.  Last Pain:  Vitals:   07/05/22 1024  TempSrc:   PainSc: 0-No pain      Patients Stated Pain Goal: 0 ([id number])  Complications: No notable events documented.

## 2022-07-05 NOTE — Plan of Care (Signed)
  Problem: Safety: Goal: Ability to remain free from injury will improve Outcome: Progressing   Problem: Pain Managment: Goal: General experience of comfort will improve Outcome: Progressing   Problem: Coping: Goal: Level of anxiety will decrease Outcome: Progressing   Problem: Education: Goal: Knowledge of General Education information will improve Description Including pain rating scale, medication(s)/side effects and non-pharmacologic comfort measures Outcome: Progressing   

## 2022-07-05 NOTE — Op Note (Signed)
06/29/2022 - 07/05/2022  1:05 PM  PATIENT:  Catherine Erickson  67 y.o. female  Patient Care Team: Pcp, No as PCP - General  PRE-OPERATIVE DIAGNOSIS:  ACUTE CHOLECYSTITIS  POST-OPERATIVE DIAGNOSIS:  CHRONIC CHOLECYSTITIS  PROCEDURE:  LAPAROSCOPIC CHOLECYSTECTOMY   Surgeon(s): Leighton Ruff, MD  ASSISTANT: Barkley Boards, PA   ANESTHESIA:   local and general  EBL:  Total I/O In: 100 [IV Piggyback:100] Out: 60 [Urine:200]  DRAINS: none   SPECIMEN:  Source of Specimen:  gallbladder  DISPOSITION OF SPECIMEN:  PATHOLOGY  COUNTS:  YES  PLAN OF CARE:  Patient already admitted  PATIENT DISPOSITION:  PACU - hemodynamically stable.  INDICATION:   The anatomy & physiology of hepatobiliary & pancreatic function was discussed.  The pathophysiology of gallbladder dysfunction was discussed.  Natural history risks without surgery was discussed.   I feel the risks of no intervention will lead to serious problems that outweigh the operative risks; therefore, I recommended cholecystectomy to remove the pathology.  I explained laparoscopic techniques with possible need for an open approach.  Probable cholangiogram to evaluate the bilary tract was explained as well.    Risks such as bleeding, infection, abscess, leak, injury to other organs, need for further treatment, heart attack, death, and other risks were discussed.  I noted a good likelihood this will help address the problem.  Possibility that this will not correct all abdominal symptoms was explained.  Goals of post-operative recovery were discussed as well.    OR FINDINGS: chronic cholecystitis   DESCRIPTION:   The patient was identified & brought into the operating room. The patient was positioned supine with arms tucked. SCDs were active during the entire case. The patient underwent general anesthesia without any difficulty.  The abdomen was prepped and draped in a sterile fashion. A Surgical Timeout was performed and confirmed our  plan.  We positioned the patient in reverse [place] & left side up.  I entered the abdomen via a Varies needle in the LUQ.  I insufflated the abdomen to ~34mm Hg.  I placed a 86mm port in the midline superior to the umbilicus and introduced the camera.  Entry was clean. There was no sign of injury at either site.  I placed a Hassan laparoscopic port through the umbilicus using open entry technique.  There were no adhesions to the anterior abdominal wall supraumbilically.   I proceeded to continue with laparoscopic technique. I placed a 5 mm port in mid subcostal region, another 59mm port in the right flank near the anterior axillary line, and a 48mm port in the left subxiphoid region obliquely within the falciform ligament.  I turned attention to the right upper quadrant.  The omentum was retracted down.  The gallbladder fundus was elevated cephalad. I used cautery and blunt dissection to free the peritoneal coverings between the gallbladder and the liver on the posteriolateral and anteriomedial walls.   I used careful blunt and cautery dissection with a blunt dissector to help get a good critical view of the cystic artery and cystic duct. I did further dissection to free a few centimeters of the  gallbladder off the liver bed to get a good critical view of the infundibulum and cystic duct. I mobilized the cystic artery.  I skeletonized the cystic duct.  After getting a good 360 view, I decided not to perform a cholangiogram.  I placed a clip on the infundibulum.  I placed clips on the cystic duct x2.  I completed cystic duct transection.  I placed clips on the cystic artery x3 with 2 proximally.  I ligated the cystic artery using scissors. I freed the gallbladder from its remaining attachments to the liver. I ensured hemostasis on the gallbladder fossa of the liver and elsewhere. I inspected the rest of the abdomen & detected no injury nor bleeding elsewhere.  I irrigated the RUQ with normal saline.  I  removed the gallbladder through the upper 12 mm port site.  I closed the fascia using 0 Vicryl stitches x2 using a laparoscopic suture passer.   I closed the skin using 4-0 vicryl stitches.  Sterile dressings were applied. The patient was extubated & arrived in the PACU in stable condition.  I had discussed postoperative care with the patient in the holding area.   I will discuss  operative findings and postoperative goals / instructions with the patient's family.  Instructions are written in the chart as well.   Rosario Adie, MD  [facility]

## 2022-07-05 NOTE — Progress Notes (Signed)
Progress Note  Day of Surgery  Subjective: Pt denies abdominal pain, n/v. Awaiting lap chole this AM, this plan was discussed over the weekend. Patient denies further questions or concerns  Objective: Vital signs in last 24 hours: Temp:  [97.9 F (36.6 C)-98.1 F (36.7 C)] 97.9 F (36.6 C) (03/18 0458) Pulse Rate:  [81-94] 81 (03/18 0458) Resp:  [18-20] 19 (03/18 0458) BP: (140-151)/(64-84) 140/79 (03/18 0458) SpO2:  [93 %-100 %] 96 % (03/18 0750) FiO2 (%):  [21 %] 21 % (03/18 0750) Weight:  [103 kg] 103 kg (03/18 0600) Last BM Date : 07/03/22  Intake/Output from previous day: 03/17 0701 - 03/18 0700 In: 1282.7 [P.O.:600; IV Piggyback:682.7] Out: [id number] [Urine:1650] Intake/Output this shift: No intake/output data recorded.  PE: General: pleasant, WD, female who is sitting up in bed in NAD HEENT: Sclera are anicteric Heart: regular, rate, and rhythm.  Lungs: tracheostomy in place, HTC Abd: soft, ND, no masses, hernias, or organomegaly. No TTP Psych: A&Ox3 with an appropriate affect.   Lab Results:  Recent Labs    07/04/22 0413 07/05/22 0430  WBC 13.2* 17.1*  HGB 12.3 12.5  HCT 35.4* 37.4  PLT 294 318   BMET Recent Labs    07/04/22 0413 07/05/22 0430  NA 138 138  K 3.4* 3.6  CL 105 107  CO2 23 22  GLUCOSE 93 105*  BUN 14 16  CREATININE 1.07* 1.10*  CALCIUM 9.0 9.3   PT/INR No results for input(s): "LABPROT", "INR" in the last 72 hours. CMP     Component Value Date/Time   NA 138 07/05/2022 0430   K 3.6 07/05/2022 0430   CL 107 07/05/2022 0430   CO2 22 07/05/2022 0430   GLUCOSE 105 (H) 07/05/2022 0430   BUN 16 07/05/2022 0430   CREATININE 1.10 (H) 07/05/2022 0430   CALCIUM 9.3 07/05/2022 0430   PROT 7.2 07/05/2022 0430   ALBUMIN 3.1 (L) 07/05/2022 0430   AST 53 (H) 07/05/2022 0430   ALT 69 (H) 07/05/2022 0430   ALKPHOS 221 (H) 07/05/2022 0430   BILITOT 1.6 (H) 07/05/2022 0430   GFRNONAA 56 (L) 07/05/2022 0430   GFRAA 44 (L) 12/05/2016 0947    Lipase     Component Value Date/Time   LIPASE 23 07/01/2022 0830       Studies/Results: No results found.  Anti-infectives: Anti-infectives (From admission, onward)    Start     Dose/Rate Route Frequency Ordered Stop   07/02/22 1600  ceFEPIme (MAXIPIME) 2 g in sodium chloride 0.9 % 100 mL IVPB        2 g 200 mL/hr over 30 Minutes Intravenous Every 8 hours 07/02/22 1432     07/01/22 1600  vancomycin (VANCOREADY) IVPB 1250 mg/250 mL  Status:  Discontinued        1,250 mg 166.7 mL/hr over 90 Minutes Intravenous Every 36 hours 06/30/22 0240 06/30/22 1547   06/30/22 2200  cefTRIAXone (ROCEPHIN) 2 g in sodium chloride 0.9 % 100 mL IVPB  Status:  Discontinued        2 g 200 mL/hr over 30 Minutes Intravenous Every 24 hours 06/30/22 1547 07/02/22 1432   06/30/22 2200  metroNIDAZOLE (FLAGYL) IVPB 500 mg        500 mg 100 mL/hr over 60 Minutes Intravenous Every 12 hours 06/30/22 1547     06/30/22 0800  piperacillin-tazobactam (ZOSYN) IVPB 3.375 g  Status:  Discontinued        3.375 g 12.5 mL/hr over 240 Minutes  Intravenous Every 8 hours 06/30/22 0240 06/30/22 1547   06/30/22 0100  piperacillin-tazobactam (ZOSYN) IVPB 3.375 g        3.375 g 100 mL/hr over 30 Minutes Intravenous  Once 06/30/22 0046 06/30/22 0226   06/30/22 0100  vancomycin (VANCOREADY) IVPB 2000 mg/400 mL        2,000 mg 200 mL/hr over 120 Minutes Intravenous  Once 06/30/22 0046 06/30/22 0401        Assessment/Plan Acute Cholecystitis  - RUQ [place] with cholelithiasis and no evidence of cholecystitis  - CT with cholelithiasis  - HIDA with possible hepatitis and no gallbladder activity suggestive of possible cholecystitis  - hepatitis panel negative and LFTs trending down - abdominal pain improving - OR today for laparoscopic cholecystectomy - I or a team member have explained the procedure, risks, and aftercare of Laparoscopic cholecystectomy.  Risks include but are not limited to anesthesia (MI, CVA, death,  prolonged intubation and aspiration), bleeding, infection, wound problems, hernia, bile leak, injury to common bile duct/liver/intestine, possible need for subtotal cholecystectomy or open cholecystectomy, increased risk of DVT/PE and diarrhea post op.  She seems to understand and agrees to proceed.    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   Norm Parcel, [facility] 07/05/2022, 8:07 AM Please see Amion for pager number during day hours 7:00am-4:30pm

## 2022-07-06 ENCOUNTER — Encounter (HOSPITAL_COMMUNITY): Payer: Self-pay | Admitting: General Surgery

## 2022-07-06 DIAGNOSIS — A419 Sepsis, unspecified organism: Secondary | ICD-10-CM | POA: Diagnosis not present

## 2022-07-06 DIAGNOSIS — R6521 Severe sepsis with septic shock: Secondary | ICD-10-CM | POA: Diagnosis not present

## 2022-07-06 LAB — CBC
HCT: 35.9 % — ABNORMAL LOW (ref 36.0–46.0)
Hemoglobin: 11.9 g/dL — ABNORMAL LOW (ref 12.0–15.0)
MCH: 27.7 pg (ref 26.0–34.0)
MCHC: 33.1 g/dL (ref 30.0–36.0)
MCV: 83.5 fL (ref 80.0–100.0)
Platelets: 305 10*3/uL (ref 150–400)
RBC: 4.3 MIL/uL (ref 3.87–5.11)
RDW: 16.3 % — ABNORMAL HIGH (ref 11.5–15.5)
WBC: 20 10*3/uL — ABNORMAL HIGH (ref 4.0–10.5)
nRBC: 0 % (ref 0.0–0.2)

## 2022-07-06 LAB — COMPREHENSIVE METABOLIC PANEL
ALT: 54 U/L — ABNORMAL HIGH (ref 0–44)
AST: 40 U/L (ref 15–41)
Albumin: 3 g/dL — ABNORMAL LOW (ref 3.5–5.0)
Alkaline Phosphatase: 204 U/L — ABNORMAL HIGH (ref 38–126)
Anion gap: 10 (ref 5–15)
BUN: 12 mg/dL (ref 8–23)
CO2: 23 mmol/L (ref 22–32)
Calcium: 8.9 mg/dL (ref 8.9–10.3)
Chloride: 106 mmol/L (ref 98–111)
Creatinine, Ser: 0.9 mg/dL (ref 0.44–1.00)
GFR, Estimated: >60 mL/min (ref >60)
Glucose, Bld: 105 mg/dL — ABNORMAL HIGH (ref 70–99)
Potassium: 3.5 mmol/L (ref 3.5–5.1)
Sodium: 139 mmol/L (ref 135–145)
Total Bilirubin: 1.3 mg/dL — ABNORMAL HIGH (ref 0.3–1.2)
Total Protein: 7.1 g/dL (ref 6.5–8.1)

## 2022-07-06 LAB — MAGNESIUM: Magnesium: 1.9 mg/dL (ref 1.7–2.4)

## 2022-07-06 LAB — SURGICAL PATHOLOGY

## 2022-07-06 MED ORDER — HYDROCODONE-ACETAMINOPHEN 5-325 MG PO TABS
1.0000 | ORAL_TABLET | ORAL | Status: DC | PRN
  Administered 2022-07-06: 1 via ORAL
  Filled 2022-07-06: qty 1

## 2022-07-06 MED ORDER — LISINOPRIL 20 MG PO TABS: 20.0000 mg | ORAL_TABLET | Freq: Every day | ORAL | 0 refills | Status: AC

## 2022-07-06 MED ORDER — ONDANSETRON 4 MG PO TBDP: 4.0000 mg | ORAL_TABLET | Freq: Three times a day (TID) | ORAL | 0 refills | Status: AC | PRN

## 2022-07-06 MED ORDER — HYDROCODONE-ACETAMINOPHEN 5-325 MG PO TABS: 1.0000 | ORAL_TABLET | Freq: Four times a day (QID) | ORAL | 0 refills | Status: AC | PRN

## 2022-07-06 MED ORDER — SENNOSIDES-DOCUSATE SODIUM 8.6-50 MG PO TABS: 1.0000 | ORAL_TABLET | Freq: Two times a day (BID) | ORAL | 0 refills | Status: AC | PRN

## 2022-07-06 MED ORDER — CIPROFLOXACIN HCL 500 MG PO TABS: 500.0000 mg | ORAL_TABLET | Freq: Two times a day (BID) | ORAL | 0 refills | Status: AC

## 2022-07-06 MED ORDER — HYDROMORPHONE HCL 1 MG/ML IJ SOLN
0.5000 mg | INTRAMUSCULAR | Status: DC | PRN
  Filled 2022-07-06: qty 0.5

## 2022-07-06 NOTE — Progress Notes (Signed)
PROGRESS NOTE    Catherine Erickson  [id number] DOB: 1955-12-20 DOA: 06/29/2022 PCP: Pcp, No   Brief Narrative:  67 year old with history of trach dependence, chronic pain, HTN, HLD admitted to the hospital for SIRS.  Patient able to communicate effectively but had difficulty speaking.  She started experiencing epigastric abdominal pain day prior to admission.  Upon admission patient was started on broad-spectrum antibiotics.  Patient was found to have Klebsiella bacteremia.  Transaminitis is improving but the right upper quadrant abdominal pain ultrasound ordered which showed choledocholithiasis without evidence of cholecystitis.  HIDA scan which is consistent with likely acute cholecystitis in the setting of choledocholithiasis.  GI and general surgery consulted, plans for laparoscopic cholecystectomy. OR today.  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  CT showing possible pyelonephritis but UA is clean.  Elevated BNP and procalcitonin.  Blood cultures are positive for Klebsiella which I suspect can be urinary or right upper quadrant source and setting of cholelithiasis/acute cholecystitis. - For now we will plan on continuing IV cefepime and Flagyl  Transaminitis, improving Acute cystitis with cholelithiasis -LFTs are improving, right upper quadrant and HIDA scan both are abnormal.  Acute hepatitis panel and lipase levels are negative.  GI and general surgery consulted, planning for laparoscopic cholecystectomy today  Hypokalemia - Aggressive repletion  CKD stage IIIb - Creatinine around baseline of 1.4.  Essential  hypertension - Norvasc.  Resume lisinopril 20 mg daily, holding HCTZ. IV as needed  Tracheostomy dependence ([place]) -Routine care  Hyperlipidemia - Hold statin while LFTs are elevated  DVT prophylaxis: SQ Heparin Code Status: Full Family Communication:    Status is: Inpatient Continue hospital stay due to multiple ongoing issues.OR today  Subjective: OR today, no complaints.   Examination: Constitutional: Not in acute distress, very pleasant Respiratory: Clear to auscultation bilaterally Cardiovascular: Normal sinus rhythm, no rubs Abdomen: Nontender nondistended good bowel sounds Musculoskeletal: No edema noted Skin: No rashes seen Neurologic: CN 2-12 grossly intact.  And nonfocal Psychiatric: Normal judgment and insight. Alert and oriented x 3. Normal mood. Chronic trach in place  Objective: Vitals:   07/06/22 0322 07/06/22 0322 07/06/22 0622 07/06/22 0836  BP:  (!) 154/70 (!) 147/65   Pulse: 82 81 80   Resp: 18 20    Temp:  98.1 F (36.7 C) 98.2 F (36.8 C)   TempSrc:  Oral Oral   SpO2: 95% 94%  95%  Weight:      Height:        Intake/Output Summary (Last 24 hours) at 07/06/2022 0840 Last data filed at 07/06/2022 0556 Gross per 24 hour  Intake 1600 ml  Output 2020 ml  Net -420 ml   Filed Weights   07/03/22 0500 07/04/22 0500 07/05/22 0600  Weight: 107.4 kg 104.3 kg 103 kg     Data Reviewed:   CBC: Recent Labs  Lab 06/29/22 2242 06/30/22 0427 07/01/22 0323 07/02/22 0331 07/03/22 0445 07/04/22 0413 07/05/22 0430 07/06/22 0436  WBC 9.9 31.5*   < > 13.5* 11.1* 13.2* 17.1* 20.0*  NEUTROABS 9.3* 28.7*  --   --   --   --   --   --  HGB 14.0 11.4*   < > 11.4* 12.1 12.3 12.5 11.9*  HCT 40.4 34.3*   < > 33.7* 35.7* 35.4* 37.4 35.9*  MCV 80.6 82.3   < > 82.6 81.0 79.9* 81.8 83.5  PLT 323 257   < > 265 298 294 318 305   < > = values in this interval not displayed.   Basic Metabolic Panel: Recent Labs  Lab 07/02/22 0331 07/03/22 0445  07/04/22 0413 07/05/22 0430 07/06/22 0436  NA 136 137 138 138 139  K 3.3* 3.5 3.4* 3.6 3.5  CL 103 103 105 107 106  CO2 25 25 23 22 23   GLUCOSE 128* 120* 93 105* 105*  BUN 10 10 14 16 12   CREATININE 1.10* 1.07* 1.07* 1.10* 0.90  CALCIUM 8.5* 9.4 9.0 9.3 8.9  MG 1.9 2.0 2.1 2.1 1.9   GFR: Estimated Creatinine Clearance: 73.8 mL/min (by C-G formula based on SCr of 0.9 mg/dL). Liver Function Tests: Recent Labs  Lab 07/02/22 0331 07/03/22 0445 07/04/22 0413 07/05/22 0430 07/06/22 0436  AST 83* 71* 54* 53* 40  ALT 121* 103* 82* 69* 54*  ALKPHOS 170* 181* 197* 221* 204*  BILITOT 3.1* 2.5* 2.1* 1.6* 1.3*  PROT 6.8 7.0 6.9 7.2 7.1  ALBUMIN 2.8* 2.9* 2.9* 3.1* 3.0*   Recent Labs  Lab 07/01/22 0830  LIPASE 23   No results for input(s): "AMMONIA" in the last 168 hours. Coagulation Profile: Recent Labs  Lab 06/29/22 2242  INR 1.1   Cardiac Enzymes: No results for input(s): "CKTOTAL", "CKMB", "CKMBINDEX", "TROPONINI" in the last 168 hours. BNP (last 3 results) No results for input(s): "PROBNP" in the last 8760 hours. HbA1C: No results for input(s): "HGBA1C" in the last 72 hours. CBG: No results for input(s): "GLUCAP" in the last 168 hours. Lipid Profile: No results for input(s): "CHOL", "HDL", "LDLCALC", "TRIG", "CHOLHDL", "LDLDIRECT" in the last 72 hours. Thyroid Function Tests: No results for input(s): "TSH", "T4TOTAL", "FREET4", "T3FREE", "THYROIDAB" in the last 72 hours. Anemia Panel: No results for input(s): "VITAMINB12", "FOLATE", "FERRITIN", "TIBC", "IRON", "RETICCTPCT" in the last 72 hours. Sepsis Labs: Recent Labs  Lab 06/30/22 0100 06/30/22 0851 06/30/22 1240 06/30/22 1541 07/01/22 0830  PROCALCITON  --   --   --   --  23.68  LATICACIDVEN 4.0* 3.6* 1.9 1.4  --     Recent Results (from the past 240 hour(s))  Culture, blood (Routine x 2)     Status: Abnormal   Collection Time: 06/29/22 10:55 PM   Specimen: BLOOD LEFT WRIST  Result Value Ref Range  Status   Specimen Description   Final    BLOOD LEFT WRIST Performed at [facility] [address], [place], [place] [address]    Special Requests   Final    BOTTLES DRAWN AEROBIC AND ANAEROBIC Blood Culture adequate volume Performed at [facility] [address] [place] [address], [place], [place] [address]    Culture  Setup Time   Final    GRAM NEGATIVE RODS IN BOTH AEROBIC AND ANAEROBIC BOTTLES CRITICAL RESULT CALLED TO, READ BACK BY AND VERIFIED WITH: PHARMD MICHELLE LILLISTON ON 06/30/22 @ 2321 BY DRT Performed at [facility], [place] [address] [place],  [address]    Culture ENTEROBACTER SPECIES (A)  Final   Report Status 07/04/2022 FINAL  Final   Organism ID, Bacteria ENTEROBACTER SPECIES  Final      Susceptibility   Enterobacter species - MIC*    CEFEPIME <=0.12 SENSITIVE Sensitive  CEFTAZIDIME <=1 SENSITIVE Sensitive     CEFTRIAXONE <=0.25 SENSITIVE Sensitive     CIPROFLOXACIN <=0.25 SENSITIVE Sensitive     GENTAMICIN <=1 SENSITIVE Sensitive     IMIPENEM 1 SENSITIVE Sensitive     TRIMETH/SULFA <=20 SENSITIVE Sensitive     PIP/TAZO <=4 SENSITIVE Sensitive     * ENTEROBACTER SPECIES  Culture, blood (Routine x 2)     Status: Abnormal   Collection Time: 06/29/22 10:55 PM   Specimen: BLOOD  Result Value Ref Range Status   Specimen Description   Final    BLOOD LEFT ANTECUBITAL Performed at [facility], [place] [address], [place], [place] [address]    Special Requests   Final    BOTTLES DRAWN AEROBIC AND ANAEROBIC Blood Culture results may not be optimal due to an inadequate volume of blood received in culture bottles Performed at [facility] [address], [place], [place] [address]    Culture  Setup Time   Final    GRAM NEGATIVE RODS IN BOTH AEROBIC AND ANAEROBIC BOTTLES CRITICAL RESULT CALLED TO, READ BACK BY AND VERIFIED WITH: [place] GC:[id number] BY JRS Performed at [facility], [place] [address] [place], [place] [address]    Culture KLEBSIELLA PNEUMONIAE (A)  Final   Report Status 07/04/2022 FINAL  Final   Organism ID, Bacteria KLEBSIELLA PNEUMONIAE  Final      Susceptibility   Klebsiella pneumoniae - MIC*    AMPICILLIN RESISTANT Resistant     CEFEPIME <=0.12 SENSITIVE Sensitive     CEFTAZIDIME <=1 SENSITIVE Sensitive     CEFTRIAXONE <=0.25 SENSITIVE Sensitive     CIPROFLOXACIN <=0.25 SENSITIVE Sensitive     GENTAMICIN <=1 SENSITIVE Sensitive     IMIPENEM <=0.25 SENSITIVE Sensitive     TRIMETH/SULFA <=20 SENSITIVE Sensitive     AMPICILLIN/SULBACTAM <=2 SENSITIVE Sensitive     PIP/TAZO <=4 SENSITIVE Sensitive     * KLEBSIELLA PNEUMONIAE  Blood Culture ID Panel (Reflexed)     Status: Abnormal   Collection Time: 06/29/22 10:55 PM  Result Value Ref Range Status   Enterococcus faecalis NOT DETECTED NOT DETECTED Final   Enterococcus Faecium NOT DETECTED NOT DETECTED Final   Listeria monocytogenes NOT DETECTED NOT DETECTED Final   Staphylococcus species NOT DETECTED NOT DETECTED Final   Staphylococcus aureus (BCID) NOT DETECTED NOT DETECTED Final   Staphylococcus epidermidis NOT DETECTED NOT DETECTED Final   Staphylococcus lugdunensis NOT DETECTED NOT DETECTED Final   Streptococcus species NOT DETECTED NOT DETECTED Final   Streptococcus agalactiae NOT DETECTED NOT DETECTED Final   Streptococcus pneumoniae NOT DETECTED NOT DETECTED Final   Streptococcus pyogenes NOT DETECTED NOT DETECTED Final   A.calcoaceticus-baumannii NOT DETECTED NOT DETECTED Final   Bacteroides fragilis NOT DETECTED NOT DETECTED Final   Enterobacterales DETECTED (A) NOT DETECTED Final    Comment: Enterobacterales represent a large order of gram negative bacteria, not a single organism. CRITICAL RESULT CALLED TO, READ BACK BY AND VERIFIED WITH: PHARMD [place] GC:[id number] BY JRS    Enterobacter cloacae complex NOT DETECTED NOT DETECTED Final   Escherichia coli NOT DETECTED NOT  DETECTED Final   Klebsiella aerogenes NOT DETECTED NOT DETECTED Final   Klebsiella oxytoca NOT DETECTED NOT DETECTED Final   Klebsiella pneumoniae DETECTED (A) NOT DETECTED Final    Comment: CRITICAL RESULT CALLED TO, READ BACK BY AND VERIFIED WITH: PHARMD CHRISTINE SHADE GC:[id number] BY JRS.    Proteus species NOT DETECTED NOT DETECTED Final  Salmonella species NOT DETECTED NOT DETECTED Final   Serratia marcescens NOT DETECTED NOT DETECTED Final   Haemophilus influenzae NOT DETECTED NOT DETECTED Final   Neisseria meningitidis NOT DETECTED NOT DETECTED Final   Pseudomonas aeruginosa NOT DETECTED NOT DETECTED Final   Stenotrophomonas maltophilia NOT DETECTED NOT DETECTED Final   Candida albicans NOT DETECTED NOT DETECTED Final   Candida auris NOT DETECTED NOT DETECTED Final   Candida glabrata NOT DETECTED NOT DETECTED Final   Candida krusei NOT DETECTED NOT DETECTED Final   Candida parapsilosis NOT DETECTED NOT DETECTED Final   Candida tropicalis NOT DETECTED NOT DETECTED Final   Cryptococcus neoformans/gattii NOT DETECTED NOT DETECTED Final   CTX-M ESBL NOT DETECTED NOT DETECTED Final   Carbapenem resistance IMP NOT DETECTED NOT DETECTED Final   Carbapenem resistance KPC NOT DETECTED NOT DETECTED Final   Carbapenem resistance NDM NOT DETECTED NOT DETECTED Final   Carbapenem resist OXA 48 LIKE NOT DETECTED NOT DETECTED Final   Carbapenem resistance VIM NOT DETECTED NOT DETECTED Final    Comment: Performed at [facility], [address] [place], [place] [address]  MRSA Next Gen by PCR, Nasal     Status: None   Collection Time: 06/30/22 12:26 PM   Specimen: Nasal Mucosa; Nasal Swab  Result Value Ref Range Status   MRSA by PCR Next Gen NOT DETECTED NOT DETECTED Final    Comment: (NOTE) The GeneXpert MRSA Assay (FDA approved for NASAL specimens only), is one component of a comprehensive MRSA colonization surveillance program. It is not intended to diagnose MRSA infection nor to  guide or monitor treatment for MRSA infections. Test performance is not FDA approved in patients less than 35 years old. Performed at [facility], [place] [address] [place],  [address]          Radiology Studies: No results found.      Scheduled Meds:  amLODipine  10 mg Oral Daily   Chlorhexidine Gluconate Cloth  6 each Topical Daily   heparin injection (subcutaneous)  5,000 Units Subcutaneous Q8H   ipratropium-albuterol  3 mL Nebulization BID   lisinopril  20 mg Oral Daily   morphine (PF)        morphine injection  3 mg Intravenous Once   mouth rinse  15 mL Mouth Rinse 4 times per day   Continuous Infusions:  ceFEPime (MAXIPIME) IV 2 g (07/06/22 [id number])   metronidazole Stopped (07/05/22 2233)     LOS: 6 days   Time spent= 35 mins    Dametria Tuzzolino Arsenio Loader, MD [facility]  If 7PM-7AM, please contact night-coverage  07/06/2022, 8:40 AM

## 2022-07-06 NOTE — Anesthesia Postprocedure Evaluation (Signed)
Anesthesia Post Note  Patient: Catherine Erickson  Procedure(s) Performed: LAPAROSCOPIC CHOLECYSTECTOMY (Abdomen)     Patient location during evaluation: PACU Anesthesia Type: General Level of consciousness: awake and alert Pain management: pain level controlled Vital Signs Assessment: post-procedure vital signs reviewed and stable Respiratory status: spontaneous breathing, nonlabored ventilation, respiratory function stable and patient connected to tracheostomy mask oxygen Cardiovascular status: blood pressure returned to baseline and stable Postop Assessment: no apparent nausea or vomiting Anesthetic complications: no   No notable events documented.  Last Vitals:  Vitals:   07/06/22 1035 07/06/22 1131  BP: (!) 144/67   Pulse: 87 87  Resp: 18 18  Temp:    SpO2: 98% 92%    Last Pain:  Vitals:   07/06/22 0821  TempSrc:   PainSc: 8    Pain Goal: Patients Stated Pain Goal: 4 (07/06/22 0821)                 Santa Lighter

## 2022-07-06 NOTE — Progress Notes (Signed)
.   Transition of Care [facility]) Screening Note   Patient Details  Name: ILINE MOUSER Date of Birth: 04-Apr-1956   Transition of Care [facility]) CM/SW Contact:    Illene Regulus, LCSW Phone Number: 07/06/2022, 10:47 AM    Transition of Care Department [facility]) has reviewed patient and no TOC needs have been identified at this time. We will continue to monitor patient advancement through interdisciplinary progression rounds. If new patient transition needs arise, please place a TOC consult.

## 2022-07-06 NOTE — Discharge Summary (Signed)
Physician Discharge Summary  Catherine Erickson [id number] DOB: Sep 03, 1955 DOA: 06/29/2022  PCP: Pcp, No  Admit date: 06/29/2022 Discharge date: 07/06/2022  Admitted From: Home Disposition: Home  Recommendations for Outpatient Follow-up:  Follow up with PCP in 1-2 weeks Please obtain BMP/CBC in one week your next doctors visit.  Pain medication with bowel regimen prescribed Lisinopril 20 mg orally daily 3 more days of oral Cipro to complete total 10-day course  Discharge Condition: Stable CODE STATUS: Full code Diet recommendation: As tolerated  Brief/Interim Summary: 67 year old with history of trach dependence, chronic pain, HTN, HLD admitted to the hospital for SIRS.  Patient able to communicate effectively but had difficulty speaking.  She started experiencing epigastric abdominal pain day prior to admission.  Upon admission patient was started on broad-spectrum antibiotics.  Patient was found to have Klebsiella bacteremia.  Transaminitis is improving but the right upper quadrant abdominal pain ultrasound ordered which showed choledocholithiasis without evidence of cholecystitis.  HIDA scan which is consistent with likely acute cholecystitis in the setting of choledocholithiasis.  GI and general surgery consulted, plans for laparoscopic cholecystectomy.  Underwent laparoscopic cholecystectomy on 3/18, tolerated well.  Following day she was doing well and close cleared for discharge.   Assessment & Plan:  Principal Problem:   Septic shock ([place]) Active Problems:   Essential hypertension   Abdominal pain   Fever   Tachycardia   Leukocytosis   Hypokalemia   Hypomagnesemia   Tracheostomy dependence (HCC)   Lactic acidosis   LFT elevation   Septic shock (HCC) secondary to possible pneumonia, improving Klebsiella bacteremia, prelim cultures - Sepsis physiology has slowly improved.  CT showing possible pyelonephritis but UA is clean.  Elevated BNP and procalcitonin.  Blood cultures  are positive for Klebsiella which I suspect can be urinary or right upper quadrant source and setting of cholelithiasis/acute cholecystitis. - Will transition to oral Cipro for 3 more days to complete total 10-day course   Transaminitis, improving Acute cystitis with cholelithiasis -LFTs are improving, right upper quadrant and HIDA scan both are abnormal.  Acute hepatitis panel and lipase levels are negative.  GI and general surgery consulted, status post laparoscopic cholecystectomy 3/18.  Postop pain meds prescribed by general surgery and cleared for discharge.  Their service will arrange for outpatient follow-up.   Hypokalemia - Aggressive repletion   CKD stage IIIb - Creatinine around baseline of 1.4.   Essential hypertension - Norvasc.  Holding HCTZ.  Lisinopril 20 mg daily.   Tracheostomy dependence ([place]) -Routine care   Hyperlipidemia - Resume home meds    Discharge Diagnoses:  Principal Problem:   Septic shock ([place]) Active Problems:   Essential hypertension   Abdominal pain   Fever   Tachycardia   Leukocytosis   Hypokalemia   Hypomagnesemia   Tracheostomy dependence (HCC)   Lactic acidosis   LFT elevation      Consultations: Gastroenterology General surgery  Subjective: Feeling well.  Wants to go home. In relation oxygen saturation dropped to 88% but quickly recovered to 93-95%.  Denies any shortness of breath.  Wants to go  Discharge Exam: Vitals:   07/06/22 1035 07/06/22 1131  BP: (!) 144/67   Pulse: 87 87  Resp: 18 18  Temp:    SpO2: 98% 92%   Vitals:   07/06/22 0622 07/06/22 0836 07/06/22 1035 07/06/22 1131  BP: (!) 147/65  (!) 144/67   Pulse: 80 80 87 87  Resp:  16 18 18   Temp: 98.2 F (36.8 C)  TempSrc: Oral     SpO2:  95% 98% 92%  Weight:      Height:        General: Pt is alert, awake, not in acute distress Cardiovascular: RRR, S1/S2 +, no rubs, no gallops Respiratory: CTA bilaterally, no wheezing, no rhonchi Abdominal:  Soft, NT, ND, bowel sounds + Extremities: no edema, no cyanosis Chronic trach in place  Discharge Instructions   Allergies as of 07/06/2022   No Known Allergies      Medication List     STOP taking these medications    metroNIDAZOLE 500 MG tablet Commonly known as: FLAGYL       TAKE these medications    acetaminophen 500 MG tablet Commonly known as: TYLENOL Take 500 mg by mouth every 6 (six) hours as needed for moderate pain.   amLODipine 10 MG tablet Commonly known as: NORVASC Take 10 mg by mouth daily.   cetirizine 10 MG tablet Commonly known as: ZYRTEC Take 10 mg by mouth daily.   ciprofloxacin 500 MG tablet Commonly known as: Cipro Take 1 tablet (500 mg total) by mouth 2 (two) times daily for 3 days.   cyclobenzaprine 10 MG tablet Commonly known as: FLEXERIL Take 1 tablet (10 mg total) by mouth 2 (two) times daily as needed for muscle spasms.   hydrochlorothiazide 25 MG tablet Commonly known as: HYDRODIURIL Take 25 mg by mouth daily.   HYDROcodone-acetaminophen 5-325 MG tablet Commonly known as: NORCO/VICODIN Take 1-2 tablets by mouth every 6 (six) hours as needed for moderate pain or severe pain. What changed:  how much to take when to take this reasons to take this   lisinopril 20 MG tablet Commonly known as: ZESTRIL Take 1 tablet (20 mg total) by mouth daily. Start taking on: July 07, 2022   metFORMIN 500 MG tablet Commonly known as: GLUCOPHAGE Take 500 mg by mouth 2 (two) times daily with a meal.   naproxen 500 MG tablet Commonly known as: NAPROSYN Take 1 tablet (500 mg total) by mouth 2 (two) times daily.   ondansetron 4 MG disintegrating tablet Commonly known as: Zofran ODT Take 1 tablet (4 mg total) by mouth every 8 (eight) hours as needed.   rosuvastatin 10 MG tablet Commonly known as: CRESTOR Take 10 mg by mouth at bedtime.   senna-docusate 8.6-50 MG tablet Commonly known as: Senokot-S Take 1 tablet by mouth 2 (two) times  daily as needed for moderate constipation or mild constipation.   Soluble Fiber/Probiotics Chew Chew 1 capsule by mouth 2 (two) times daily.        Follow-up Information     [facility], Carlena Hurl, [place]. Go on 07/27/2022.   Specialty: General Surgery Why: 9:45 AM. Please arrive 30 min prior to appointment time to complete check in. Have ID and insurance information with you. Contact information: [place] [address] [phone]                No Known Allergies  You were cared for by a hospitalist during your hospital stay. If you have any questions about your discharge medications or the care you received while you were in the hospital after you are discharged, you can call the unit and asked to speak with the hospitalist on call if the hospitalist that took care of you is not available. Once you are discharged, your primary care physician will handle any further medical issues. Please note that no refills for any discharge  medications will be authorized once you are discharged, as it is imperative that you return to your primary care physician (or establish a relationship with a primary care physician if you do not have one) for your aftercare needs so that they can reassess your need for medications and monitor your lab values.  You were cared for by a hospitalist during your hospital stay. If you have any questions about your discharge medications or the care you received while you were in the hospital after you are discharged, you can call the unit and asked to speak with the hospitalist on call if the hospitalist that took care of you is not available. Once you are discharged, your primary care physician will handle any further medical issues. Please note that NO REFILLS for any discharge medications will be authorized once you are discharged, as it is imperative that you return to your primary care physician (or establish a  relationship with a primary care physician if you do not have one) for your aftercare needs so that they can reassess your need for medications and monitor your lab values.  Please request your Prim.MD to go over all Hospital Tests and Procedure/Radiological results at the follow up, please get all Hospital records sent to your Prim MD by signing hospital release before you go home.  Get CBC, CMP, 2 view Chest X ray checked  by Primary MD during your next visit or SNF MD in 5-7 days ( we routinely change or add medications that can affect your baseline labs and fluid status, therefore we recommend that you get the mentioned basic workup next visit with your PCP, your PCP may decide not to get them or add new tests based on their clinical decision)  On your next visit with your primary care physician please Get Medicines reviewed and adjusted.  If you experience worsening of your admission symptoms, develop shortness of breath, life threatening emergency, suicidal or homicidal thoughts you must seek medical attention immediately by calling 911 or calling your MD immediately  if symptoms less severe.  You Must read complete instructions/literature along with all the possible adverse reactions/side effects for all the Medicines you take and that have been prescribed to you. Take any new Medicines after you have completely understood and accpet all the possible adverse reactions/side effects.   Do not drive, operate heavy machinery, perform activities at heights, swimming or participation in water activities or provide baby sitting services if your were admitted for syncope or siezures until you have seen by Primary MD or a Neurologist and advised to do so again.  Do not drive when taking Pain medications.   Procedures/Studies: NM Hepatobiliary Liver Func  Result Date: 07/02/2022 CLINICAL DATA:  Cholelithiasis concern for acute cholecystitis. Elevated LFTs. EXAM: NUCLEAR MEDICINE HEPATOBILIARY  IMAGING TECHNIQUE: Sequential images of the abdomen were obtained out to 60 minutes following intravenous administration of radiopharmaceutical. An additional 30 minutes of scintigraphic imaging was obtained post 3 mg administration of IV morphine. RADIOPHARMACEUTICALS:  6.8 mCi Tc-19m  Choletec IV COMPARISON:  CT abdomen pelvis June 30, 2022 and ultrasound June 29, 2022 FINDINGS: Slightly prolonged radiotracer clearance from blood pool. Relative photopenia of the hepatic dome with delayed hepatocyte radiotracer clearance. Gallbladder activity is not visualized pre or post morphine administration. Biliary activity passes into small bowel, consistent with patent common bile duct. IMPRESSION: 1. Delayed hepatic radiotracer clearance with slightly prolonged clearance from blood pool suggestive of hepatitis. 2. Gallbladder activity is not visualized pre or post morphine  administration as can be seen with acute cholecystitis. However, specificity for acute cholecystitis in the limited setting probable hepatitis. 3. Relative photopenia of the hepatic dome is favored to reflect artifact. Electronically Signed   By: Dahlia Bailiff M.D.   On: 07/02/2022 12:24   CT ABDOMEN PELVIS W CONTRAST  Result Date: 06/30/2022 CLINICAL DATA:  Abdominal pain and diarrhea for 1 day. EXAM: CT ABDOMEN AND PELVIS WITH CONTRAST TECHNIQUE: Multidetector CT imaging of the abdomen and pelvis was performed using the standard protocol following bolus administration of intravenous contrast. RADIATION DOSE REDUCTION: This exam was performed according to the departmental dose-optimization program which includes automated exposure control, adjustment of the mA and/or kV according to patient size and/or use of iterative reconstruction technique. CONTRAST:  86mL OMNIPAQUE IOHEXOL 300 MG/ML  SOLN COMPARISON:  12/05/2016. FINDINGS: Lower chest: The heart is mildly enlarged. Bronchial wall thickening is noted in the lower lobes bilaterally and  scattered airspace opacities are present at the lung bases. Hepatobiliary: No focal liver abnormality is seen. Stones are present in the gallbladder. No biliary ductal dilatation. Pancreas: Unremarkable. No pancreatic ductal dilatation or surrounding inflammatory changes. Spleen: Normal in size without focal abnormality. Adrenals/Urinary Tract: The adrenal glands are within normal limits. A vague hypodensity is present in the upper pole of the left kidney. Renal cortical scarring is present bilaterally. Cysts are noted in the right kidney. No renal calculus or hydronephrosis. The bladder is unremarkable. Stomach/Bowel: Stomach is within normal limits. Appendix appears normal. There is focal dilatation of the duodenum with normal caliber of the remaining small bowel. No focal bowel wall thickening or surrounding inflammatory changes. No free air or pneumatosis. Vascular/Lymphatic: Aortic atherosclerosis. No enlarged abdominal or pelvic lymph nodes. Reproductive: Uterus and bilateral adnexa are unremarkable. Other: No abdominopelvic ascites. Musculoskeletal: Degenerative changes are present in the thoracolumbar spine. There is sclerosis at the sacroiliac joints bilaterally, compatible with sacroiliitis. IMPRESSION: 1. No abnormality to explain patient's reported diarrhea. 2. Bronchial wall thickening in the lower lobes bilaterally with patchy airspace disease at the lung bases, concerning for pneumonia. 3. Cholelithiasis. 4. Vague hypodensity in the upper pole of the left kidney, may represent pyelonephritis in the appropriate clinical setting. 5. Aortic atherosclerosis. Electronically Signed   By: Brett Fairy M.D.   On: 06/30/2022 01:27   US Abdomen Limited  Result Date: 06/29/2022 CLINICAL DATA:  Right upper quadrant pain. EXAM: ULTRASOUND ABDOMEN LIMITED RIGHT UPPER QUADRANT COMPARISON:  12/05/2016. FINDINGS: Gallbladder: Stones are present within the gallbladder. No gallbladder wall thickening. No  sonographic Murphy sign noted by sonographer. Common bile duct: Diameter: 4.1 mm Liver: No focal lesion identified. Within normal limits in parenchymal echogenicity. Portal vein is patent on color Doppler imaging with normal direction of blood flow towards the liver. Other: No ascites. IMPRESSION: Cholelithiasis without acute cholecystitis. Electronically Signed   By: Brett Fairy M.D.   On: 06/29/2022 23:30   DG Chest Port 1 View  Result Date: 06/29/2022 CLINICAL DATA:  Shortness of breath EXAM: PORTABLE CHEST 1 VIEW COMPARISON:  12/01/2016 FINDINGS: Low lung volumes accentuate cardiomediastinal silhouette and pulmonary vascularity. Bibasilar atelectasis. Stable cardiomediastinal silhouette. Tracheostomy. IMPRESSION: Low lung volumes with bibasilar atelectasis. Electronically Signed   By: Placido Sou M.D.   On: 06/29/2022 22:59     The results of significant diagnostics from this hospitalization (including imaging, microbiology, ancillary and laboratory) are listed below for reference.     Microbiology: Recent Results (from the past 240 hour(s))  Culture, blood (Routine x 2)  Status: Abnormal   Collection Time: 06/29/22 10:55 PM   Specimen: BLOOD LEFT WRIST  Result Value Ref Range Status   Specimen Description   Final    BLOOD LEFT WRIST Performed at [facility] [address] [place], [place] [address]    Special Requests   Final    BOTTLES DRAWN AEROBIC AND ANAEROBIC Blood Culture adequate volume Performed at [facility] [address], [place], [place] [address]    Culture  Setup Time   Final    GRAM NEGATIVE RODS IN BOTH AEROBIC AND ANAEROBIC BOTTLES CRITICAL RESULT CALLED TO, READ BACK BY AND VERIFIED WITH: PHARMD MICHELLE LILLISTON ON 06/30/22 @ 2321 BY DRT Performed at [facility], [place] [address], [place], [place] [address]    Culture ENTEROBACTER SPECIES (A)  Final   Report Status 07/04/2022 FINAL  Final   Organism ID,  Bacteria ENTEROBACTER SPECIES  Final      Susceptibility   Enterobacter species - MIC*    CEFEPIME <=0.12 SENSITIVE Sensitive     CEFTAZIDIME <=1 SENSITIVE Sensitive     CEFTRIAXONE <=0.25 SENSITIVE Sensitive     CIPROFLOXACIN <=0.25 SENSITIVE Sensitive     GENTAMICIN <=1 SENSITIVE Sensitive     IMIPENEM 1 SENSITIVE Sensitive     TRIMETH/SULFA <=20 SENSITIVE Sensitive     PIP/TAZO <=4 SENSITIVE Sensitive     * ENTEROBACTER SPECIES  Culture, blood (Routine x 2)     Status: Abnormal   Collection Time: 06/29/22 10:55 PM   Specimen: BLOOD  Result Value Ref Range Status   Specimen Description   Final    BLOOD LEFT ANTECUBITAL Performed at [facility], [place] [address] [place], [place] [address]    Special Requests   Final    BOTTLES DRAWN AEROBIC AND ANAEROBIC Blood Culture results may not be optimal due to an inadequate volume of blood received in culture bottles Performed at [facility] [address], [place], [place] [address]    Culture  Setup Time   Final    GRAM NEGATIVE RODS IN BOTH AEROBIC AND ANAEROBIC BOTTLES CRITICAL RESULT CALLED TO, READ BACK BY AND VERIFIED WITH: [place] CU:[id number] BY JRS Performed at [facility], [place] [address], [place], [place] [address]    Culture KLEBSIELLA PNEUMONIAE (A)  Final   Report Status 07/04/2022 FINAL  Final   Organism ID, Bacteria KLEBSIELLA PNEUMONIAE  Final      Susceptibility   Klebsiella pneumoniae - MIC*    AMPICILLIN RESISTANT Resistant     CEFEPIME <=0.12 SENSITIVE Sensitive     CEFTAZIDIME <=1 SENSITIVE Sensitive     CEFTRIAXONE <=0.25 SENSITIVE Sensitive     CIPROFLOXACIN <=0.25 SENSITIVE Sensitive     GENTAMICIN <=1 SENSITIVE Sensitive     IMIPENEM <=0.25 SENSITIVE Sensitive     TRIMETH/SULFA <=20 SENSITIVE Sensitive     AMPICILLIN/SULBACTAM <=2 SENSITIVE Sensitive     PIP/TAZO <=4 SENSITIVE Sensitive     * KLEBSIELLA PNEUMONIAE  Blood Culture ID Panel (Reflexed)      Status: Abnormal   Collection Time: 06/29/22 10:55 PM  Result Value Ref Range Status   Enterococcus faecalis NOT DETECTED NOT DETECTED Final   Enterococcus Faecium NOT DETECTED NOT DETECTED Final   Listeria monocytogenes NOT DETECTED NOT DETECTED Final   Staphylococcus species NOT DETECTED NOT DETECTED Final   Staphylococcus aureus (BCID) NOT DETECTED NOT DETECTED Final   Staphylococcus epidermidis NOT DETECTED NOT DETECTED  Final   Staphylococcus lugdunensis NOT DETECTED NOT DETECTED Final   Streptococcus species NOT DETECTED NOT DETECTED Final   Streptococcus agalactiae NOT DETECTED NOT DETECTED Final   Streptococcus pneumoniae NOT DETECTED NOT DETECTED Final   Streptococcus pyogenes NOT DETECTED NOT DETECTED Final   A.calcoaceticus-baumannii NOT DETECTED NOT DETECTED Final   Bacteroides fragilis NOT DETECTED NOT DETECTED Final   Enterobacterales DETECTED (A) NOT DETECTED Final    Comment: Enterobacterales represent a large order of gram negative bacteria, not a single organism. CRITICAL RESULT CALLED TO, READ BACK BY AND VERIFIED WITH: PHARMD [place] CU:[id number] BY JRS    Enterobacter cloacae complex NOT DETECTED NOT DETECTED Final   Escherichia coli NOT DETECTED NOT DETECTED Final   Klebsiella aerogenes NOT DETECTED NOT DETECTED Final   Klebsiella oxytoca NOT DETECTED NOT DETECTED Final   Klebsiella pneumoniae DETECTED (A) NOT DETECTED Final    Comment: CRITICAL RESULT CALLED TO, READ BACK BY AND VERIFIED WITH: PHARMD CHRISTINE [place]ADE CU:[id number] BY JRS.    Proteus species NOT DETECTED NOT DETECTED Final   Salmonella species NOT DETECTED NOT DETECTED Final   Serratia marcescens NOT DETECTED NOT DETECTED Final   Haemophilus influenzae NOT DETECTED NOT DETECTED Final   Neisseria meningitidis NOT DETECTED NOT DETECTED Final   Pseudomonas aeruginosa NOT DETECTED NOT DETECTED Final   Stenotrophomonas maltophilia NOT DETECTED NOT DETECTED Final   Candida albicans NOT DETECTED  NOT DETECTED Final   Candida auris NOT DETECTED NOT DETECTED Final   Candida glabrata NOT DETECTED NOT DETECTED Final   Candida krusei NOT DETECTED NOT DETECTED Final   Candida parapsilosis NOT DETECTED NOT DETECTED Final   Candida tropicalis NOT DETECTED NOT DETECTED Final   Cryptococcus neoformans/gattii NOT DETECTED NOT DETECTED Final   CTX-M ESBL NOT DETECTED NOT DETECTED Final   Carbapenem resistance IMP NOT DETECTED NOT DETECTED Final   Carbapenem resistance KPC NOT DETECTED NOT DETECTED Final   Carbapenem resistance NDM NOT DETECTED NOT DETECTED Final   Carbapenem resist OXA 48 LIKE NOT DETECTED NOT DETECTED Final   Carbapenem resistance VIM NOT DETECTED NOT DETECTED Final    Comment: Performed at [facility], [address] [place], [place] [address]  MRSA Next Gen by PCR, Nasal     Status: None   Collection Time: 06/30/22 12:26 PM   Specimen: Nasal Mucosa; Nasal Swab  Result Value Ref Range Status   MRSA by PCR Next Gen NOT DETECTED NOT DETECTED Final    Comment: (NOTE) The GeneXpert MRSA Assay (FDA approved for NASAL specimens only), is one component of a comprehensive MRSA colonization surveillance program. It is not intended to diagnose MRSA infection nor to guide or monitor treatment for MRSA infections. Test performance is not FDA approved in patients less than 103 years old. Performed at [facility], [place] [address], [place], [place] [address]      Labs: BNP (last 3 results) Recent Labs    07/01/22 0830  BNP [id number]   Basic Metabolic Panel: Recent Labs  Lab 07/02/22 0331 07/03/22 0445 07/04/22 0413 07/05/22 0430 07/06/22 0436  NA 136 137 138 138 139  K 3.3* 3.5 3.4* 3.6 3.5  CL 103 103 105 107 106  CO2 25 25 23 22 23   GLUCOSE 128* 120* 93 105* 105*  BUN 10 10 14 16 12   CREATININE 1.10* 1.07* 1.07* 1.10* 0.90  CALCIUM 8.5* 9.4 9.0 9.3 8.9  MG 1.9 2.0 2.1 2.1 1.9   Liver Function Tests: Recent Labs  Lab 07/02/22 0331  07/03/22 0445 07/04/22 0413 07/05/22 0430 07/06/22 0436  AST 83* 71* 54* 53* 40  ALT 121* 103* 82* 69* 54*  ALKPHOS 170* 181* 197* 221* 204*  BILITOT 3.1* 2.5* 2.1* 1.6* 1.3*  PROT 6.8 7.0 6.9 7.2 7.1  ALBUMIN 2.8* 2.9* 2.9* 3.1* 3.0*   Recent Labs  Lab 07/01/22 0830  LIPASE 23   No results for input(s): "AMMONIA" in the last 168 hours. CBC: Recent Labs  Lab 06/29/22 2242 06/30/22 0427 07/01/22 0323 07/02/22 0331 07/03/22 0445 07/04/22 0413 07/05/22 0430 07/06/22 0436  WBC 9.9 31.5*   < > 13.5* 11.1* 13.2* 17.1* 20.0*  NEUTROABS 9.3* 28.7*  --   --   --   --   --   --   HGB 14.0 11.4*   < > 11.4* 12.1 12.3 12.5 11.9*  HCT 40.4 34.3*   < > 33.7* 35.7* 35.4* 37.4 35.9*  MCV 80.6 82.3   < > 82.6 81.0 79.9* 81.8 83.5  PLT 323 257   < > 265 298 294 318 305   < > = values in this interval not displayed.   Cardiac Enzymes: No results for input(s): "CKTOTAL", "CKMB", "CKMBINDEX", "TROPONINI" in the last 168 hours. BNP: Invalid input(s): "POCBNP" CBG: No results for input(s): "GLUCAP" in the last 168 hours. D-Dimer No results for input(s): "DDIMER" in the last 72 hours. Hgb A1c No results for input(s): "HGBA1C" in the last 72 hours. Lipid Profile No results for input(s): "CHOL", "HDL", "LDLCALC", "TRIG", "CHOLHDL", "LDLDIRECT" in the last 72 hours. Thyroid function studies No results for input(s): "TSH", "T4TOTAL", "T3FREE", "THYROIDAB" in the last 72 hours.  Invalid input(s): "FREET3" Anemia work up No results for input(s): "VITAMINB12", "FOLATE", "FERRITIN", "TIBC", "IRON", "RETICCTPCT" in the last 72 hours. Urinalysis    Component Value Date/Time   COLORURINE YELLOW 06/30/2022 0109   APPEARANCEUR CLEAR 06/30/2022 0109   LABSPEC 1.010 06/30/2022 0109   PHURINE 7.0 06/30/2022 0109   GLUCOSEU NEGATIVE 06/30/2022 0109   HGBUR NEGATIVE 06/30/2022 0109   BILIRUBINUR MODERATE (A) 06/30/2022 0109   KETONESUR NEGATIVE 06/30/2022 0109   PROTEINUR 30 (A) 06/30/2022  0109   NITRITE NEGATIVE 06/30/2022 0109   LEUKOCYTESUR NEGATIVE 06/30/2022 0109   Sepsis Labs Recent Labs  Lab 07/03/22 0445 07/04/22 0413 07/05/22 0430 07/06/22 0436  WBC 11.1* 13.2* 17.1* 20.0*   Microbiology Recent Results (from the past 240 hour(s))  Culture, blood (Routine x 2)     Status: Abnormal   Collection Time: 06/29/22 10:55 PM   Specimen: BLOOD LEFT WRIST  Result Value Ref Range Status   Specimen Description   Final    BLOOD LEFT WRIST Performed at [facility], [place] [address] [place], [place] [address]    Special Requests   Final    BOTTLES DRAWN AEROBIC AND ANAEROBIC Blood Culture adequate volume Performed at [facility] [address] [place], [place] [address]    Culture  Setup Time   Final    GRAM NEGATIVE RODS IN BOTH AEROBIC AND ANAEROBIC BOTTLES CRITICAL RESULT CALLED TO, READ BACK BY AND VERIFIED WITH: PHARMD MICHELLE LILLISTON ON 06/30/22 @ 2321 BY DRT Performed at [facility], [place] [address] [place], [place] [address]    Culture ENTEROBACTER SPECIES (A)  Final   Report Status 07/04/2022 FINAL  Final   Organism ID, Bacteria ENTEROBACTER SPECIES  Final      Susceptibility   Enterobacter species - MIC*    CEFEPIME <=  0.12 SENSITIVE Sensitive     CEFTAZIDIME <=1 SENSITIVE Sensitive     CEFTRIAXONE <=0.25 SENSITIVE Sensitive     CIPROFLOXACIN <=0.25 SENSITIVE Sensitive     GENTAMICIN <=1 SENSITIVE Sensitive     IMIPENEM 1 SENSITIVE Sensitive     TRIMETH/SULFA <=20 SENSITIVE Sensitive     PIP/TAZO <=4 SENSITIVE Sensitive     * ENTEROBACTER SPECIES  Culture, blood (Routine x 2)     Status: Abnormal   Collection Time: 06/29/22 10:55 PM   Specimen: BLOOD  Result Value Ref Range Status   Specimen Description   Final    BLOOD LEFT ANTECUBITAL Performed at [facility], [place] [address] [place], [place] [address]    Special Requests   Final    BOTTLES DRAWN AEROBIC AND ANAEROBIC Blood Culture  results may not be optimal due to an inadequate volume of blood received in culture bottles Performed at [place] [address], [place], [place] [address]    Culture  Setup Time   Final    GRAM NEGATIVE RODS IN BOTH AEROBIC AND ANAEROBIC BOTTLES CRITICAL RESULT CALLED TO, READ BACK BY AND VERIFIED WITH: [place] GC:[id number] BY JRS Performed at [facility], [place] [address] [place], [place] [address]    Culture KLEBSIELLA PNEUMONIAE (A)  Final   Report Status 07/04/2022 FINAL  Final   Organism ID, Bacteria KLEBSIELLA PNEUMONIAE  Final      Susceptibility   Klebsiella pneumoniae - MIC*    AMPICILLIN RESISTANT Resistant     CEFEPIME <=0.12 SENSITIVE Sensitive     CEFTAZIDIME <=1 SENSITIVE Sensitive     CEFTRIAXONE <=0.25 SENSITIVE Sensitive     CIPROFLOXACIN <=0.25 SENSITIVE Sensitive     GENTAMICIN <=1 SENSITIVE Sensitive     IMIPENEM <=0.25 SENSITIVE Sensitive     TRIMETH/SULFA <=20 SENSITIVE Sensitive     AMPICILLIN/SULBACTAM <=2 SENSITIVE Sensitive     PIP/TAZO <=4 SENSITIVE Sensitive     * KLEBSIELLA PNEUMONIAE  Blood Culture ID Panel (Reflexed)     Status: Abnormal   Collection Time: 06/29/22 10:55 PM  Result Value Ref Range Status   Enterococcus faecalis NOT DETECTED NOT DETECTED Final   Enterococcus Faecium NOT DETECTED NOT DETECTED Final   Listeria monocytogenes NOT DETECTED NOT DETECTED Final   Staphylococcus species NOT DETECTED NOT DETECTED Final   Staphylococcus aureus (BCID) NOT DETECTED NOT DETECTED Final   Staphylococcus epidermidis NOT DETECTED NOT DETECTED Final   Staphylococcus lugdunensis NOT DETECTED NOT DETECTED Final   Streptococcus species NOT DETECTED NOT DETECTED Final   Streptococcus agalactiae NOT DETECTED NOT DETECTED Final   Streptococcus pneumoniae NOT DETECTED NOT DETECTED Final   Streptococcus pyogenes NOT DETECTED NOT DETECTED Final   A.calcoaceticus-baumannii NOT DETECTED NOT DETECTED Final    Bacteroides fragilis NOT DETECTED NOT DETECTED Final   Enterobacterales DETECTED (A) NOT DETECTED Final    Comment: Enterobacterales represent a large order of gram negative bacteria, not a single organism. CRITICAL RESULT CALLED TO, READ BACK BY AND VERIFIED WITH: PHARMD [place] GC:[id number] BY JRS    Enterobacter cloacae complex NOT DETECTED NOT DETECTED Final   Escherichia coli NOT DETECTED NOT DETECTED Final   Klebsiella aerogenes NOT DETECTED NOT DETECTED Final   Klebsiella oxytoca NOT DETECTED NOT DETECTED Final   Klebsiella pneumoniae DETECTED (A) NOT DETECTED Final    Comment: CRITICAL RESULT CALLED TO, READ BACK BY AND VERIFIED WITH: PHARMD CHRISTINE SHADE GC:[id number] BY JRS.    Proteus  species NOT DETECTED NOT DETECTED Final   Salmonella species NOT DETECTED NOT DETECTED Final   Serratia marcescens NOT DETECTED NOT DETECTED Final   Haemophilus influenzae NOT DETECTED NOT DETECTED Final   Neisseria meningitidis NOT DETECTED NOT DETECTED Final   Pseudomonas aeruginosa NOT DETECTED NOT DETECTED Final   Stenotrophomonas maltophilia NOT DETECTED NOT DETECTED Final   Candida albicans NOT DETECTED NOT DETECTED Final   Candida auris NOT DETECTED NOT DETECTED Final   Candida glabrata NOT DETECTED NOT DETECTED Final   Candida krusei NOT DETECTED NOT DETECTED Final   Candida parapsilosis NOT DETECTED NOT DETECTED Final   Candida tropicalis NOT DETECTED NOT DETECTED Final   Cryptococcus neoformans/gattii NOT DETECTED NOT DETECTED Final   CTX-M ESBL NOT DETECTED NOT DETECTED Final   Carbapenem resistance IMP NOT DETECTED NOT DETECTED Final   Carbapenem resistance KPC NOT DETECTED NOT DETECTED Final   Carbapenem resistance NDM NOT DETECTED NOT DETECTED Final   Carbapenem resist OXA 48 LIKE NOT DETECTED NOT DETECTED Final   Carbapenem resistance VIM NOT DETECTED NOT DETECTED Final    Comment: Performed at [facility], [address], [place], [place] [address]  MRSA Next  Gen by PCR, Nasal     Status: None   Collection Time: 06/30/22 12:26 PM   Specimen: Nasal Mucosa; Nasal Swab  Result Value Ref Range Status   MRSA by PCR Next Gen NOT DETECTED NOT DETECTED Final    Comment: (NOTE) The GeneXpert MRSA Assay (FDA approved for NASAL specimens only), is one component of a comprehensive MRSA colonization surveillance program. It is not intended to diagnose MRSA infection nor to guide or monitor treatment for MRSA infections. Test performance is not FDA approved in patients less than 32 years old. Performed at [facility], [place] [address] [place], [place] [address]      Time coordinating discharge:  I have spent 35 minutes face to face with the patient and on the ward discussing the patients care, assessment, plan and disposition with other care givers. >50% of the time was devoted counseling the patient about the risks and benefits of treatment/Discharge disposition and coordinating care.   SIGNED:   Damita Lack, MD  [facility] 07/06/2022, 1:33 PM   If 7PM-7AM, please contact night-coverage

## 2022-07-06 NOTE — Progress Notes (Signed)
Patient ambulated around 40 feet in hallway at 1200, tolerated well. O2 Sats dropped to 87% RA but quickly increased to 92% RA with rest.  At 1326, patient ambulated around 50 feet in hallway and tolerated well. O2 Sats dropped to 88% RA but quickly increased to 93% RA at rest. O2 Sats 93-94% RA when sitting on edge of bed. Pt using IS frequently. Pt also denies any SOB. MD Reesa Chew made aware- stated to continue with D/C. Patient aware and agrees.

## 2022-07-06 NOTE — Progress Notes (Addendum)
Progress Note  1 Day Post-Op  Subjective: Pt reports some abdominal soreness as expected. Denies nausea or vomiting and tolerating clears. No flatus but has not been ambulating yet. Discussed post-op care and instructions.   Objective: Vital signs in last 24 hours: Temp:  [98 F (36.7 C)-98.4 F (36.9 C)] 98.2 F (36.8 C) (03/19 0622) Pulse Rate:  [80-97] 80 (03/19 0622) Resp:  [16-22] 20 (03/19 0322) BP: (147-179)/(65-86) 147/65 (03/19 0622) SpO2:  [86 %-100 %] 94 % (03/19 0322) FiO2 (%):  [28 %-35 %] 28 % (03/19 0322) Last BM Date : 07/03/22  Intake/Output from previous day: 03/18 0701 - 03/19 0700 In: 1600 [I.V.:1000; IV Piggyback:600] Out: 2020 [Urine:2000; Blood:20] Intake/Output this shift: No intake/output data recorded.  PE: General: pleasant, WD, female who is sitting up in bed in NAD HEENT: Sclera are anicteric Heart: regular, rate, and rhythm.  Lungs: tracheostomy in place, HTC Abd: soft, appropriately ttp, ND, +BS, incisions C/D/I Psych: A&Ox3 with an appropriate affect.   Lab Results:  Recent Labs    07/05/22 0430 07/06/22 0436  WBC 17.1* 20.0*  HGB 12.5 11.9*  HCT 37.4 35.9*  PLT 318 305    BMET Recent Labs    07/05/22 0430 07/06/22 0436  NA 138 139  K 3.6 3.5  CL 107 106  CO2 22 23  GLUCOSE 105* 105*  BUN 16 12  CREATININE 1.10* 0.90  CALCIUM 9.3 8.9    PT/INR No results for input(s): "LABPROT", "INR" in the last 72 hours. CMP     Component Value Date/Time   NA 139 07/06/2022 0436   K 3.5 07/06/2022 0436   CL 106 07/06/2022 0436   CO2 23 07/06/2022 0436   GLUCOSE 105 (H) 07/06/2022 0436   BUN 12 07/06/2022 0436   CREATININE 0.90 07/06/2022 0436   CALCIUM 8.9 07/06/2022 0436   PROT 7.1 07/06/2022 0436   ALBUMIN 3.0 (L) 07/06/2022 0436   AST 40 07/06/2022 0436   ALT 54 (H) 07/06/2022 0436   ALKPHOS 204 (H) 07/06/2022 0436   BILITOT 1.3 (H) 07/06/2022 0436   GFRNONAA >60 07/06/2022 0436   GFRAA 44 (L) 12/05/2016 0947    Lipase     Component Value Date/Time   LIPASE 23 07/01/2022 0830       Studies/Results: No results found.  Anti-infectives: Anti-infectives (From admission, onward)    Start     Dose/Rate Route Frequency Ordered Stop   07/02/22 1600  ceFEPIme (MAXIPIME) 2 g in sodium chloride 0.9 % 100 mL IVPB        2 g 200 mL/hr over 30 Minutes Intravenous Every 8 hours 07/02/22 1432     07/01/22 1600  vancomycin (VANCOREADY) IVPB 1250 mg/250 mL  Status:  Discontinued        1,250 mg 166.7 mL/hr over 90 Minutes Intravenous Every 36 hours 06/30/22 0240 06/30/22 1547   06/30/22 2200  cefTRIAXone (ROCEPHIN) 2 g in sodium chloride 0.9 % 100 mL IVPB  Status:  Discontinued        2 g 200 mL/hr over 30 Minutes Intravenous Every 24 hours 06/30/22 1547 07/02/22 1432   06/30/22 2200  metroNIDAZOLE (FLAGYL) IVPB 500 mg        500 mg 100 mL/hr over 60 Minutes Intravenous Every 12 hours 06/30/22 1547     06/30/22 0800  piperacillin-tazobactam (ZOSYN) IVPB 3.375 g  Status:  Discontinued        3.375 g 12.5 mL/hr over 240 Minutes Intravenous Every 8 hours  06/30/22 0240 06/30/22 1547   06/30/22 0100  piperacillin-tazobactam (ZOSYN) IVPB 3.375 g        3.375 g 100 mL/hr over 30 Minutes Intravenous  Once 06/30/22 0046 06/30/22 0226   06/30/22 0100  vancomycin (VANCOREADY) IVPB 2000 mg/400 mL        2,000 mg 200 mL/hr over 120 Minutes Intravenous  Once 06/30/22 0046 06/30/22 0401        Assessment/Plan Acute Cholecystitis  POD1 s/p laparoscopic cholecystectomy  - Pt appropriately sore - encouraged ambulation - ok to advance to reg diet as tolerated - no further abx needed from an abdominal standpoint but defer to TRH in setting of bacteremia  - clear for discharge from a surgical standpoint once medically cleared, follow up in AVS   FEN: reg diet, SLIV VTE: SQH ID: rocephin 3/13>3/15; flagyl 3/13>>, cefepime 3/15>>   - below per TRH-  Septic shock with Klebsiella and Enterobacterales  bacteremia HTN HLD 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

## 2022-12-28 ENCOUNTER — Other Ambulatory Visit: Payer: Self-pay | Admitting: Family

## 2022-12-28 DIAGNOSIS — Z1231 Encounter for screening mammogram for malignant neoplasm of breast: Secondary | ICD-10-CM
